# Patient Record
Sex: Female | Born: 1985 | Race: White | Hispanic: No | Marital: Married | Smoking: Never smoker
Health system: Southern US, Community
[De-identification: ages and names within clinical notes are randomized; demographics above are authoritative.]

## PROBLEM LIST (undated history)

## (undated) DIAGNOSIS — A6009 Herpesviral infection of other urogenital tract: Secondary | ICD-10-CM

## (undated) DIAGNOSIS — G43909 Migraine, unspecified, not intractable, without status migrainosus: Secondary | ICD-10-CM

## (undated) DIAGNOSIS — J45909 Unspecified asthma, uncomplicated: Secondary | ICD-10-CM

## (undated) DIAGNOSIS — F32A Depression, unspecified: Secondary | ICD-10-CM

## (undated) DIAGNOSIS — T7840XA Allergy, unspecified, initial encounter: Secondary | ICD-10-CM

## (undated) DIAGNOSIS — D696 Thrombocytopenia, unspecified: Secondary | ICD-10-CM

## (undated) DIAGNOSIS — F329 Major depressive disorder, single episode, unspecified: Secondary | ICD-10-CM

## (undated) DIAGNOSIS — F419 Anxiety disorder, unspecified: Secondary | ICD-10-CM

## (undated) HISTORY — DX: Major depressive disorder, single episode, unspecified: F32.9

## (undated) HISTORY — DX: Allergy, unspecified, initial encounter: T78.40XA

## (undated) HISTORY — DX: Herpesviral infection of other urogenital tract: A60.09

## (undated) HISTORY — DX: Thrombocytopenia, unspecified: D69.6

## (undated) HISTORY — DX: Depression, unspecified: F32.A

## (undated) HISTORY — PX: NO PAST SURGERIES: SHX2092

## (undated) HISTORY — DX: Anxiety disorder, unspecified: F41.9

## (undated) HISTORY — DX: Unspecified asthma, uncomplicated: J45.909

## (undated) HISTORY — DX: Migraine, unspecified, not intractable, without status migrainosus: G43.909

---

## 2008-09-16 DIAGNOSIS — A6009 Herpesviral infection of other urogenital tract: Secondary | ICD-10-CM

## 2008-09-16 HISTORY — DX: Herpesviral infection of other urogenital tract: A60.09

## 2011-11-12 ENCOUNTER — Encounter: Payer: Self-pay | Admitting: Physician Assistant

## 2011-11-12 ENCOUNTER — Ambulatory Visit (INDEPENDENT_AMBULATORY_CARE_PROVIDER_SITE_OTHER): Payer: PRIVATE HEALTH INSURANCE | Admitting: Physician Assistant

## 2011-11-12 VITALS — BP 116/68 | HR 72 | Temp 98.1°F | Ht 62.2 in | Wt 145.0 lb

## 2011-11-12 DIAGNOSIS — N63 Unspecified lump in unspecified breast: Secondary | ICD-10-CM

## 2011-11-12 DIAGNOSIS — Z7689 Persons encountering health services in other specified circumstances: Secondary | ICD-10-CM

## 2011-11-12 DIAGNOSIS — L723 Sebaceous cyst: Secondary | ICD-10-CM

## 2011-11-12 DIAGNOSIS — N6313 Unspecified lump in the right breast, lower outer quadrant: Secondary | ICD-10-CM

## 2011-11-12 NOTE — Progress Notes (Signed)
  Subjective:    Patient ID: Tina Wilson, female    DOB: [birth year], 26 y.o.   MRN: [id number]  HPI Patient presents to establish care today. We reviewed patients history today together. Patient has an history of breast lumps and has had an ultrasound last May 2012 which was normal. They instructed her to follow up if breast lumps change or get bigger in size. She believes that the lump has gotten bigger. She denies any pain associated with lump;however, sometimes she does have sharp pains that go through breast spontaneously.   She also has had an history of boils that have needed excision and drainage. She has had 2 other boils and they have both been painful. Today she has a knot that feels like it might be a boil on left arm and she wants to know if there is anthing she can do about it.    Review of Systems     Objective:   Physical Exam  Constitutional: She is oriented to person, place, and time. She appears well-developed and well-nourished.  HENT:  Head: Normocephalic and atraumatic.  Cardiovascular: Normal rate, regular rhythm and normal heart sounds.   Pulmonary/Chest: Effort normal and breath sounds normal. She has no wheezes.       Breast Exam: Right breast lump at 8 o'clock. Left breast no lumps felt.  Neurological: She is alert and oriented to person, place, and time.  Skin:       3mm deep Knot on the back of left arm small punctate with a small amount of honey colored drainage present. No erythema or signs of infection.  Psychiatric: She has a normal mood and affect. Her behavior is normal.          Assessment & Plan:  Breast lump- Will get a U/S scheduled since mammogram is not indicated in women under 30.   Sebaceous cyst- I suspect this is most likely a sebaceous cyst. I told her to allow to drain and keep extringent over where the drainage is coming out. Could consider warm wash rags over the knot. Try to press or mash cyst if there is any infection. Watch out for  redness, tenderness, and other signs of infection. If continues to grow may need to excise.

## 2011-11-14 NOTE — Patient Instructions (Signed)
Will get ultrasound and follow up with results. Watch for signs of infection with cysts. Consider keep site clean and dry.

## 2011-11-27 ENCOUNTER — Ambulatory Visit
Admission: RE | Admit: 2011-11-27 | Discharge: 2011-11-27 | Disposition: A | Discharge status: Home / Self Care | Payer: PRIVATE HEALTH INSURANCE | Source: Ambulatory Visit | Attending: Physician Assistant | Admitting: Physician Assistant

## 2011-11-27 DIAGNOSIS — N6313 Unspecified lump in the right breast, lower outer quadrant: Secondary | ICD-10-CM

## 2012-06-23 ENCOUNTER — Ambulatory Visit (INDEPENDENT_AMBULATORY_CARE_PROVIDER_SITE_OTHER): Payer: PRIVATE HEALTH INSURANCE | Admitting: Family Medicine

## 2012-06-23 VITALS — BP 108/78 | HR 87 | Temp 98.3°F | Resp 16 | Ht 62.75 in | Wt 146.6 lb

## 2012-06-23 DIAGNOSIS — J209 Acute bronchitis, unspecified: Secondary | ICD-10-CM

## 2012-06-23 MED ORDER — AZITHROMYCIN 250 MG PO TABS: ORAL_TABLET | ORAL | Status: DC

## 2012-06-23 MED ORDER — HYDROCOD POLST-CHLORPHEN POLST 10-8 MG/5ML PO LQCR: 5.0000 mL | Freq: Two times a day (BID) | ORAL | Status: DC | PRN

## 2012-06-23 NOTE — Progress Notes (Signed)
[address]   [place], [place]  [address]   [phone]  Subjective:    Patient ID: Tina Wilson, female    DOB: [birth year], 26 y.o.   MRN: [id number]  HPIThis 26 y.o. female presents for evaluation of sore throat, cough.  Onset two weeks ago.  No fever but +chills/sweats.  +HA with coughing.  +ST; feels like lump in throat; no major throat pain with swallowing.  Intermittent pain with swallowing.  No ear pain.  No rhinorrhea; intermittent nasal congestion.  +coughing a lot; +sputum production white.  +SOB.  Mild wheezing; no history of asthma.  No tobacco.  No v/d.  No rash.  Taking Delsym.  No sneezing; no itchy eyes or nose.  No history of seasonal allergies in  since 2005.   History of pneumonia in college; felt worse then than now.  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  Exercise sometimes.  Condoms sporadically; pull out method.     Review of Systems  Constitutional: Negative for fever, chills, diaphoresis and fatigue.  HENT: Positive for congestion, sore throat, voice change and postnasal drip. Negative for rhinorrhea, sneezing, drooling, mouth sores, trouble swallowing, dental problem and sinus pressure.   Respiratory: Positive for cough, shortness of breath and wheezing. Negative for chest tightness and stridor.   Gastrointestinal: Negative for nausea, vomiting and diarrhea.  Skin: Negative for rash.  Neurological: Positive for headaches.        Past Medical History  Diagnosis Date  . Allergy   . Migraine     No past surgical history on file.  Prior to Admission medications   Medication Sig Start Date End Date Taking? Authorizing Provider  azithromycin (ZITHROMAX Z-PAK) 250 MG tablet 2 tablets daily x 1 day then 1 tablet daily x 4 days 06/23/12   Ethelda Chick, MD    chlorpheniramine-HYDROcodone [facility] PENNKINETIC ER) 10-8 MG/5ML LQCR Take 5 mLs by mouth every 12 (twelve) hours as needed. 06/23/12   Ethelda Chick, MD    No Known Allergies  History   Social History  . Marital Status: Married    Spouse Name: N/A    Number of Children: N/A  . Years of Education: N/A   Occupational History  . Not on file.   Social History Main Topics  . Smoking status: Never Smoker   . Smokeless tobacco: Not on file  . Alcohol Use: 10.4 oz/week    4 Drinks containing 0.5 oz of alcohol, 14 Glasses of wine per week  . Drug Use: No  . Sexually Active: Yes -- Female partner(s)     married.   Other Topics Concern  . Not on file   Social History Narrative   Marital status: married   Children: none   Living with: husband   Employment: works from home   Tobacco: none    Alcohol: 2 glasses wine nightly   Drugs: none   Exercise: sporadically    Family History  Problem Relation Age of Onset  . Depression Mother   . Hypertension Father   . Alcohol abuse Paternal Aunt   . Alcohol abuse Maternal Grandmother   . Stroke Maternal Grandmother   . Heart attack Maternal Grandfather   . Breast cancer Paternal Grandmother   . Alcohol abuse Paternal Grandfather     Objective:   Physical  Exam  Nursing note and vitals reviewed. Constitutional: She is oriented to person, place, and time. She appears well-developed and well-nourished. No distress.  HENT:  Head: Normocephalic and atraumatic.  Right Ear: External ear normal.  Left Ear: External ear normal.  Nose: Nose normal.  Mouth/Throat: Oropharynx is clear and moist. No oropharyngeal exudate.  Eyes: Conjunctivae normal and EOM are normal. Pupils are equal, round, and reactive to light.  Neck: Normal range of motion. Neck supple. No thyromegaly present.  Cardiovascular: Normal rate, regular rhythm and normal heart sounds.   Pulmonary/Chest: Effort normal and breath sounds normal. No respiratory distress. She has no  wheezes. She has no rales.       FREQUENT HACKY COUGH DURING VISIT.  Lymphadenopathy:    She has no cervical adenopathy.  Neurological: She is alert and oriented to person, place, and time.  Skin: No rash noted. She is not diaphoretic.  Psychiatric: She has a normal mood and affect. Her behavior is normal.       Assessment & Plan:   1. Acute bronchitis  azithromycin (ZITHROMAX Z-PAK) 250 MG tablet, chlorpheniramine-HYDROcodone (TUSSIONEX PENNKINETIC ER) 10-8 MG/5ML LQCR     1.  Acute bronchitis: New. Rx for Zpack, Tussionex provided. To use Delsym during the day for cough or recommend trial of Mucinex DM every morning. RTC for acute worsening or no improvement. Supportive care with rest, fluids, Ibuprofen or Tylenol.

## 2012-06-23 NOTE — Patient Instructions (Addendum)
1. Acute bronchitis  azithromycin (ZITHROMAX Z-PAK) 250 MG tablet, chlorpheniramine-HYDROcodone (TUSSIONEX PENNKINETIC ER) 10-8 MG/5ML LQCR   Bronchitis Bronchitis is the body's way of reacting to injury and/or infection (inflammation) of the bronchi. Bronchi are the air tubes that extend from the windpipe into the lungs. If the inflammation becomes severe, it may cause shortness of breath. CAUSES  Inflammation may be caused by:  A virus.  Germs (bacteria).  Dust.  Allergens.  Pollutants and many other irritants. The cells lining the bronchial tree are covered with tiny hairs (cilia). These constantly beat upward, away from the lungs, toward the mouth. This keeps the lungs free of pollutants. When these cells become too irritated and are unable to do their job, mucus begins to develop. This causes the characteristic cough of bronchitis. The cough clears the lungs when the cilia are unable to do their job. Without either of these protective mechanisms, the mucus would settle in the lungs. Then you would develop pneumonia. Smoking is a common cause of bronchitis and can contribute to pneumonia. Stopping this habit is the single most important thing you can do to help yourself. TREATMENT   Your caregiver may prescribe an antibiotic if the cough is caused by bacteria. Also, medicines that open up your airways make it easier to breathe. Your caregiver may also recommend or prescribe an expectorant. It will loosen the mucus to be coughed up. Only take over-the-counter or prescription medicines for pain, discomfort, or fever as directed by your caregiver.  Removing whatever causes the problem (smoking, for example) is critical to preventing the problem from getting worse.  Cough suppressants may be prescribed for relief of cough symptoms.  Inhaled medicines may be prescribed to help with symptoms now and to help prevent problems from returning.  For those with recurrent (chronic) bronchitis,  there may be a need for steroid medicines. SEEK IMMEDIATE MEDICAL CARE IF:   During treatment, you develop more pus-like mucus (purulent sputum).  You have a fever.  Your baby is older than 3 months with a rectal temperature of 102 F (38.9 C) or higher.  Your baby is 32 months old or younger with a rectal temperature of 100.4 F (38 C) or higher.  You become progressively more ill.  You have increased difficulty breathing, wheezing, or shortness of breath. It is necessary to seek immediate medical care if you are elderly or sick from any other disease. MAKE SURE YOU:   Understand these instructions.  Will watch your condition.  Will get help right away if you are not doing well or get worse. Document Released: 09/02/2005 Document Revised: 11/25/2011 Document Reviewed: 07/12/2008 [facility] Patient Information 2013 [place], [place].

## 2012-06-24 NOTE — Progress Notes (Signed)
Reviewed and agree.

## 2012-07-17 ENCOUNTER — Telehealth: Payer: Self-pay | Admitting: Family Medicine

## 2012-07-17 ENCOUNTER — Ambulatory Visit (INDEPENDENT_AMBULATORY_CARE_PROVIDER_SITE_OTHER): Payer: PRIVATE HEALTH INSURANCE | Admitting: Emergency Medicine

## 2012-07-17 VITALS — BP 104/70 | HR 91 | Temp 98.1°F | Resp 16 | Ht 63.0 in | Wt 148.0 lb

## 2012-07-17 DIAGNOSIS — L089 Local infection of the skin and subcutaneous tissue, unspecified: Secondary | ICD-10-CM

## 2012-07-17 DIAGNOSIS — J018 Other acute sinusitis: Secondary | ICD-10-CM

## 2012-07-17 DIAGNOSIS — L723 Sebaceous cyst: Secondary | ICD-10-CM

## 2012-07-17 DIAGNOSIS — J329 Chronic sinusitis, unspecified: Secondary | ICD-10-CM

## 2012-07-17 DIAGNOSIS — J209 Acute bronchitis, unspecified: Secondary | ICD-10-CM

## 2012-07-17 MED ORDER — PSEUDOEPHEDRINE-GUAIFENESIN ER 60-600 MG PO TB12: 1.0000 | ORAL_TABLET | Freq: Two times a day (BID) | ORAL | Status: AC

## 2012-07-17 MED ORDER — HYDROCOD POLST-CHLORPHEN POLST 10-8 MG/5ML PO LQCR: 5.0000 mL | Freq: Two times a day (BID) | ORAL | Status: DC | PRN

## 2012-07-17 MED ORDER — AMOXICILLIN-POT CLAVULANATE 875-125 MG PO TABS: 1.0000 | ORAL_TABLET | Freq: Two times a day (BID) | ORAL | Status: DC

## 2012-07-17 NOTE — Telephone Encounter (Signed)
Pt left Rx here. Called in Tussionex 5mL Q12 hours prn cough. Dispense 60mL. 0 refills to [facility] [address] Eileen Stanford

## 2012-07-17 NOTE — Progress Notes (Signed)
   [address] [place] [address]   Phone [phone]  Subjective:    Patient ID: Tina Wilson, female    DOB: 03-06-1986, 26 y.o.   MRN: [id number]  HPI    Review of Systems     Objective:   Physical Exam  Procedure:  Consent obtained.  Local anesthesia with 2% lido with epi.  #11 blade used to make a 3 cm horizontal incision.  Copious purulent sebaceous material expressed.  1/4 in packing placed into the cavity.  Drsg placed. Wound care d/w pt.        Assessment & Plan:  She will change the drsg daily.  She has an option of either pulling out packing tomorrow or RTC on Monday for packing change.  She can use Motrin for pain.

## 2012-07-17 NOTE — Progress Notes (Signed)
[facility] [address] [place] [address] [phone]  Date:  07/17/2012   Name:  Tina Wilson   DOB:  Nov 29, 1985   MRN:  [id number]  PCP:  Nani Gasser, MD    Chief Complaint: Follow-up and Cyst   History of Present Illness:  Tina Wilson is a 26 y.o. very pleasant female patient who presents with the following:  Seen recently with bronchitis and treated with zpak and tussionex.  Completed medication.  Now still has cough.  No nasal drainage or sore throat.  Has a post nasal drainage.  Cough is mostly nonproductive.  Is worse at night when she lays down.  No shortness of breath or wheezing.    Tender mass on posterior right shoulder at site of prior drainage.  Enlarging and tender.  No fever or chills.  There is no problem list on file for this patient.   Past Medical History  Diagnosis Date  . Allergy   . Migraine     No past surgical history on file.  History  Substance Use Topics  . Smoking status: Never Smoker   . Smokeless tobacco: Not on file  . Alcohol Use: 10.4 oz/week    4 Drinks containing 0.5 oz of alcohol, 14 Glasses of wine per week    Family History  Problem Relation Age of Onset  . Depression Mother   . Hypertension Father   . Alcohol abuse Paternal Aunt   . Alcohol abuse Maternal Grandmother   . Stroke Maternal Grandmother   . Heart attack Maternal Grandfather   . Breast cancer Paternal Grandmother   . Alcohol abuse Paternal Grandfather     No Known Allergies  Medication list has been reviewed and updated.  Current Outpatient Prescriptions on File Prior to Visit  Medication Sig Dispense Refill  . azithromycin (ZITHROMAX Z-PAK) 250 MG tablet 2 tablets daily x 1 day then 1 tablet daily x 4 days  6 each  0  . chlorpheniramine-HYDROcodone (TUSSIONEX PENNKINETIC ER) 10-8 MG/5ML LQCR Take 5 mLs by mouth every 12 (twelve) hours as needed.  115 mL  0    Review of Systems:  As per HPI, otherwise negative.     Physical Examination: Filed Vitals:   07/17/12 1444  BP: 104/70  Pulse: 91  Temp: 98.1 F (36.7 C)  Resp: 16   Filed Vitals:   07/17/12 1444  Height: 5\' 3"  (1.6 m)  Weight: 148 lb (67.132 kg)   Body mass index is 26.22 kg/(m^2). Ideal Body Weight: Weight in (lb) to have BMI = 25: 140.8   GEN: WDWN, NAD, Non-toxic, A & O x 3  No rash or sepsis. HEENT: Atraumatic, Normocephalic. Neck supple. No masses, No LAD. Oropharynx negative Ears and Nose: No external deformity.  TM negative CV: RRR, No M/G/R. No JVD. No thrill. No extra heart sounds. PULM: CTA B, no wheezes, crackles, rhonchi. No retractions. No resp. distress. No accessory muscle use. ABD: S, NT, ND, +BS. No rebound. No HSM. EXTR: No c/c/e NEURO Normal gait.  PSYCH: Normally interactive. Conversant. Not depressed or anxious appearing.  Calm demeanor.    Assessment and Plan: Sinusitis Sebaceous cyst augmentin mucinex d  Surgical removal of cyst  Carmelina Dane, MD

## 2012-07-20 ENCOUNTER — Ambulatory Visit (INDEPENDENT_AMBULATORY_CARE_PROVIDER_SITE_OTHER): Payer: PRIVATE HEALTH INSURANCE | Admitting: Family Medicine

## 2012-07-20 VITALS — BP 118/78 | HR 83 | Temp 98.1°F | Resp 18 | Ht 62.0 in | Wt 138.4 lb

## 2012-07-20 DIAGNOSIS — J45909 Unspecified asthma, uncomplicated: Secondary | ICD-10-CM

## 2012-07-20 DIAGNOSIS — L0291 Cutaneous abscess, unspecified: Secondary | ICD-10-CM

## 2012-07-20 DIAGNOSIS — L0390 Cellulitis, unspecified: Secondary | ICD-10-CM

## 2012-07-20 LAB — WOUND CULTURE
Gram Stain: NONE SEEN
Organism ID, Bacteria: NO GROWTH

## 2012-07-20 NOTE — Progress Notes (Signed)
Is a 26 year old woman who works in [employer] for a [employer] and she's here for followup of a cyst on her right shoulder and chronic cough. She's had no further problems with the cyst and needs to have the packing removed. She still has a chronic cough hasn't changed a bit despite the Z-Pak and Augmentin. She has no sinus symptoms at this time.  Patient's had a history of asthma.  Objective: No acute distress the patient is a dry persistent cough  Chest: Clear Packing removed from cyst on back with clean base evident and no pus. There is no significant tenderness or erythema or induration around the area of I&D.  Assessment: Reactive airways disease, healing sebaceous cyst  Plan: Do r1 100 twice a day, stop the Augmentin

## 2012-10-06 ENCOUNTER — Other Ambulatory Visit: Payer: Self-pay | Admitting: Obstetrics & Gynecology

## 2012-10-06 DIAGNOSIS — N6311 Unspecified lump in the right breast, upper outer quadrant: Secondary | ICD-10-CM

## 2012-10-06 DIAGNOSIS — N6321 Unspecified lump in the left breast, upper outer quadrant: Secondary | ICD-10-CM

## 2012-10-06 DIAGNOSIS — N6325 Unspecified lump in the left breast, overlapping quadrants: Secondary | ICD-10-CM

## 2012-10-07 ENCOUNTER — Other Ambulatory Visit: Payer: PRIVATE HEALTH INSURANCE

## 2012-10-09 ENCOUNTER — Inpatient Hospital Stay: Admission: RE | Admit: 2012-10-09 | Payer: PRIVATE HEALTH INSURANCE | Source: Ambulatory Visit

## 2012-10-26 ENCOUNTER — Ambulatory Visit
Admission: RE | Admit: 2012-10-26 | Discharge: 2012-10-26 | Disposition: A | Discharge status: Home / Self Care | Payer: PRIVATE HEALTH INSURANCE | Source: Ambulatory Visit | Attending: Obstetrics & Gynecology | Admitting: Obstetrics & Gynecology

## 2012-10-26 DIAGNOSIS — N6325 Unspecified lump in the left breast, overlapping quadrants: Secondary | ICD-10-CM

## 2012-10-26 DIAGNOSIS — N6311 Unspecified lump in the right breast, upper outer quadrant: Secondary | ICD-10-CM

## 2012-10-26 DIAGNOSIS — N6321 Unspecified lump in the left breast, upper outer quadrant: Secondary | ICD-10-CM

## 2012-10-29 NOTE — Progress Notes (Signed)
Reviewed and agree.

## 2013-01-11 ENCOUNTER — Ambulatory Visit (INDEPENDENT_AMBULATORY_CARE_PROVIDER_SITE_OTHER): Payer: PRIVATE HEALTH INSURANCE | Admitting: Internal Medicine

## 2013-01-11 ENCOUNTER — Encounter: Payer: Self-pay | Admitting: Internal Medicine

## 2013-01-11 ENCOUNTER — Other Ambulatory Visit (INDEPENDENT_AMBULATORY_CARE_PROVIDER_SITE_OTHER): Payer: PRIVATE HEALTH INSURANCE

## 2013-01-11 VITALS — BP 104/68 | HR 81 | Temp 98.1°F | Resp 16 | Ht 62.0 in | Wt 153.5 lb

## 2013-01-11 DIAGNOSIS — Z0000 Encounter for general adult medical examination without abnormal findings: Secondary | ICD-10-CM

## 2013-01-11 LAB — COMPREHENSIVE METABOLIC PANEL
BUN: 11 mg/dL (ref 6–23)
CO2: 28 mEq/L (ref 19–32)
Calcium: 9 mg/dL (ref 8.4–10.5)
Chloride: 104 mEq/L (ref 96–112)
Creatinine, Ser: 0.6 mg/dL (ref 0.4–1.2)
GFR: 128.7 mL/min (ref >60.00)
Total Bilirubin: 0.7 mg/dL (ref 0.3–1.2)

## 2013-01-11 LAB — LIPID PANEL
Cholesterol: 198 mg/dL (ref 0–200)
HDL: 48.1 mg/dL (ref >39.00)
LDL Cholesterol: 129 mg/dL — ABNORMAL HIGH (ref 0–99)
Triglycerides: 103 mg/dL (ref 0.0–149.0)

## 2013-01-11 LAB — CBC WITH DIFFERENTIAL/PLATELET
Basophils Absolute: 0 10*3/uL (ref 0.0–0.1)
Basophils Relative: 0.5 % (ref 0.0–3.0)
Eosinophils Relative: 1.3 % (ref 0.0–5.0)
HCT: 35.2 % — ABNORMAL LOW (ref 36.0–46.0)
Hemoglobin: 12.5 g/dL (ref 12.0–15.0)
Lymphocytes Relative: 33.4 % (ref 12.0–46.0)
Monocytes Relative: 7.6 % (ref 3.0–12.0)
Neutro Abs: 3.7 10*3/uL (ref 1.4–7.7)
RBC: 3.91 Mil/uL (ref 3.87–5.11)
WBC: 6.5 10*3/uL (ref 4.5–10.5)

## 2013-01-11 NOTE — Patient Instructions (Signed)
Preventive Care for Adults, Female A healthy lifestyle and preventive care can promote health and wellness. Preventive health guidelines for women include the following key practices.  A routine yearly physical is a good way to check with your caregiver about your health and preventive screening. It is a chance to share any concerns and updates on your health, and to receive a thorough exam.  Visit your dentist for a routine exam and preventive care every 6 months. Brush your teeth twice a day and floss once a day. Good oral hygiene prevents tooth decay and gum disease.  The frequency of eye exams is based on your age, health, family medical history, use of contact lenses, and other factors. Follow your caregiver's recommendations for frequency of eye exams.  Eat a healthy diet. Foods like vegetables, fruits, whole grains, low-fat dairy products, and lean protein foods contain the nutrients you need without too many calories. Decrease your intake of foods high in solid fats, added sugars, and salt. Eat the right amount of calories for you.Get information about a proper diet from your caregiver, if necessary.  Regular physical exercise is one of the most important things you can do for your health. Most adults should get at least 150 minutes of moderate-intensity exercise (any activity that increases your heart rate and causes you to sweat) each week. In addition, most adults need muscle-strengthening exercises on 2 or more days a week.  Maintain a healthy weight. The body mass index (BMI) is a screening tool to identify possible weight problems. It provides an estimate of body fat based on height and weight. Your caregiver can help determine your BMI, and can help you achieve or maintain a healthy weight.For adults 20 years and older:  A BMI below 18.5 is considered underweight.  A BMI of 18.5 to 24.9 is normal.  A BMI of 25 to 29.9 is considered overweight.  A BMI of 30 and above is  considered obese.  Maintain normal blood lipids and cholesterol levels by exercising and minimizing your intake of saturated fat. Eat a balanced diet with plenty of fruit and vegetables. Blood tests for lipids and cholesterol should begin at age 20 and be repeated every 5 years. If your lipid or cholesterol levels are high, you are over 50, or you are at high risk for heart disease, you may need your cholesterol levels checked more frequently.Ongoing high lipid and cholesterol levels should be treated with medicines if diet and exercise are not effective.  If you smoke, find out from your caregiver how to quit. If you do not use tobacco, do not start.  If you are pregnant, do not drink alcohol. If you are breastfeeding, be very cautious about drinking alcohol. If you are not pregnant and choose to drink alcohol, do not exceed 1 drink per day. One drink is considered to be 12 ounces (355 mL) of beer, 5 ounces (148 mL) of wine, or 1.5 ounces (44 mL) of liquor.  Avoid use of street drugs. Do not share needles with anyone. Ask for help if you need support or instructions about stopping the use of drugs.  High blood pressure causes heart disease and increases the risk of stroke. Your blood pressure should be checked at least every 1 to 2 years. Ongoing high blood pressure should be treated with medicines if weight loss and exercise are not effective.  If you are 55 to 26 years old, ask your caregiver if you should take aspirin to prevent strokes.  Diabetes   screening involves taking a blood sample to check your fasting blood sugar level. This should be done once every 3 years, after age 45, if you are within normal weight and without risk factors for diabetes. Testing should be considered at a younger age or be carried out more frequently if you are overweight and have at least 1 risk factor for diabetes.  Breast cancer screening is essential preventive care for women. You should practice "breast  self-awareness." This means understanding the normal appearance and feel of your breasts and may include breast self-examination. Any changes detected, no matter how small, should be reported to a caregiver. Women in their 20s and 30s should have a clinical breast exam (CBE) by a caregiver as part of a regular health exam every 1 to 3 years. After age 40, women should have a CBE every year. Starting at age 40, women should consider having a mammography (breast X-ray test) every year. Women who have a family history of breast cancer should talk to their caregiver about genetic screening. Women at a high risk of breast cancer should talk to their caregivers about having magnetic resonance imaging (MRI) and a mammography every year.  The Pap test is a screening test for cervical cancer. A Pap test can show cell changes on the cervix that might become cervical cancer if left untreated. A Pap test is a procedure in which cells are obtained and examined from the lower end of the uterus (cervix).  Women should have a Pap test starting at age 21.  Between ages 21 and 29, Pap tests should be repeated every 2 years.  Beginning at age 30, you should have a Pap test every 3 years as long as the past 3 Pap tests have been normal.  Some women have medical problems that increase the chance of getting cervical cancer. Talk to your caregiver about these problems. It is especially important to talk to your caregiver if a new problem develops soon after your last Pap test. In these cases, your caregiver may recommend more frequent screening and Pap tests.  The above recommendations are the same for women who have or have not gotten the vaccine for human papillomavirus (HPV).  If you had a hysterectomy for a problem that was not cancer or a condition that could lead to cancer, then you no longer need Pap tests. Even if you no longer need a Pap test, a regular exam is a good idea to make sure no other problems are  starting.  If you are between ages 65 and 70, and you have had normal Pap tests going back 10 years, you no longer need Pap tests. Even if you no longer need a Pap test, a regular exam is a good idea to make sure no other problems are starting.  If you have had past treatment for cervical cancer or a condition that could lead to cancer, you need Pap tests and screening for cancer for at least 20 years after your treatment.  If Pap tests have been discontinued, risk factors (such as a new sexual partner) need to be reassessed to determine if screening should be resumed.  The HPV test is an additional test that may be used for cervical cancer screening. The HPV test looks for the virus that can cause the cell changes on the cervix. The cells collected during the Pap test can be tested for HPV. The HPV test could be used to screen women aged 30 years and older, and should   be used in women of any age who have unclear Pap test results. After the age of 30, women should have HPV testing at the same frequency as a Pap test.  Colorectal cancer can be detected and often prevented. Most routine colorectal cancer screening begins at the age of 50 and continues through age 75. However, your caregiver may recommend screening at an earlier age if you have risk factors for colon cancer. On a yearly basis, your caregiver may provide home test kits to check for hidden blood in the stool. Use of a small camera at the end of a tube, to directly examine the colon (sigmoidoscopy or colonoscopy), can detect the earliest forms of colorectal cancer. Talk to your caregiver about this at age 50, when routine screening begins. Direct examination of the colon should be repeated every 5 to 10 years through age 75, unless early forms of pre-cancerous polyps or small growths are found.  Hepatitis C blood testing is recommended for all people born from 1945 through 1965 and any individual with known risks for hepatitis C.  Practice  safe sex. Use condoms and avoid high-risk sexual practices to reduce the spread of sexually transmitted infections (STIs). STIs include gonorrhea, chlamydia, syphilis, trichomonas, herpes, HPV, and human immunodeficiency virus (HIV). Herpes, HIV, and HPV are viral illnesses that have no cure. They can result in disability, cancer, and death. Sexually active women aged 25 and younger should be checked for chlamydia. Older women with new or multiple partners should also be tested for chlamydia. Testing for other STIs is recommended if you are sexually active and at increased risk.  Osteoporosis is a disease in which the bones lose minerals and strength with aging. This can result in serious bone fractures. The risk of osteoporosis can be identified using a bone density scan. Women ages 65 and over and women at risk for fractures or osteoporosis should discuss screening with their caregivers. Ask your caregiver whether you should take a calcium supplement or vitamin D to reduce the rate of osteoporosis.  Menopause can be associated with physical symptoms and risks. Hormone replacement therapy is available to decrease symptoms and risks. You should talk to your caregiver about whether hormone replacement therapy is right for you.  Use sunscreen with sun protection factor (SPF) of 30 or more. Apply sunscreen liberally and repeatedly throughout the day. You should seek shade when your shadow is shorter than you. Protect yourself by wearing long sleeves, pants, a wide-brimmed hat, and sunglasses year round, whenever you are outdoors.  Once a month, do a whole body skin exam, using a mirror to look at the skin on your back. Notify your caregiver of new moles, moles that have irregular borders, moles that are larger than a pencil eraser, or moles that have changed in shape or color.  Stay current with required immunizations.  Influenza. You need a dose every fall (or winter). The composition of the flu vaccine  changes each year, so being vaccinated once is not enough.  Pneumococcal polysaccharide. You need 1 to 2 doses if you smoke cigarettes or if you have certain chronic medical conditions. You need 1 dose at age 65 (or older) if you have never been vaccinated.  Tetanus, diphtheria, pertussis (Tdap, Td). Get 1 dose of Tdap vaccine if you are younger than age 65, are over 65 and have contact with an infant, are a healthcare worker, are pregnant, or simply want to be protected from whooping cough. After that, you need a Td   booster dose every 10 years. Consult your caregiver if you have not had at least 3 tetanus and diphtheria-containing shots sometime in your life or have a deep or dirty wound.  HPV. You need this vaccine if you are a woman age 26 or younger. The vaccine is given in 3 doses over 6 months.  Measles, mumps, rubella (MMR). You need at least 1 dose of MMR if you were born in 1957 or later. You may also need a second dose.  Meningococcal. If you are age 19 to 21 and a first-year college student living in a residence hall, or have one of several medical conditions, you need to get vaccinated against meningococcal disease. You may also need additional booster doses.  Zoster (shingles). If you are age 60 or older, you should get this vaccine.  Varicella (chickenpox). If you have never had chickenpox or you were vaccinated but received only 1 dose, talk to your caregiver to find out if you need this vaccine.  Hepatitis A. You need this vaccine if you have a specific risk factor for hepatitis A virus infection or you simply wish to be protected from this disease. The vaccine is usually given as 2 doses, 6 to 18 months apart.  Hepatitis B. You need this vaccine if you have a specific risk factor for hepatitis B virus infection or you simply wish to be protected from this disease. The vaccine is given in 3 doses, usually over 6 months. Preventive Services / Frequency Ages 19 to 39  Blood  pressure check.** / Every 1 to 2 years.  Lipid and cholesterol check.** / Every 5 years beginning at age 20.  Clinical breast exam.** / Every 3 years for women in their 20s and 30s.  Pap test.** / Every 2 years from ages 21 through 29. Every 3 years starting at age 30 through age 65 or 70 with a history of 3 consecutive normal Pap tests.  HPV screening.** / Every 3 years from ages 30 through ages 65 to 70 with a history of 3 consecutive normal Pap tests.  Hepatitis C blood test.** / For any individual with known risks for hepatitis C.  Skin self-exam. / Monthly.  Influenza immunization.** / Every year.  Pneumococcal polysaccharide immunization.** / 1 to 2 doses if you smoke cigarettes or if you have certain chronic medical conditions.  Tetanus, diphtheria, pertussis (Tdap, Td) immunization. / A one-time dose of Tdap vaccine. After that, you need a Td booster dose every 10 years.  HPV immunization. / 3 doses over 6 months, if you are 26 and younger.  Measles, mumps, rubella (MMR) immunization. / You need at least 1 dose of MMR if you were born in 1957 or later. You may also need a second dose.  Meningococcal immunization. / 1 dose if you are age 19 to 21 and a first-year college student living in a residence hall, or have one of several medical conditions, you need to get vaccinated against meningococcal disease. You may also need additional booster doses.  Varicella immunization.** / Consult your caregiver.  Hepatitis A immunization.** / Consult your caregiver. 2 doses, 6 to 18 months apart.  Hepatitis B immunization.** / Consult your caregiver. 3 doses usually over 6 months. Ages 40 to 64  Blood pressure check.** / Every 1 to 2 years.  Lipid and cholesterol check.** / Every 5 years beginning at age 20.  Clinical breast exam.** / Every year after age 40.  Mammogram.** / Every year beginning at age 40   and continuing for as long as you are in good health. Consult with your  caregiver.  Pap test.** / Every 3 years starting at age 30 through age 65 or 70 with a history of 3 consecutive normal Pap tests.  HPV screening.** / Every 3 years from ages 30 through ages 65 to 70 with a history of 3 consecutive normal Pap tests.  Fecal occult blood test (FOBT) of stool. / Every year beginning at age 50 and continuing until age 75. You may not need to do this test if you get a colonoscopy every 10 years.  Flexible sigmoidoscopy or colonoscopy.** / Every 5 years for a flexible sigmoidoscopy or every 10 years for a colonoscopy beginning at age 50 and continuing until age 75.  Hepatitis C blood test.** / For all people born from 1945 through 1965 and any individual with known risks for hepatitis C.  Skin self-exam. / Monthly.  Influenza immunization.** / Every year.  Pneumococcal polysaccharide immunization.** / 1 to 2 doses if you smoke cigarettes or if you have certain chronic medical conditions.  Tetanus, diphtheria, pertussis (Tdap, Td) immunization.** / A one-time dose of Tdap vaccine. After that, you need a Td booster dose every 10 years.  Measles, mumps, rubella (MMR) immunization. / You need at least 1 dose of MMR if you were born in 1957 or later. You may also need a second dose.  Varicella immunization.** / Consult your caregiver.  Meningococcal immunization.** / Consult your caregiver.  Hepatitis A immunization.** / Consult your caregiver. 2 doses, 6 to 18 months apart.  Hepatitis B immunization.** / Consult your caregiver. 3 doses, usually over 6 months. Ages 65 and over  Blood pressure check.** / Every 1 to 2 years.  Lipid and cholesterol check.** / Every 5 years beginning at age 20.  Clinical breast exam.** / Every year after age 40.  Mammogram.** / Every year beginning at age 40 and continuing for as long as you are in good health. Consult with your caregiver.  Pap test.** / Every 3 years starting at age 30 through age 65 or 70 with a 3  consecutive normal Pap tests. Testing can be stopped between 65 and 70 with 3 consecutive normal Pap tests and no abnormal Pap or HPV tests in the past 10 years.  HPV screening.** / Every 3 years from ages 30 through ages 65 or 70 with a history of 3 consecutive normal Pap tests. Testing can be stopped between 65 and 70 with 3 consecutive normal Pap tests and no abnormal Pap or HPV tests in the past 10 years.  Fecal occult blood test (FOBT) of stool. / Every year beginning at age 50 and continuing until age 75. You may not need to do this test if you get a colonoscopy every 10 years.  Flexible sigmoidoscopy or colonoscopy.** / Every 5 years for a flexible sigmoidoscopy or every 10 years for a colonoscopy beginning at age 50 and continuing until age 75.  Hepatitis C blood test.** / For all people born from 1945 through 1965 and any individual with known risks for hepatitis C.  Osteoporosis screening.** / A one-time screening for women ages 65 and over and women at risk for fractures or osteoporosis.  Skin self-exam. / Monthly.  Influenza immunization.** / Every year.  Pneumococcal polysaccharide immunization.** / 1 dose at age 65 (or older) if you have never been vaccinated.  Tetanus, diphtheria, pertussis (Tdap, Td) immunization. / A one-time dose of Tdap vaccine if you are over   65 and have contact with an infant, are a healthcare worker, or simply want to be protected from whooping cough. After that, you need a Td booster dose every 10 years.  Varicella immunization.** / Consult your caregiver.  Meningococcal immunization.** / Consult your caregiver.  Hepatitis A immunization.** / Consult your caregiver. 2 doses, 6 to 18 months apart.  Hepatitis B immunization.** / Check with your caregiver. 3 doses, usually over 6 months. ** Family history and personal history of risk and conditions may change your caregiver's recommendations. Document Released: 10/29/2001 Document Revised: 11/25/2011  Document Reviewed: 01/28/2011 [employer] Patient Information 2013 [employer].  

## 2013-01-11 NOTE — Assessment & Plan Note (Signed)
Exam done Vaccines were reviewed Labs ordered Pt ed material was given 

## 2013-01-11 NOTE — Progress Notes (Signed)
  Subjective:    Patient ID: Tina Wilson, female    DOB: 10-19-1985, 27 y.o.   MRN: [id number]  HPI  New to me for a physical - she feels well and offers no complaints.  Review of Systems  Constitutional: Negative.   HENT: Negative.   Eyes: Negative.   Respiratory: Negative.   Cardiovascular: Negative.   Gastrointestinal: Negative.   Endocrine: Negative.   Genitourinary: Negative.   Musculoskeletal: Negative.   Skin: Negative.   Allergic/Immunologic: Negative.   Neurological: Negative.   Hematological: Negative.   Psychiatric/Behavioral: Negative.        Objective:   Physical Exam  Vitals reviewed. Constitutional: She is oriented to person, place, and time. She appears well-developed and well-nourished. No distress.  HENT:  Head: Normocephalic and atraumatic.  Mouth/Throat: Oropharynx is clear and moist. No oropharyngeal exudate.  Eyes: Conjunctivae are normal. Right eye exhibits no discharge. Left eye exhibits no discharge. No scleral icterus.  Neck: Normal range of motion. Neck supple. No JVD present. No tracheal deviation present. No thyromegaly present.  Cardiovascular: Normal rate, regular rhythm, normal heart sounds and intact distal pulses.  Exam reveals no gallop and no friction rub.   No murmur heard. Pulmonary/Chest: Effort normal and breath sounds normal. No stridor. No respiratory distress. She has no wheezes. She has no rales. She exhibits no tenderness.  Abdominal: Soft. Bowel sounds are normal. She exhibits no distension and no mass. There is no tenderness. There is no rebound and no guarding.  Musculoskeletal: Normal range of motion. She exhibits no edema and no tenderness.  Lymphadenopathy:    She has no cervical adenopathy.  Neurological: She is oriented to person, place, and time.  Skin: Skin is warm and dry. No rash noted. She is not diaphoretic. No erythema. No pallor.  Psychiatric: She has a normal mood and affect. Her behavior is normal. Judgment and  thought content normal.          Assessment & Plan:

## 2015-07-14 ENCOUNTER — Other Ambulatory Visit: Payer: Self-pay | Admitting: Obstetrics and Gynecology

## 2015-07-14 DIAGNOSIS — N63 Unspecified lump in unspecified breast: Secondary | ICD-10-CM

## 2015-07-20 ENCOUNTER — Ambulatory Visit
Admission: RE | Admit: 2015-07-20 | Discharge: 2015-07-20 | Disposition: A | Discharge status: Home / Self Care | Payer: PRIVATE HEALTH INSURANCE | Source: Ambulatory Visit | Attending: Obstetrics and Gynecology | Admitting: Obstetrics and Gynecology

## 2015-07-20 DIAGNOSIS — N63 Unspecified lump in unspecified breast: Secondary | ICD-10-CM

## 2016-03-15 LAB — OB RESULTS CONSOLE ANTIBODY SCREEN: ANTIBODY SCREEN: NEGATIVE

## 2016-03-15 LAB — OB RESULTS CONSOLE RPR: RPR: NONREACTIVE

## 2016-03-15 LAB — OB RESULTS CONSOLE RUBELLA ANTIBODY, IGM: RUBELLA: IMMUNE

## 2016-03-15 LAB — OB RESULTS CONSOLE HEPATITIS B SURFACE ANTIGEN: Hepatitis B Surface Ag: NEGATIVE

## 2016-03-15 LAB — OB RESULTS CONSOLE ABO/RH: RH Type: POSITIVE

## 2016-03-15 LAB — OB RESULTS CONSOLE HIV ANTIBODY (ROUTINE TESTING): HIV: NONREACTIVE

## 2016-03-15 LAB — OB RESULTS CONSOLE GC/CHLAMYDIA
CHLAMYDIA, DNA PROBE: NEGATIVE
GC PROBE AMP, GENITAL: NEGATIVE

## 2016-10-22 ENCOUNTER — Inpatient Hospital Stay (HOSPITAL_COMMUNITY): Admission: AD | Admit: 2016-10-22 | Payer: 59 | Source: Ambulatory Visit | Admitting: Obstetrics and Gynecology

## 2016-10-28 ENCOUNTER — Encounter (HOSPITAL_COMMUNITY): Payer: Self-pay | Admitting: *Deleted

## 2016-10-28 ENCOUNTER — Telehealth (HOSPITAL_COMMUNITY): Payer: Self-pay | Admitting: *Deleted

## 2016-10-28 LAB — OB RESULTS CONSOLE GBS: STREP GROUP B AG: NEGATIVE

## 2016-10-28 NOTE — Telephone Encounter (Signed)
Preadmission screen  

## 2016-10-29 NOTE — H&P (Signed)
Tina Wilson is a 31 y.o. female presenting for post dates IOL. U/S in office 10/28/16 showed vertex, EFW 7# 13 oz, AFI 6.5 and BPP 8/8. OB History    Gravida Para Term Preterm AB Living   1             SAB TAB Ectopic Multiple Live Births                 Past Medical History:  Diagnosis Date  . Allergy   . Anxiety   . Asthma   . Depression   . Herpes genitalis in women 2010  . Migraine    Past Surgical History:  Procedure Laterality Date  . NO PAST SURGERIES     Family History: family history includes Alcohol abuse in her maternal grandmother, paternal aunt, and paternal grandfather; Arthritis in her mother; Breast cancer in her paternal grandmother; Depression in her mother; Heart attack in her maternal grandfather; Hyperlipidemia in her father; Hypertension in her father; Stroke in her maternal grandmother. Social History:  reports that she quit smoking about 8 years ago. She has never used smokeless tobacco. She reports that she drinks about 3.0 oz of alcohol per week . She reports that she does not use drugs.     Maternal Diabetes: No Genetic Screening: Normal Maternal Ultrasounds/Referrals: Normal Fetal Ultrasounds or other Referrals:  None Maternal Substance Abuse:  No Significant Maternal Medications:  None Significant Maternal Lab Results:  None Other Comments:  None  ROS History   There were no vitals taken for this visit. Maternal Exam:  Abdomen: Fetal presentation: vertex     Physical Exam  Cardiovascular: Normal rate.   Respiratory: Effort normal.  GI: Soft.    Prenatal labs: ABO, Rh: O/Positive/-- (06/30 0000) Antibody: Negative (06/30 0000) Rubella: Immune (06/30 0000) RPR: Nonreactive (06/30 0000)  HBsAg: Negative (06/30 0000)  HIV: Non-reactive (06/30 0000)  GBS: Negative (02/12 0000)   Assessment/Plan: 31 yo G1P0 for two stage inductio of labor Induction D/W patient in office   Tina Wilson II,Tina Wilson 10/29/2016, 1:56 PM

## 2016-10-30 ENCOUNTER — Inpatient Hospital Stay (HOSPITAL_COMMUNITY): Payer: BLUE CROSS/BLUE SHIELD | Admitting: Anesthesiology

## 2016-10-30 ENCOUNTER — Encounter (HOSPITAL_COMMUNITY): Payer: Self-pay

## 2016-10-30 ENCOUNTER — Inpatient Hospital Stay (HOSPITAL_COMMUNITY)
Admission: RE | Admit: 2016-10-30 | Discharge: 2016-11-01 | DRG: 775 | Disposition: A | Discharge status: Home / Self Care | Payer: BLUE CROSS/BLUE SHIELD | Source: Ambulatory Visit | Attending: Obstetrics and Gynecology | Admitting: Obstetrics and Gynecology

## 2016-10-30 DIAGNOSIS — Z87891 Personal history of nicotine dependence: Secondary | ICD-10-CM

## 2016-10-30 DIAGNOSIS — Z823 Family history of stroke: Secondary | ICD-10-CM | POA: Diagnosis not present

## 2016-10-30 DIAGNOSIS — Z3A41 41 weeks gestation of pregnancy: Secondary | ICD-10-CM

## 2016-10-30 DIAGNOSIS — Z8249 Family history of ischemic heart disease and other diseases of the circulatory system: Secondary | ICD-10-CM | POA: Diagnosis not present

## 2016-10-30 DIAGNOSIS — O480 Post-term pregnancy: Secondary | ICD-10-CM | POA: Diagnosis present

## 2016-10-30 LAB — CBC
HCT: 35.5 % — ABNORMAL LOW (ref 36.0–46.0)
HEMOGLOBIN: 12.4 g/dL (ref 12.0–15.0)
MCH: 32 pg (ref 26.0–34.0)
MCHC: 34.9 g/dL (ref 30.0–36.0)
MCV: 91.5 fL (ref 78.0–100.0)
PLATELETS: 139 10*3/uL — AB (ref 150–400)
RBC: 3.88 MIL/uL (ref 3.87–5.11)
RDW: 14 % (ref 11.5–15.5)
WBC: 10.9 10*3/uL — ABNORMAL HIGH (ref 4.0–10.5)

## 2016-10-30 LAB — TYPE AND SCREEN
ABO/RH(D): O POS
Antibody Screen: NEGATIVE

## 2016-10-30 LAB — POCT FERN TEST: POCT FERN TEST: POSITIVE

## 2016-10-30 LAB — ABO/RH: ABO/RH(D): O POS

## 2016-10-30 LAB — RPR: RPR: NONREACTIVE

## 2016-10-30 MED ORDER — EPHEDRINE 5 MG/ML INJ
10.0000 mg | INTRAVENOUS | Status: DC | PRN
  Filled 2016-10-30: qty 4

## 2016-10-30 MED ORDER — SOD CITRATE-CITRIC ACID 500-334 MG/5ML PO SOLN: 30.0000 mL | ORAL | Status: DC | PRN

## 2016-10-30 MED ORDER — OXYTOCIN 40 UNITS IN LACTATED RINGERS INFUSION - SIMPLE MED: 2.5000 [IU]/h | INTRAVENOUS | Status: DC

## 2016-10-30 MED ORDER — LACTATED RINGERS IV SOLN: 500.0000 mL | INTRAVENOUS | Status: DC | PRN

## 2016-10-30 MED ORDER — LIDOCAINE HCL (PF) 1 % IJ SOLN
30.0000 mL | INTRAMUSCULAR | Status: DC | PRN
  Filled 2016-10-30: qty 30

## 2016-10-30 MED ORDER — TERBUTALINE SULFATE 1 MG/ML IJ SOLN
0.2500 mg | Freq: Once | INTRAMUSCULAR | Status: DC | PRN
  Filled 2016-10-30: qty 1

## 2016-10-30 MED ORDER — FLEET ENEMA 7-19 GM/118ML RE ENEM: 1.0000 | ENEMA | RECTAL | Status: DC | PRN

## 2016-10-30 MED ORDER — OXYCODONE-ACETAMINOPHEN 5-325 MG PO TABS: 1.0000 | ORAL_TABLET | ORAL | Status: DC | PRN

## 2016-10-30 MED ORDER — LACTATED RINGERS IV SOLN
500.0000 mL | Freq: Once | INTRAVENOUS | Status: AC
  Administered 2016-10-30: 500 mL via INTRAVENOUS

## 2016-10-30 MED ORDER — OXYTOCIN 40 UNITS IN LACTATED RINGERS INFUSION - SIMPLE MED
1.0000 m[IU]/min | INTRAVENOUS | Status: DC
  Administered 2016-10-30: 2 m[IU]/min via INTRAVENOUS
  Filled 2016-10-30: qty 1000

## 2016-10-30 MED ORDER — BUTORPHANOL TARTRATE 1 MG/ML IJ SOLN
2.0000 mg | Freq: Once | INTRAMUSCULAR | Status: AC
  Administered 2016-10-30: 2 mg via INTRAVENOUS
  Filled 2016-10-30: qty 2

## 2016-10-30 MED ORDER — TERBUTALINE SULFATE 1 MG/ML IJ SOLN: 0.2500 mg | Freq: Once | INTRAMUSCULAR | Status: DC | PRN

## 2016-10-30 MED ORDER — OXYCODONE-ACETAMINOPHEN 5-325 MG PO TABS: 2.0000 | ORAL_TABLET | ORAL | Status: DC | PRN

## 2016-10-30 MED ORDER — SODIUM BICARBONATE 8.4 % IV SOLN: INTRAVENOUS | Status: DC | PRN

## 2016-10-30 MED ORDER — FENTANYL 2.5 MCG/ML BUPIVACAINE 1/10 % EPIDURAL INFUSION (WH - ANES)
14.0000 mL/h | INTRAMUSCULAR | Status: DC | PRN
  Administered 2016-10-30 (×4): 14 mL/h via EPIDURAL
  Filled 2016-10-30 (×2): qty 100

## 2016-10-30 MED ORDER — OXYTOCIN BOLUS FROM INFUSION
500.0000 mL | Freq: Once | INTRAVENOUS | Status: AC
  Administered 2016-10-30: 500 mL via INTRAVENOUS

## 2016-10-30 MED ORDER — LIDOCAINE HCL (PF) 1 % IJ SOLN
INTRAMUSCULAR | Status: DC | PRN
  Administered 2016-10-30: 6 mL via EPIDURAL
  Administered 2016-10-30: 4 mL

## 2016-10-30 MED ORDER — ZOLPIDEM TARTRATE 5 MG PO TABS
5.0000 mg | ORAL_TABLET | Freq: Every evening | ORAL | Status: DC | PRN
  Administered 2016-10-30: 5 mg via ORAL
  Filled 2016-10-30: qty 1

## 2016-10-30 MED ORDER — ONDANSETRON HCL 4 MG/2ML IJ SOLN
4.0000 mg | Freq: Four times a day (QID) | INTRAMUSCULAR | Status: DC | PRN
  Administered 2016-10-30 (×2): 4 mg via INTRAVENOUS
  Filled 2016-10-30 (×2): qty 2

## 2016-10-30 MED ORDER — PHENYLEPHRINE 40 MCG/ML (10ML) SYRINGE FOR IV PUSH (FOR BLOOD PRESSURE SUPPORT)
80.0000 ug | PREFILLED_SYRINGE | INTRAVENOUS | Status: DC | PRN
  Filled 2016-10-30: qty 5

## 2016-10-30 MED ORDER — LACTATED RINGERS IV SOLN
INTRAVENOUS | Status: DC
  Administered 2016-10-30 (×2): via INTRAVENOUS

## 2016-10-30 MED ORDER — ACETAMINOPHEN 325 MG PO TABS: 650.0000 mg | ORAL_TABLET | ORAL | Status: DC | PRN

## 2016-10-30 MED ORDER — METHYLERGONOVINE MALEATE 0.2 MG/ML IJ SOLN
INTRAMUSCULAR | Status: AC
  Administered 2016-10-30: 0.2 mg
  Filled 2016-10-30: qty 1

## 2016-10-30 MED ORDER — MISOPROSTOL 25 MCG QUARTER TABLET
25.0000 ug | ORAL_TABLET | ORAL | Status: DC | PRN
  Administered 2016-10-30: 25 ug via VAGINAL
  Filled 2016-10-30: qty 0.25
  Filled 2016-10-30: qty 1

## 2016-10-30 MED ORDER — DIPHENHYDRAMINE HCL 50 MG/ML IJ SOLN: 12.5000 mg | INTRAMUSCULAR | Status: DC | PRN

## 2016-10-30 NOTE — Progress Notes (Signed)
9/C/-1 FHT cat one, good response to scalp stim

## 2016-10-30 NOTE — Progress Notes (Signed)
Operative Delivery Note At 10:17 PM a viable female was delivered via Vaginal, Vacuum Neurosurgeon).  Presentation: vertex; Position: Left,, Occiput,, Anterior; Station: +4. Pushing with good effort, beginning to crown. Fetal tachycardia 160-170s noted and patient afebrile. Patient tired. D/W VE. Verbal consent: obtained from patient.  Risks and benefits discussed in detail.  Risks include, but are not limited to the risks of anesthesia, bleeding, infection, damage to maternal tissues, fetal cephalhematoma.  There is also the risk of inability to effect vaginal delivery of the head, or shoulder dystocia that cannot be resolved by established maneuvers, leading to the need for emergency cesarean section. Mushroom VE, 1 popoff. Delivery over second degree MLE. Nuchal cord x 1. Shoulder dystocia <30 seconds, McRoberts position, suprapubic pressure, mild traction. APGAR:6 , 8; weight pending  .   Placenta status:intact , .   Cord:  3 vessels with the following complications: .  Cord pH: pending  Anesthesia:   Instruments: mushroom VE Episiotomy:  Second degree MLE repaired Lacerations:   Suture Repair: 3.0 vicryl rapide Est. Blood Loss (mL):  400 Methergine IM x 1  Mom to postpartum.  Baby to Couplet care / Skin to Skin.  Tina Wilson,Tiesha Marich E 10/30/2016, 10:36 PM

## 2016-10-30 NOTE — Progress Notes (Signed)
Cx 2/C/-1 FHT cat one UCs q2-3 min IUPC placed Epidural in

## 2016-10-30 NOTE — Progress Notes (Signed)
Cx 5/C/-1 FHT cat one

## 2016-10-30 NOTE — Anesthesia Pain Management Evaluation Note (Signed)
  CRNA Pain Management Visit Note  Patient: Tina Wilson, 31 y.o., female  "Hello I am a member of the anesthesia team at [facility]. We have an anesthesia team available at all times to provide care throughout the hospital, including epidural management and anesthesia for C-section. I don't know your plan for the delivery whether it a natural birth, water birth, IV sedation, nitrous supplementation, doula or epidural, but we want to meet your pain goals."   1.Was your pain managed to your expectations on prior hospitalizations?   No prior hospitalizations  2.What is your expectation for pain management during this hospitalization?     Epidural  3.How can we help you reach that goal?   Record the patient's initial score and the patient's pain goal.   Pain: 0  Pain Goal: 8 [facility] wants you to be able to say your pain was always managed very well.  Jabier Mutton 10/30/2016

## 2016-10-30 NOTE — Progress Notes (Signed)
FHT cat one UCs q2-3 mn Cx 2/C/-2

## 2016-10-30 NOTE — Progress Notes (Signed)
cx 6/C/-1 FHT cat one, good response to scalp stim

## 2016-10-30 NOTE — Progress Notes (Signed)
C/O N/V - patient reports long history of HA and N/V  FHT + accels, some variable decels UCs MVU 190 Cx 4/puffy/-2/some caput  D/W patient and husband above Will medicate nause Recheck about 1 hour

## 2016-10-30 NOTE — Anesthesia Procedure Notes (Signed)
Epidural Patient location during procedure: OB  Staffing Anesthesiologist: Freida Nebel  Preanesthetic Checklist Completed: patient identified, site marked, surgical consent, pre-op evaluation, timeout performed, IV checked, risks and benefits discussed and monitors and equipment checked  Epidural Patient position: sitting Prep: site prepped and draped and DuraPrep Patient monitoring: continuous pulse ox and blood pressure Approach: midline Location: L3-L4 Injection technique: LOR air  Needle:  Needle type: Tuohy  Needle gauge: 17 G Needle length: 9 cm and 9 Needle insertion depth: 5 cm cm Catheter type: closed end flexible Catheter size: 19 Gauge Catheter at skin depth: 10 cm Test dose: negative  Assessment Events: blood not aspirated, injection not painful, no injection resistance, negative IV test and no paresthesia  Additional Notes Dosing of Epidural:  1st dose, through catheter .............................................  Xylocaine 40 mg  2nd dose, through catheter, after waiting 3 minutes.........Xylocaine 60 mg    As each dose occurred, patient was free of IV sx; and patient exhibited no evidence of SA injection.  Patient is more comfortable after epidural dosed. Please see RN's note for documentation of vital signs,and FHR which are stable.  Patient reminded not to try to ambulate with numb legs, and that an RN must be present when she attempts to get up.        

## 2016-10-30 NOTE — Anesthesia Preprocedure Evaluation (Signed)

## 2016-10-31 LAB — CBC
HEMATOCRIT: 29.7 % — AB (ref 36.0–46.0)
Hemoglobin: 10.8 g/dL — ABNORMAL LOW (ref 12.0–15.0)
MCH: 33 pg (ref 26.0–34.0)
MCHC: 36.4 g/dL — ABNORMAL HIGH (ref 30.0–36.0)
MCV: 90.8 fL (ref 78.0–100.0)
PLATELETS: 125 10*3/uL — AB (ref 150–400)
RBC: 3.27 MIL/uL — ABNORMAL LOW (ref 3.87–5.11)
RDW: 14.2 % (ref 11.5–15.5)
WBC: 19.8 10*3/uL — ABNORMAL HIGH (ref 4.0–10.5)

## 2016-10-31 MED ORDER — TETANUS-DIPHTH-ACELL PERTUSSIS 5-2.5-18.5 LF-MCG/0.5 IM SUSP: 0.5000 mL | Freq: Once | INTRAMUSCULAR | Status: DC

## 2016-10-31 MED ORDER — OXYCODONE HCL 5 MG PO TABS: 10.0000 mg | ORAL_TABLET | ORAL | Status: DC | PRN

## 2016-10-31 MED ORDER — WITCH HAZEL-GLYCERIN EX PADS
1.0000 "application " | MEDICATED_PAD | CUTANEOUS | Status: DC | PRN
  Administered 2016-10-31: 1 via TOPICAL

## 2016-10-31 MED ORDER — PRENATAL MULTIVITAMIN CH
1.0000 | ORAL_TABLET | Freq: Every day | ORAL | Status: DC
  Administered 2016-10-31 – 2016-11-01 (×2): 1 via ORAL
  Filled 2016-10-31 (×2): qty 1

## 2016-10-31 MED ORDER — SENNOSIDES-DOCUSATE SODIUM 8.6-50 MG PO TABS
2.0000 | ORAL_TABLET | ORAL | Status: DC
  Administered 2016-11-01: 2 via ORAL
  Filled 2016-10-31: qty 2

## 2016-10-31 MED ORDER — ONDANSETRON HCL 4 MG/2ML IJ SOLN: 4.0000 mg | INTRAMUSCULAR | Status: DC | PRN

## 2016-10-31 MED ORDER — DIBUCAINE 1 % RE OINT
1.0000 "application " | TOPICAL_OINTMENT | RECTAL | Status: DC | PRN
  Administered 2016-10-31: 1 via RECTAL
  Filled 2016-10-31: qty 28

## 2016-10-31 MED ORDER — IBUPROFEN 600 MG PO TABS
600.0000 mg | ORAL_TABLET | Freq: Four times a day (QID) | ORAL | Status: DC
  Administered 2016-10-31 – 2016-11-01 (×6): 600 mg via ORAL
  Filled 2016-10-31 (×7): qty 1

## 2016-10-31 MED ORDER — ACETAMINOPHEN 325 MG PO TABS
650.0000 mg | ORAL_TABLET | ORAL | Status: DC | PRN
  Administered 2016-10-31: 650 mg via ORAL
  Filled 2016-10-31 (×2): qty 2

## 2016-10-31 MED ORDER — ZOLPIDEM TARTRATE 5 MG PO TABS: 5.0000 mg | ORAL_TABLET | Freq: Every evening | ORAL | Status: DC | PRN

## 2016-10-31 MED ORDER — DIPHENHYDRAMINE HCL 25 MG PO CAPS: 25.0000 mg | ORAL_CAPSULE | Freq: Four times a day (QID) | ORAL | Status: DC | PRN

## 2016-10-31 MED ORDER — OXYCODONE HCL 5 MG PO TABS: 5.0000 mg | ORAL_TABLET | ORAL | Status: DC | PRN

## 2016-10-31 MED ORDER — COCONUT OIL OIL: 1.0000 "application " | TOPICAL_OIL | Status: DC | PRN

## 2016-10-31 MED ORDER — SIMETHICONE 80 MG PO CHEW: 80.0000 mg | CHEWABLE_TABLET | ORAL | Status: DC | PRN

## 2016-10-31 MED ORDER — ONDANSETRON HCL 4 MG PO TABS: 4.0000 mg | ORAL_TABLET | ORAL | Status: DC | PRN

## 2016-10-31 MED ORDER — BENZOCAINE-MENTHOL 20-0.5 % EX AERO
1.0000 "application " | INHALATION_SPRAY | CUTANEOUS | Status: DC | PRN
  Administered 2016-10-31: 1 via TOPICAL
  Filled 2016-10-31: qty 56

## 2016-10-31 NOTE — Lactation Note (Signed)
This note was copied from a baby's chart. Lactation Consultation Note  Patient Name: Tina Wilson [id number] Date: 10/31/2016 Reason for consult: Follow-up assessment;Difficult latch   Follow up with mom at her request. Infant quietly alert STS with FOB. Parents report they attempted to feed infant and she would not latch.   Assisted mom in latching infant to right breast in the cross cradle hold. Infant latched with flanged lips, rhythmic suckles and frequent swallows. Infant fed for 5 minutes, burped and then relatched again for another 5 minutes. Infant STS with mom post BF. Mom reports no pain/pinching with feeding, nipple rounded when infant came off.   Showed parents teacup hold and enc dad to assist mom with latch. Enc parents to call out for feeding assistance as needed.    Maternal Data Formula Feeding for Exclusion: No Has patient been taught Hand Expression?: Yes Does the patient have breastfeeding experience prior to this delivery?: No  Feeding Feeding Type: Breast Fed Length of feed: 10 min  LATCH Score/Interventions Latch: Grasps breast easily, tongue down, lips flanged, rhythmical sucking. Intervention(s): Adjust position;Assist with latch;Breast massage;Breast compression  Audible Swallowing: Spontaneous and intermittent Intervention(s): Alternate breast massage;Hand expression;Skin to skin  Type of Nipple: Everted at rest and after stimulation (short shaft)  Comfort (Breast/Nipple): Filling, red/small blisters or bruises, mild/mod discomfort  Problem noted: Mild/Moderate discomfort Interventions  (Cracked/bleeding/bruising/blister): Expressed breast milk to nipple Interventions (Mild/moderate discomfort): Hand expression (deepen latch)  Hold (Positioning): Assistance needed to correctly position infant at breast and maintain latch. Intervention(s): Skin to skin;Position options;Support Pillows;Breastfeeding basics reviewed  LATCH Score:  8  Lactation Tools Discussed/Used WIC Program: No   Consult Status Consult Status: Follow-up Date: 11/01/16 Follow-up type: In-patient    Debby Freiberg Karyl Sharrar 10/31/2016, 2:11 PM

## 2016-10-31 NOTE — Anesthesia Postprocedure Evaluation (Signed)
Anesthesia Post Note  Patient: Tina Wilson  Procedure(s) Performed: * No procedures listed *  Patient location during evaluation: Mother Baby Anesthesia Type: Epidural Level of consciousness: awake and alert and oriented Pain management: satisfactory to patient Vital Signs Assessment: post-procedure vital signs reviewed and stable Respiratory status: spontaneous breathing and nonlabored ventilation Cardiovascular status: stable Postop Assessment: no headache, no backache, no signs of nausea or vomiting, adequate PO intake and patient able to bend at knees (patient up walking) Anesthetic complications: no        Last Vitals:  Vitals:   10/31/16 0218 10/31/16 0537  BP:  108/64  Pulse:  92  Resp:  18  Temp: 36.8 C 36.4 C    Last Pain:  Vitals:   10/31/16 0537  TempSrc: Oral  PainSc: 1    Pain Goal:                 Willa Rough

## 2016-10-31 NOTE — Lactation Note (Signed)
This note was copied from a baby's chart. Lactation Consultation Note  Patient Name: Tina Wilson [id number] Date: 10/31/2016 Reason for consult: Initial assessment;Breast/nipple pain   Initial consult with first time mom of 71 hour old infant. Mom reports she is having trouble getting infant to latch and that infant will only feed for short periods of time and will not relatch. Infant STS with mom and quietly alert.  Mom with large compressible breasts and semi compressible areola with short shaft everted nipples. Colostrum very easily expressible, Mom able to hand express colostrum independently. Faint positional stripes noted to both nipples. Assisted mom in latching infant to left breast in the cross cradle hold. Infant did not open mouth very wide and was difficult to latch. She did latch and was noted to be pinching mom, removed her from the breast and nipple was slightly compressed. Infant is noted to have a high palate, she extended her tongue well. She did not suckle well on a gloved finger. Infant would not relatch to breast. Hand expressed and spoon fed infant 3 cc colostrum, at the end of the feeding she was extending tongue and lapping up colostrum, She was not interested in relatching.  Enc mom to keep infant STS and to offer breast 8-12 x in 24 hours at first feeding cues. Enc mom to hand express and spoon feed infant colostrum if infant will not latch. Parents were shown how to spoon feed infant. Mom applied EBM to breast post BF. Enc parents to call out for feeding assistance as needed. Parents are maintaining feeding log. Infant has stooled and not voided since birth.  BF Resources Handout and [employer]ure given, informed of IP/OP Services, BF Support Groups and [place] phone #. Report to Lady Deutscher, RN and Inocente Salles, RN, IBCLC.     Maternal Data Formula Feeding for Exclusion: No Has patient been taught Hand Expression?: Yes Does the patient have breastfeeding  experience prior to this delivery?: No  Feeding Feeding Type: Breast Fed Length of feed: 5 min  LATCH Score/Interventions Latch: Repeated attempts needed to sustain latch, nipple held in mouth throughout feeding, stimulation needed to elicit sucking reflex. Intervention(s): Adjust position;Assist with latch;Breast massage;Breast compression  Audible Swallowing: A few with stimulation Intervention(s): Hand expression;Alternate breast massage;Skin to skin  Type of Nipple: Everted at rest and after stimulation  Comfort (Breast/Nipple): Filling, red/small blisters or bruises, mild/mod discomfort  Problem noted: Mild/Moderate discomfort;Cracked, bleeding, blisters, bruises Interventions  (Cracked/bleeding/bruising/blister): Expressed breast milk to nipple Interventions (Mild/moderate discomfort): Hand expression (deepen latch)  Hold (Positioning): Assistance needed to correctly position infant at breast and maintain latch. Intervention(s): Breastfeeding basics reviewed;Support Pillows;Position options;Skin to skin  LATCH Score: 6  Lactation Tools Discussed/Used WIC Program: No   Consult Status Consult Status: Follow-up Date: 11/01/16 Follow-up type: In-patient    Debby Freiberg Kasper Mudrick 10/31/2016, 11:09 AM

## 2016-10-31 NOTE — Progress Notes (Signed)
Post Partum Day 1 Subjective: no complaints, up ad lib, voiding and tolerating PO  Objective: Blood pressure 108/64, pulse 92, temperature 97.5 F (36.4 C), temperature source Oral, resp. rate 18, height 5\' 2"  (1.575 m), weight 175 lb (79.4 kg), last menstrual period 10/22/2016, SpO2 100 %, unknown if currently breastfeeding.  Physical Exam:  General: alert and cooperative Lochia: appropriate Uterine Fundus: firm Incision: healing well DVT Evaluation: No evidence of DVT seen on physical exam. Negative Homan's sign. No cords or calf tenderness. No significant calf/ankle edema.   Recent Labs  10/30/16 0059 10/31/16 0605  HGB 12.4 10.8*  HCT 35.5* 29.7*    Assessment/Plan: Plan for discharge tomorrow and Breastfeeding   LOS: 1 day   CURTIS,CAROL G 10/31/2016, 7:57 AM

## 2016-11-01 NOTE — Plan of Care (Signed)
Problem: Education: Goal: Knowledge of condition will improve Outcome: Completed/Met Date Met: 11/01/16 Spoke with mother about breast care, latching the infant, hand expression, and positioning the infant for feedings.

## 2016-11-01 NOTE — Lactation Note (Signed)
This note was copied from a baby's chart. Lactation Consultation Note  Patient Name: Tina Wilson [id number] Date: 11/01/2016 Reason for consult: Follow-up assessment Baby cluster feeding this am. Mom has baby latched in cradle position and baby does not have good depth. Worked with Mom with positioning to obtain more depth. Baby demonstrated good suckling bursts with swallowing motions noted. Mom reported stronger "tugging" at breast. Mom not using nipple shield and nipple is erect/compressible.  Reviewed importance of positioning and deep latch to milk transfer. Encouraged Mom to continue to post pump for 15 minutes and give baby back any amount of colostrum received. Per chart review baby has been to breast 11 times in past 24 hours 5-20 minutes, average 10-15 minutes, 2 voids/0 stools in past 24 hours, 2 voids/3stools in life. Mom has supplemented 3 ml of colostrum 1 time, 5 ml of colostrum 2 times - spoon feeding. Was on d/c list today but waiting on another stool before decision to d/c is made per parents. Basic teaching reviewed with parents. Advised baby should be at breast 8-12 times in 24 hours and with feeding ques. Reviewed expected output. Baby now 3 hours old. 3% weight loss. Encouraged Mom to call with next feeding for LC to assess latch/milk transfer at breast.   Maternal Data    Feeding Feeding Type: Breast Fed Length of feed: 8 min  LATCH Score/Interventions Latch: Grasps breast easily, tongue down, lips flanged, rhythmical sucking. Intervention(s): Adjust position;Breast massage;Assist with latch;Breast compression  Audible Swallowing: A few with stimulation Intervention(s): Skin to skin;Hand expression  Type of Nipple: Everted at rest and after stimulation  Comfort (Breast/Nipple): Soft / non-tender  Problem noted: Mild/Moderate discomfort Interventions  (Cracked/bleeding/bruising/blister): Expressed breast milk to nipple Interventions (Mild/moderate  discomfort): Hand expression  Hold (Positioning): Assistance needed to correctly position infant at breast and maintain latch. Intervention(s): Breastfeeding basics reviewed;Support Pillows;Position options;Skin to skin  LATCH Score: 8  Lactation Tools Discussed/Used Tools: Pump Breast pump type: Double-Electric Breast Pump   Consult Status Consult Status: Follow-up Date: 11/01/16 Follow-up type: In-patient    Katrine Coho 11/01/2016, 11:04 AM

## 2016-11-01 NOTE — Discharge Summary (Signed)
Obstetric Discharge Summary Reason for Admission: induction of labor Prenatal Procedures: ultrasound Intrapartum Procedures: vacuum Postpartum Procedures: none Complications-Operative and Postpartum: 2 degree perineal laceration Hemoglobin  Date Value Ref Range Status  10/31/2016 10.8 (L) 12.0 - 15.0 g/dL Final   HCT  Date Value Ref Range Status  10/31/2016 29.7 (L) 36.0 - 46.0 % Final    Physical Exam:  General: alert and cooperative Lochia: appropriate Uterine Fundus: firm Incision: healing well DVT Evaluation: No evidence of DVT seen on physical exam. Negative Homan's sign. No cords or calf tenderness. No significant calf/ankle edema.  Discharge Diagnoses: Term Pregnancy-delivered  Discharge Information: Date: 11/01/2016 Activity: pelvic rest Diet: routine Medications: PNV and Ibuprofen Condition: stable Instructions: refer to practice specific booklet Discharge to: home   Newborn Data: Live born female  Birth Weight: 7 lb 12.5 oz (3530 g) APGAR: 6, 8  Home with mother.  Horice Carrero G 11/01/2016, 8:22 AM

## 2016-11-01 NOTE — Progress Notes (Signed)
RN alerted CSW to hx of Anxiety and Depression.  CSW has not received consult during MOB's admission.  CSW reviewed chart and notes hx is in [employer].  CSW will not complete assessment unless MOB requests.

## 2016-11-01 NOTE — Lactation Note (Signed)
This note was copied from a baby's chart. Lactation Consultation Note  Patient Name: Girl Bannie Khan [id number] Date: 11/01/2016 Reason for consult: Follow-up assessment  Baby 67 hours old. Mom called for assessment with latch. Baby at left breast, latched deeply with lips flanged and suckling rhythmically--with some swallows noted. Enc mom to place baby's hands in such a way that the baby is hugging the breast so that the baby can transfer milk more easily. Discussed progression of milk coming to volume and enc mom to offer lots of STS and attempts at the breast.   Maternal Data    Feeding Feeding Type: Breast Fed Length of feed: 13 min  LATCH Score/Interventions Latch: Grasps breast easily, tongue down, lips flanged, rhythmical sucking. Intervention(s): Adjust position;Breast massage;Assist with latch;Breast compression  Audible Swallowing: A few with stimulation Intervention(s): Skin to skin;Hand expression  Type of Nipple: Everted at rest and after stimulation  Comfort (Breast/Nipple): Soft / non-tender  Problem noted: Mild/Moderate discomfort Interventions  (Cracked/bleeding/bruising/blister): Expressed breast milk to nipple Interventions (Mild/moderate discomfort): Hand expression  Hold (Positioning): Assistance needed to correctly position infant at breast and maintain latch. Intervention(s): Position options  LATCH Score: 8  Lactation Tools Discussed/Used Tools: Pump Breast pump type: Double-Electric Breast Pump   Consult Status Consult Status: Follow-up Date: 11/02/16 Follow-up type: In-patient    Andres Labrum 11/01/2016, 2:04 PM

## 2016-11-04 ENCOUNTER — Ambulatory Visit (HOSPITAL_COMMUNITY): Payer: BLUE CROSS/BLUE SHIELD

## 2016-11-04 ENCOUNTER — Ambulatory Visit (HOSPITAL_COMMUNITY)
Admission: RE | Admit: 2016-11-04 | Discharge: 2016-11-04 | Disposition: A | Discharge status: Home / Self Care | Payer: BLUE CROSS/BLUE SHIELD | Source: Ambulatory Visit | Attending: Obstetrics and Gynecology | Admitting: Obstetrics and Gynecology

## 2016-11-04 DIAGNOSIS — Z029 Encounter for administrative examinations, unspecified: Secondary | ICD-10-CM | POA: Diagnosis present

## 2016-11-04 NOTE — Lactation Note (Signed)
Lactation Consult - 89 day old  baby Tina Wilson and mom Tina Wilson and dad present for 3:30 pm appt..  Per mom milk came in yesterday without engorgement. Baby cluster fed all night and I was exhausted. Baby last fed at 2:30 pm left breast 7 mins , right 13 mins.  Sore nipples at 1st and have improved.  1st baby . Baby has been cluster feeding about every 1 hours at night and every 2-3 hours days and evenings. For 20 mins at a time. 9 wets , 6 mustard seedy stools.  Per mom has a [employer] used it occasionally until milk came for some 10 mins post pumping with #24 flange and it seemed to be a good fit.   Mother's reason for visit: per mom recently had a baby and want to make sure I'm doing this 100%  Visit Type: feeding assessment  Appointment Notes: Pt. Confirmed , discharged on Friday 2/16 - baby is wanting to latch every 45 mins to an hour. Does not seem content and has not slept much Consult:  Initial Lactation Consultant:  Tina Wilson  ________________________________________________________________________ Tina Wilson Name:  Tina Wilson Date of Birth:  10/30/2015 Pediatrician:  Dr. Elnita Wilson  Gender:  female Gestational Age: [redacted]w[redacted]d (At Birth) Birth Weight:  7 lb 12.5 oz (3530 g) Weight at Discharge:  Weight: 7 lb 8.6 oz (3420 g)               Date of Discharge:  11/01/2016     [facility] Weights   10/30/16 2217 11/01/16 0000  Weight: 7 lb 12.5 oz (3530 g) 7 lb 8.6 oz (3420 g)  Last weight taken from location outside of [facility]: 2/19 - 7-6 oz      Location:Pediatrician's office Weight today: 7-7.1 oz , 3376 g  ___________________________________________________________________  Mother's Name: Tina Wilson Type of delivery:  Vaginal  Breastfeeding Experience: 1st baby  Maternal Medical Conditions:  No risk for milk supply . Mom does have hx of asthma, anxiety / depression  Maternal Medications:  PNV ,    ________________________________________________________________________  Breastfeeding History (Post - see above note  Discharge)____________________________________________________________________ Maternal Breast Assessment  Breast:  Full Nipple:  Erect ( appear healthy ) compressible areolas bilaterally  Pain level:  0 Pain interventions:  Expressed breast milk  _______________________________________________________________________ Feeding Assessment/Evaluation  Initial feeding assessment:  Infant's oral assessment: LC assessed baby's mouth with gloved fingers , noted a high palate,  Upper labial frenulum short just above the gum line, upper lip stretches well with exam and when latched both breast. Baby able to lift tongue upward, and extend a short distance over gum line  Positioning: 1st latch left  breast /  Football / football  - Latch score 8 , latched with assistance to obtain depth and to show mom what depth at the latch looked like. Nutritive feeding pattern noted and per mom comfortable.  LC had to flip upper lip to flanged position. Baby fed 12 mins , and became non - nutritive so LC had mom release suction and nipple well rounded. Tools:  None for latch  IPre-feed weight: 3376 g , 7-7.1 oz  Post-feed weight: 3396 g , 7-7.8 oz  Amount transferred: 20 ml  Amount supplemented:  None   Additional Feeding Assessment -   Positioning:  2nd latch Cross cradle  / right  breast / - Latch score 8 , latched with assistance for depth. Nutritive  sucking pattern for only 10 mins , and became non - nutritive and LC had mom release suction. Nipple well rounded when baby was released and per mom the latch was comfortable.  Pre-feed weight: 3396 g , 7-7.8 oz  Post-feed weight:  3406 g , 7-8.2 oz  Amount transferred:  10 ml  Amount supplemented: none   Additional feeding :   Positioning: 3rd latch / left /crosscradle / Latch score 9 , latched better with depth, nutritive sucking  pattern/ and fed 10 mins / and released. Nipple well rounded and per mom comfortable latch.  Pre - feed weight: 3406 g , 7-8.2 oz  Post- feed weight: 3412 g , 7-8.4 oz  Amount transferred: 6 ml  Amount supplemented: none   Positioning:  4 th latch right breast/ football/ Latch score 10/ mom independent with latch and depth at the breast . Per mom comfortable and baby fed 12 plus minutes and became non - nutritive , mom released suction, nipple well rounded.  Pre  - feed weight: 3412g , 7-8.4 oz  Post - feed weight: 3426 g , 7-8.8 oz  Amount transferred: 14 ml   Lactation Impression:  After baby was to the breast 4 x;s was finally satisfied. Mom arrived very tired and LC suspects due to be exhausted is not letting down well. ( [place] encouraged naps / and rest , plenty fluids and calories ) see LC plan for details . LC had to review basics several times.  Infant's oral assessment: LC assessed baby's mouth with gloved fingers , noted a high palate,  Upper labial frenulum short just above the gum line, upper lip stretches well with exam and when latched both breast. Baby able to lift tongue upward, and extend a short distance over gum line  Total amount pumped post feed:  Did not post pump   Total amount transferred:  50 ml ( for 5 day old adequate amount )  Total supplement given:  None  Lactation Plan of care :  Praised mom for her efforts breast feeding and dad for being supportive  Feedings - days/ evenings 2-2 1/2 hours - offer breast STS  Nights by 3 hours and with feeding cues  Growth Spurts - 7-10 days , 3 weeks , 6 weeks , cluster feedings normal  Important if breast are full ( areola ) hand express , or pre pump hand pump of DEBP 2-3 mins . So areola is compressible for the baby's mouth.  Engorgement prevention and tx reviewed.

## 2018-06-22 NOTE — Progress Notes (Signed)
Subjective:    Patient ID: Tina Wilson, female    DOB: [birth year], 32 y.o.   MRN: [id number]  HPI:  [name] is here to establish as a new pt.  She is a pleasant 32 year old female. PMH: Sig hx of Migraine without aura since highschool. She reports extensive neurological work-up  20 years ago to include MRI imaging and "food elimination program".  She was successfully treated Fioricet throughout her teens and twenties.  She reports complete resolution of migraine's during pregnancy and has not resumed rx. She now estimates 3 "regular HA days/week" and 1 migraine every 1-2 weeks. She denies aura with migraine but will experience will photosensitivity, sound-sensitivity, and sig pain for hours. She estimates to drink >100 oz water a day and tries to follow a heart healthy diet, however her husband is a chef/baker so she will indulge in what he makes from time to time. She denies tobacco/vape use She walks daily and hopes to increase more "vigorous exercise". She has an 45 month old daughter "Tina Wilson" She reports intermittent insomnia, often r/t to nocturia due to her excellent hydration  Patient Care Team    Relationship Specialty Notifications Start End  Mina Marble D, NP PCP - General Family Medicine  06/23/18   [name], MD  Internal Medicine  01/11/13    Comment: Iona Hansen)   Everlene Farrier, MD Consulting Physician Obstetrics and Gynecology  06/23/18     Patient Active Problem List   Diagnosis Date Noted  . Migraine without aura and without status migrainosus, not intractable 06/23/2018  . Post-dates pregnancy 10/30/2016  . Healthcare maintenance 01/11/2013     Past Medical History:  Diagnosis Date  . Allergy   . Anxiety   . Asthma   . Depression   . Herpes genitalis in women 2010  . Migraine      Past Surgical History:  Procedure Laterality Date  . NO PAST SURGERIES       Family History  Problem Relation Age of Onset  . Alcohol  abuse Maternal Grandmother   . Breast cancer Paternal Grandmother   . Alcohol abuse Paternal Grandfather   . Arthritis Mother   . Depression Mother   . Hyperlipidemia Father   . Hypertension Father   . Alcohol abuse Paternal Aunt   . Heart attack Maternal Grandfather   . Stroke Maternal Grandfather   . Cancer Neg Hx   . COPD Neg Hx   . Diabetes Neg Hx   . Drug abuse Neg Hx   . Early death Neg Hx   . Heart disease Neg Hx   . Kidney disease Neg Hx      Social History   Substance and Sexual Activity  Drug Use No   Comment: valtrex     Social History   Substance and Sexual Activity  Alcohol Use Yes  . Alcohol/week: 14.0 standard drinks  . Types: 4 Standard drinks or equivalent, 10 Glasses of wine per week     Social History   Tobacco Use  Smoking Status Never Smoker  Smokeless Tobacco Never Used     Outpatient Encounter Medications as of 06/23/2018  Medication Sig  . [DISCONTINUED] Prenatal Vit-Fe Fumarate-FA (PRENATAL MULTIVITAMIN) TABS tablet Take 1 tablet by mouth daily at 12 noon.  . [DISCONTINUED] valACYclovir (VALTREX) 1000 MG tablet Take 1,000 mg by mouth 2 (two) times daily.   No facility-administered encounter medications on file as of 06/23/2018.     Allergies:  Patient has no known allergies.  Body mass index is 29.92 kg/m.  Blood pressure 114/77, pulse 74, height 5\' 2"  (1.575 m), weight 163 lb 9.6 oz (74.2 kg), last menstrual period 06/11/2018, SpO2 99 %, unknown if currently breastfeeding.     Review of Systems  Constitutional: Positive for fatigue. Negative for activity change, appetite change, chills, diaphoresis, fever and unexpected weight change.  HENT: Negative for congestion.   Eyes: Negative for visual disturbance.  Respiratory: Negative for cough, choking, chest tightness, shortness of breath, wheezing and stridor.   Cardiovascular: Negative for chest pain, palpitations and leg swelling.  Gastrointestinal: Negative for abdominal  pain, anal bleeding, blood in stool, constipation, diarrhea, nausea, rectal pain and vomiting.  Endocrine: Negative for cold intolerance, heat intolerance, polydipsia, polyphagia and polyuria.  Genitourinary: Negative for difficulty urinating and flank pain.  Musculoskeletal: Negative for arthralgias, back pain, gait problem, joint swelling, myalgias, neck pain and neck stiffness.  Skin: Negative for color change, pallor, rash and wound.  Neurological: Negative for dizziness.  Hematological: Does not bruise/bleed easily.  Psychiatric/Behavioral: Positive for sleep disturbance.       Objective:   Physical Exam  Constitutional: She is oriented to person, place, and time. She appears well-developed and well-nourished. No distress.  HENT:  Head: Normocephalic and atraumatic.  Right Ear: External ear normal.  Left Ear: External ear normal.  Nose: Nose normal.  Mouth/Throat: Oropharynx is clear and moist.  Eyes: Pupils are equal, round, and reactive to light. Conjunctivae and EOM are normal.  Cardiovascular: Normal rate, regular rhythm, normal heart sounds and intact distal pulses.  No murmur heard. Pulmonary/Chest: Effort normal and breath sounds normal. No stridor. No respiratory distress. She has no wheezes. She has no rales. She exhibits no tenderness.  Neurological: She is alert and oriented to person, place, and time.  Skin: Skin is warm and dry. Capillary refill takes less than 2 seconds. No rash noted. She is not diaphoretic. No erythema.  Psychiatric: She has a normal mood and affect. Her behavior is normal. Judgment and thought content normal.  Nursing note and vitals reviewed.     Assessment & Plan:   1. Routine general medical examination at a health care facility   2. Migraine without aura and without status migrainosus, not intractable   3. Healthcare maintenance     Healthcare maintenance  Continue your excellent water intake and follow a Mediterranean Diet. Increase  regular exercise.  Recommend at least 30 minutes daily, 5 days per week of walking, jogging, biking, swimming, YouTube/Pinterest workout videos. Referral to Neurology placed, re: Migraines. Recommend complete physical in 6 weeks, please schedule fasting lab appt the week prior.  Migraine without aura and without status migrainosus, not intractable Estimates 1 migraine once a week/every other week  FOLLOW-UP:  Return in about 6 weeks (around 08/04/2018) for CPE, Fasting Labs.

## 2018-06-23 ENCOUNTER — Encounter: Payer: Self-pay | Admitting: Adult Health

## 2018-06-23 ENCOUNTER — Ambulatory Visit: Payer: 59 | Admitting: Adult Health

## 2018-06-23 VITALS — BP 114/77 | HR 74 | Ht 62.0 in | Wt 163.6 lb

## 2018-06-23 DIAGNOSIS — Z0000 Encounter for general adult medical examination without abnormal findings: Secondary | ICD-10-CM

## 2018-06-23 DIAGNOSIS — G43009 Migraine without aura, not intractable, without status migrainosus: Secondary | ICD-10-CM

## 2018-06-23 NOTE — Patient Instructions (Signed)
Mediterranean Diet A Mediterranean diet refers to food and lifestyle choices that are based on the traditions of countries located on the [place] Sea. This way of eating has been shown to help prevent certain conditions and improve outcomes for people who have chronic diseases, like kidney disease and heart disease. What are tips for following this plan? Lifestyle  Cook and eat meals together with your family, when possible.  Drink enough fluid to keep your urine clear or pale yellow.  Be physically active every day. This includes: ? Aerobic exercise like running or swimming. ? Leisure activities like gardening, walking, or housework.  Get 7-8 hours of sleep each night.  If recommended by your health care provider, drink red wine in moderation. This means 1 glass a day for nonpregnant women and 2 glasses a day for men. A glass of wine equals 5 oz (150 mL). Reading food labels  Check the serving size of packaged foods. For foods such as rice and pasta, the serving size refers to the amount of cooked product, not dry.  Check the total fat in packaged foods. Avoid foods that have saturated fat or trans fats.  Check the ingredients list for added sugars, such as corn syrup. Shopping  At the grocery store, buy most of your food from the areas near the walls of the store. This includes: ? Fresh fruits and vegetables (produce). ? Grains, beans, nuts, and seeds. Some of these may be available in unpackaged forms or large amounts (in bulk). ? Fresh seafood. ? Poultry and eggs. ? Low-fat dairy products.  Buy whole ingredients instead of prepackaged foods.  Buy fresh fruits and vegetables in-season from local farmers markets.  Buy frozen fruits and vegetables in resealable bags.  If you do not have access to quality fresh seafood, buy precooked frozen shrimp or canned fish, such as tuna, salmon, or sardines.  Buy small amounts of raw or cooked vegetables, salads, or olives from the  deli or salad bar at your store.  Stock your pantry so you always have certain foods on hand, such as olive oil, canned tuna, canned tomatoes, rice, pasta, and beans. Cooking  Cook foods with extra-virgin olive oil instead of using butter or other vegetable oils.  Have meat as a side dish, and have vegetables or grains as your main dish. This means having meat in small portions or adding small amounts of meat to foods like pasta or stew.  Use beans or vegetables instead of meat in common dishes like chili or lasagna.  Experiment with different cooking methods. Try roasting or broiling vegetables instead of steaming or sauteing them.  Add frozen vegetables to soups, stews, pasta, or rice.  Add nuts or seeds for added healthy fat at each meal. You can add these to yogurt, salads, or vegetable dishes.  Marinate fish or vegetables using olive oil, lemon juice, garlic, and fresh herbs. Meal planning  Plan to eat 1 vegetarian meal one day each week. Try to work up to 2 vegetarian meals, if possible.  Eat seafood 2 or more times a week.  Have healthy snacks readily available, such as: ? Vegetable sticks with hummus. ? Greek yogurt. ? Fruit and nut trail mix.  Eat balanced meals throughout the week. This includes: ? Fruit: 2-3 servings a day ? Vegetables: 4-5 servings a day ? Low-fat dairy: 2 servings a day ? Fish, poultry, or lean meat: 1 serving a day ? Beans and legumes: 2 or more servings a week ? Nuts   and seeds: 1-2 servings a day ? Whole grains: 6-8 servings a day ? Extra-virgin olive oil: 3-4 servings a day  Limit red meat and sweets to only a few servings a month What are my food choices?  Mediterranean diet ? Recommended ? Grains: Whole-grain pasta. Brown rice. Bulgar wheat. Polenta. Couscous. Whole-wheat bread. [place]. ? Vegetables: Artichokes. Beets. Broccoli. Cabbage. Carrots. Eggplant. Green beans. Chard. Kale. Spinach. Onions. Leeks. Peas. Squash.  Tomatoes. Peppers. Radishes. ? Fruits: Apples. Apricots. Avocado. Berries. Bananas. Cherries. Dates. Figs. Grapes. Lemons. Melon. Oranges. Peaches. Plums. Pomegranate. ? Meats and other protein foods: Beans. Almonds. Sunflower seeds. Pine nuts. Peanuts. [place]. Salmon. Scallops. Shrimp. [place]. Tilapia. Clams. Oysters. Eggs. ? Dairy: Low-fat milk. Cheese. Greek yogurt. ? Beverages: Water. Red wine. Herbal tea. ? Fats and oils: Extra virgin olive oil. Avocado oil. Grape seed oil. ? Sweets and desserts: [place] yogurt with honey. Baked apples. Poached pears. Trail mix. ? Seasoning and other foods: Basil. Cilantro. Coriander. Cumin. Mint. Parsley. Sage. Rosemary. Tarragon. Garlic. Oregano. Thyme. Pepper. Balsalmic vinegar. Tahini. Hummus. Tomato sauce. Olives. Mushrooms. ? Limit these ? Grains: Prepackaged pasta or rice dishes. Prepackaged cereal with added sugar. ? Vegetables: Deep fried potatoes (french fries). ? Fruits: Fruit canned in syrup. ? Meats and other protein foods: Beef. Pork. Lamb. Poultry with skin. Hot dogs. [place] Salines. ? Dairy: Ice cream. Sour cream. Whole milk. ? Beverages: Juice. Sugar-sweetened soft drinks. Beer. Liquor and spirits. ? Fats and oils: Butter. Canola oil. Vegetable oil. Beef fat (tallow). Lard. ? Sweets and desserts: Cookies. Cakes. Pies. Candy. ? Seasoning and other foods: Mayonnaise. Premade sauces and marinades. ? The items listed may not be a complete list. Talk with your dietitian about what dietary choices are right for you. Summary  The Mediterranean diet includes both food and lifestyle choices.  Eat a variety of fresh fruits and vegetables, beans, nuts, seeds, and whole grains.  Limit the amount of red meat and sweets that you eat.  Talk with your health care provider about whether it is safe for you to drink red wine in moderation. This means 1 glass a day for nonpregnant women and 2 glasses a day for men. A glass of wine equals 5 oz (150 mL). This information  is not intended to replace advice given to you by your health care provider. Make sure you discuss any questions you have with your health care provider. Document Released: 04/25/2016 Document Revised: 05/28/2016 Document Reviewed: 04/25/2016 Elsevier Interactive Patient Education  2018 [place] your excellent water intake and follow a Mediterranean Diet. Increase regular exercise.  Recommend at least 30 minutes daily, 5 days per week of walking, jogging, biking, swimming, YouTube/Pinterest workout videos. Referral to Neurology placed, re: Migraines. Recommend complete physical in 6 weeks, please schedule fasting lab appt the week prior. WELCOME TO THE PRACTICE!

## 2018-06-23 NOTE — Assessment & Plan Note (Signed)
  Continue your excellent water intake and follow a Mediterranean Diet. Increase regular exercise.  Recommend at least 30 minutes daily, 5 days per week of walking, jogging, biking, swimming, YouTube/Pinterest workout videos. Referral to Neurology placed, re: Migraines. Recommend complete physical in 6 weeks, please schedule fasting lab appt the week prior.

## 2018-06-23 NOTE — Assessment & Plan Note (Signed)
Estimates 1 migraine once a week/every other week

## 2018-06-24 ENCOUNTER — Encounter: Payer: Self-pay | Admitting: Neurology

## 2018-07-16 ENCOUNTER — Other Ambulatory Visit: Payer: 59

## 2018-07-16 DIAGNOSIS — Z0000 Encounter for general adult medical examination without abnormal findings: Secondary | ICD-10-CM

## 2018-07-17 LAB — CBC WITH DIFFERENTIAL/PLATELET
BASOS ABS: 0 10*3/uL (ref 0.0–0.2)
Basos: 1 %
EOS (ABSOLUTE): 0.1 10*3/uL (ref 0.0–0.4)
Eos: 1 %
Hematocrit: 36.7 % (ref 34.0–46.6)
Hemoglobin: 12.6 g/dL (ref 11.1–15.9)
IMMATURE GRANULOCYTES: 0 %
Immature Grans (Abs): 0 10*3/uL (ref 0.0–0.1)
Lymphocytes Absolute: 1.4 10*3/uL (ref 0.7–3.1)
Lymphs: 28 %
MCH: 31 pg (ref 26.6–33.0)
MCHC: 34.3 g/dL (ref 31.5–35.7)
MCV: 90 fL (ref 79–97)
MONOS ABS: 0.4 10*3/uL (ref 0.1–0.9)
Monocytes: 7 %
NEUTROS PCT: 63 %
Neutrophils Absolute: 3.2 10*3/uL (ref 1.4–7.0)
PLATELETS: 133 10*3/uL — AB (ref 150–450)
RBC: 4.07 x10E6/uL (ref 3.77–5.28)
RDW: 11.9 % — AB (ref 12.3–15.4)
WBC: 5.1 10*3/uL (ref 3.4–10.8)

## 2018-07-17 LAB — LIPID PANEL
CHOL/HDL RATIO: 4.5 ratio — AB (ref 0.0–4.4)
Cholesterol, Total: 246 mg/dL — ABNORMAL HIGH (ref 100–199)
HDL: 55 mg/dL (ref >39)
LDL CALC: 165 mg/dL — AB (ref 0–99)
Triglycerides: 129 mg/dL (ref 0–149)
VLDL Cholesterol Cal: 26 mg/dL (ref 5–40)

## 2018-07-17 LAB — COMPREHENSIVE METABOLIC PANEL
A/G RATIO: 2 (ref 1.2–2.2)
ALK PHOS: 46 IU/L (ref 39–117)
ALT: 17 IU/L (ref 0–32)
AST: 18 IU/L (ref 0–40)
Albumin: 4.9 g/dL (ref 3.5–5.5)
BUN/Creatinine Ratio: 15 (ref 9–23)
BUN: 10 mg/dL (ref 6–20)
Bilirubin Total: 0.6 mg/dL (ref 0.0–1.2)
CHLORIDE: 101 mmol/L (ref 96–106)
CO2: 23 mmol/L (ref 20–29)
Calcium: 9.2 mg/dL (ref 8.7–10.2)
Creatinine, Ser: 0.67 mg/dL (ref 0.57–1.00)
GFR calc Af Amer: 135 mL/min/{1.73_m2} (ref >59)
GFR calc non Af Amer: 118 mL/min/{1.73_m2} (ref >59)
GLOBULIN, TOTAL: 2.4 g/dL (ref 1.5–4.5)
Glucose: 98 mg/dL (ref 65–99)
POTASSIUM: 4.2 mmol/L (ref 3.5–5.2)
SODIUM: 138 mmol/L (ref 134–144)
Total Protein: 7.3 g/dL (ref 6.0–8.5)

## 2018-07-17 LAB — TSH: TSH: 1.45 u[IU]/mL (ref 0.450–4.500)

## 2018-07-17 LAB — HEMOGLOBIN A1C
ESTIMATED AVERAGE GLUCOSE: 103 mg/dL
HEMOGLOBIN A1C: 5.2 % (ref 4.8–5.6)

## 2018-07-20 ENCOUNTER — Ambulatory Visit (INDEPENDENT_AMBULATORY_CARE_PROVIDER_SITE_OTHER): Payer: 59 | Admitting: Adult Health

## 2018-07-20 ENCOUNTER — Encounter: Payer: Self-pay | Admitting: Adult Health

## 2018-07-20 VITALS — BP 108/71 | HR 77 | Temp 98.4°F | Resp 16 | Wt 165.0 lb

## 2018-07-20 DIAGNOSIS — R05 Cough: Secondary | ICD-10-CM | POA: Diagnosis not present

## 2018-07-20 DIAGNOSIS — J029 Acute pharyngitis, unspecified: Secondary | ICD-10-CM | POA: Insufficient documentation

## 2018-07-20 DIAGNOSIS — R059 Cough, unspecified: Secondary | ICD-10-CM | POA: Insufficient documentation

## 2018-07-20 LAB — POCT RAPID STREP A (OFFICE): RAPID STREP A SCREEN: NEGATIVE

## 2018-07-20 MED ORDER — HYDROCOD POLST-CPM POLST ER 10-8 MG/5ML PO SUER: 5.0000 mL | Freq: Two times a day (BID) | ORAL | 0 refills | Status: DC | PRN

## 2018-07-20 MED ORDER — PREDNISONE 20 MG PO TABS: ORAL_TABLET | ORAL | 0 refills | Status: DC

## 2018-07-20 NOTE — Patient Instructions (Signed)

## 2018-07-20 NOTE — Assessment & Plan Note (Signed)
[place] Controlled Substance Database reviewed- no aberrancies noted Tussionex as needed

## 2018-07-20 NOTE — Progress Notes (Signed)
Subjective:    Patient ID: Tina Wilson, female    DOB: 11/14/1985, 32 y.o.   MRN: [id number]  HPI:  Tina Wilson presents with sore throat (9/10, described as constant pressure and ache), clear nasal drainage, and non-productive cough that started 3 days ago. She denies fever/night sweats/chills/N/V/D She reports her 99 month old daughter has been acutely ill with high fever the last week. Tina Wilson has ben pushing fluids and taking NyQuil, last dose was QHS, current temp 98.4 f She continues to abstain from tobacco/vape use She is not currently breastfeeding   Patient Care Team    Relationship Specialty Notifications Start End  Mina Marble D, NP PCP - General Family Medicine  06/23/18   [name], MD  Internal Medicine  01/11/13    Comment: Iona Hansen)   Everlene Farrier, MD Consulting Physician Obstetrics and Gynecology  06/23/18     Patient Active Problem List   Diagnosis Date Noted  . Sore throat 07/20/2018  . Cough 07/20/2018  . Migraine without aura and without status migrainosus, not intractable 06/23/2018  . Post-dates pregnancy 10/30/2016  . Healthcare maintenance 01/11/2013     Past Medical History:  Diagnosis Date  . Allergy   . Anxiety   . Asthma   . Depression   . Herpes genitalis in women 2010  . Migraine      Past Surgical History:  Procedure Laterality Date  . NO PAST SURGERIES       Family History  Problem Relation Age of Onset  . Alcohol abuse Maternal Grandmother   . Breast cancer Paternal Grandmother   . Alcohol abuse Paternal Grandfather   . Arthritis Mother   . Depression Mother   . Hyperlipidemia Father   . Hypertension Father   . Alcohol abuse Paternal Aunt   . Heart attack Maternal Grandfather   . Stroke Maternal Grandfather   . Cancer Neg Hx   . COPD Neg Hx   . Diabetes Neg Hx   . Drug abuse Neg Hx   . Early death Neg Hx   . Heart disease Neg Hx   . Kidney disease Neg Hx      Social History   Substance and Sexual  Activity  Drug Use No   Comment: valtrex     Social History   Substance and Sexual Activity  Alcohol Use Yes  . Alcohol/week: 14.0 standard drinks  . Types: 4 Standard drinks or equivalent, 10 Glasses of wine per week     Social History   Tobacco Use  Smoking Status Never Smoker  Smokeless Tobacco Never Used     Outpatient Encounter Medications as of 07/20/2018  Medication Sig  . chlorpheniramine-HYDROcodone (TUSSIONEX PENNKINETIC ER) 10-8 MG/5ML SUER Take 5 mLs by mouth every 12 (twelve) hours as needed.  . predniSONE (DELTASONE) 20 MG tablet 1 tab twice daily for 3 days, then daily for 3 days   No facility-administered encounter medications on file as of 07/20/2018.     Allergies: Patient has no known allergies.  Body mass index is 30.18 kg/m.  Blood pressure 108/71, pulse 77, temperature 98.4 F (36.9 C), temperature source Oral, resp. rate 16, weight 165 lb (74.8 kg), SpO2 99 %, unknown if currently breastfeeding. NOT BREASTFEEDING  Review of Systems  Constitutional: Positive for activity change and fatigue. Negative for appetite change, chills, diaphoresis, fever and unexpected weight change.  HENT: Positive for congestion, postnasal drip, sore throat, trouble swallowing and voice change. Negative for ear discharge.  Eyes: Negative for visual disturbance.  Respiratory: Positive for cough. Negative for chest tightness, shortness of breath, wheezing and stridor.   Cardiovascular: Negative for chest pain, palpitations and leg swelling.  Gastrointestinal: Negative for abdominal distention, anal bleeding, blood in stool, constipation, diarrhea, nausea, rectal pain and vomiting.  Genitourinary: Negative for difficulty urinating, dysuria and flank pain.  Neurological: Negative for dizziness and headaches.  Psychiatric/Behavioral: Positive for sleep disturbance.       Objective:   Physical Exam  Constitutional: She is oriented to person, place, and time. She appears  well-developed and well-nourished.  Non-toxic appearance. She does not appear ill. No distress.  HENT:  Head: Normocephalic and atraumatic.  Right Ear: Hearing, tympanic membrane and ear canal normal. No drainage, swelling or tenderness.  Left Ear: Hearing, tympanic membrane and ear canal normal. No drainage, swelling or tenderness.  No middle ear effusion.  Mouth/Throat: Uvula is midline and mucous membranes are normal. No oral lesions. No uvula swelling. Posterior oropharyngeal edema and posterior oropharyngeal erythema present. No oropharyngeal exudate or tonsillar abscesses. Tonsils are 2+ on the right. Tonsils are 2+ on the left. No tonsillar exudate.  Eyes: Pupils are equal, round, and reactive to light. EOM are normal.  Neck: Normal range of motion. Neck supple. No thyromegaly present.  Cardiovascular: Normal rate, regular rhythm, normal heart sounds and intact distal pulses. Exam reveals no friction rub.  No murmur heard. Pulmonary/Chest: Effort normal and breath sounds normal. No stridor. No respiratory distress. She has no wheezes. She has no rhonchi. She has no rales. She exhibits no tenderness.  Lymphadenopathy:    She has no cervical adenopathy.  Neurological: She is alert and oriented to person, place, and time.  Skin: Skin is warm and dry. Capillary refill takes less than 2 seconds. No rash noted. No erythema. No pallor.  Psychiatric: She has a normal mood and affect. Her behavior is normal.  Nursing note and vitals reviewed.     Assessment & Plan:   1. Sore throat   2. Cough     Sore throat Strep Test - Neg Please take Prednisone taper as directed and Tussionex as needed. Increase fluids/rest/vit c-2,010md/day. Alternate OTC Acetaminophen and Ibuprofen. If not feeling better in a week, call clinic.  Cough [place] Controlled Substance Database reviewed- no aberrancies noted Tussionex as needed     FOLLOW-UP:  Return if symptoms worsen or fail to  improve.

## 2018-07-20 NOTE — Assessment & Plan Note (Signed)
Strep Test - Neg Please take Prednisone taper as directed and Tussionex as needed. Increase fluids/rest/vit c-2,028md/day. Alternate OTC Acetaminophen and Ibuprofen. If not feeling better in a week, call clinic.

## 2018-08-03 NOTE — Progress Notes (Signed)
Subjective:    Patient ID: Tina Wilson, female    DOB: 1986-02-13, 32 y.o.   MRN: [id number]  HPI: 10/81/19 OV:  [name] is here to establish as a new pt.  She is a pleasant 32 year old female. PMH: Sig hx of Migraine without aura since highschool. She reports extensive neurological work-up  20 years ago to include MRI imaging and "food elimination program".  She was successfully treated Fioricet throughout her teens and twenties.  She reports complete resolution of migraine's during pregnancy and has not resumed rx. She now estimates 3 "regular HA days/week" and 1 migraine every 1-2 weeks. She denies aura with migraine but will experience will photosensitivity, sound-sensitivity, and sig pain for hours. She estimates to drink >100 oz water a day and tries to follow a heart healthy diet, however her husband is a chef/baker so she will indulge in what he makes from time to time. She denies tobacco/vape use She walks daily and hopes to increase more "vigorous exercise". She has an 61 month old daughter "Tina Wilson" She reports intermittent insomnia, often r/t to nocturia due to her excellent hydration  08/04/18 OV: [name] is here for CPE She reports drinking plenty of water but still eating a diet high in saturated fat "chicken nuggets and cheese, what my daughter loves". She has not resumed regular exercise since giving birth but plans on re-starting "TRX training" She stopped breastfeeding her daughter 6 months ago She states "we would like one more child"  Reviewed recent labs- A1c- 5.2 Tot chol- 246 HDL 55 LDL 165 Father has hx of HLD CBC- slight thrombocytopenia   Healthcare Maintenance: PAP-UTD Immunizations-UTD  Patient Care Team    Relationship Specialty Notifications Start End  Mina Marble D, NP PCP - General Family Medicine  06/23/18   [name], MD  Internal Medicine  01/11/13    Comment: Iona Hansen)   Everlene Farrier, MD  Consulting Physician Obstetrics and Gynecology  06/23/18     Patient Active Problem List   Diagnosis Date Noted  . Thrombocytopenia ([place]) 08/04/2018  . Elevated LDL cholesterol level 08/04/2018  . Sore throat 07/20/2018  . Cough 07/20/2018  . Migraine without aura and without status migrainosus, not intractable 06/23/2018  . Post-dates pregnancy 10/30/2016  . Healthcare maintenance 01/11/2013     Past Medical History:  Diagnosis Date  . Allergy   . Anxiety   . Asthma   . Depression   . Herpes genitalis in women 2010  . Migraine      Past Surgical History:  Procedure Laterality Date  . NO PAST SURGERIES       Family History  Problem Relation Age of Onset  . Alcohol abuse Maternal Grandmother   . Breast cancer Paternal Grandmother   . Alcohol abuse Paternal Grandfather   . Arthritis Mother   . Depression Mother   . Hyperlipidemia Father   . Hypertension Father   . Alcohol abuse Paternal Aunt   . Heart attack Maternal Grandfather   . Stroke Maternal Grandfather   . Cancer Neg Hx   . COPD Neg Hx   . Diabetes Neg Hx   . Drug abuse Neg Hx   . Early death Neg Hx   . Heart disease Neg Hx   . Kidney disease Neg Hx      Social History   Substance and Sexual Activity  Drug Use No   Comment: valtrex     Social History  Substance and Sexual Activity  Alcohol Use Yes  . Alcohol/week: 14.0 standard drinks  . Types: 4 Standard drinks or equivalent, 10 Glasses of wine per week     Social History   Tobacco Use  Smoking Status Never Smoker  Smokeless Tobacco Never Used     Outpatient Encounter Medications as of 08/04/2018  Medication Sig  . [DISCONTINUED] chlorpheniramine-HYDROcodone (TUSSIONEX PENNKINETIC ER) 10-8 MG/5ML SUER Take 5 mLs by mouth every 12 (twelve) hours as needed.  . [DISCONTINUED] predniSONE (DELTASONE) 20 MG tablet 1 tab twice daily for 3 days, then daily for 3 days   No facility-administered encounter medications on file as of  08/04/2018.     Allergies: Patient has no known allergies.  Body mass index is 28.46 kg/m.  Blood pressure 104/70, pulse 73, temperature 98.7 F (37.1 C), temperature source Oral, height 5' 2.75" (1.594 m), weight 159 lb 6.4 oz (72.3 kg), last menstrual period 07/09/2018, SpO2 99 %, not currently breastfeeding.   Review of Systems  Constitutional: Positive for fatigue. Negative for activity change, appetite change, chills, diaphoresis, fever and unexpected weight change.  HENT: Negative for congestion.   Eyes: Negative for visual disturbance.  Respiratory: Negative for cough, choking, chest tightness, shortness of breath, wheezing and stridor.   Cardiovascular: Negative for chest pain, palpitations and leg swelling.  Gastrointestinal: Negative for abdominal pain, anal bleeding, blood in stool, constipation, diarrhea, nausea, rectal pain and vomiting.  Endocrine: Negative for cold intolerance, heat intolerance, polydipsia, polyphagia and polyuria.  Genitourinary: Negative for difficulty urinating and flank pain.  Musculoskeletal: Negative for arthralgias, back pain, gait problem, joint swelling, myalgias, neck pain and neck stiffness.  Skin: Negative for color change, pallor, rash and wound.  Neurological: Negative for dizziness.  Hematological: Does not bruise/bleed easily.  Psychiatric/Behavioral: Positive for sleep disturbance.       Objective:   Physical Exam  Constitutional: She is oriented to person, place, and time. She appears well-developed and well-nourished. No distress.  HENT:  Head: Normocephalic and atraumatic.  Right Ear: External ear normal. Tympanic membrane is not erythematous and not bulging. No decreased hearing is noted.  Left Ear: External ear normal. Tympanic membrane is not erythematous and not bulging. No decreased hearing is noted.  Nose: Nose normal. No mucosal edema or rhinorrhea. Right sinus exhibits no maxillary sinus tenderness and no frontal sinus  tenderness. Left sinus exhibits no maxillary sinus tenderness and no frontal sinus tenderness.  Mouth/Throat: Uvula is midline, oropharynx is clear and moist and mucous membranes are normal. Tonsils are 0 on the right. Tonsils are 0 on the left. No tonsillar exudate.  Eyes: Pupils are equal, round, and reactive to light. Conjunctivae and EOM are normal.  Neck: Normal range of motion. Neck supple.  Cardiovascular: Normal rate, regular rhythm, normal heart sounds and intact distal pulses.  No murmur heard. Pulmonary/Chest: Effort normal and breath sounds normal. No stridor. No respiratory distress. She has no decreased breath sounds. She has no wheezes. She has no rhonchi. She has no rales. She exhibits no tenderness. Right breast exhibits no inverted nipple, no mass, no nipple discharge, no skin change and no tenderness. Left breast exhibits no inverted nipple, no mass, no nipple discharge, no skin change and no tenderness.  Large, fibrous breasts   Abdominal: Soft. Bowel sounds are normal. She exhibits no distension and no mass. There is no tenderness. There is no rebound and no guarding. No hernia.  Musculoskeletal: Normal range of motion. She exhibits no edema or tenderness.  Lymphadenopathy:    She has no cervical adenopathy.  Neurological: She is alert and oriented to person, place, and time. Coordination normal.  Skin: Skin is warm and dry. Capillary refill takes less than 2 seconds. No rash noted. She is not diaphoretic. No erythema.  Psychiatric: She has a normal mood and affect. Her behavior is normal. Judgment and thought content normal.  Nursing note and vitals reviewed.     Assessment & Plan:   1. Thrombocytopenia ([facility])   2. Elevated LDL cholesterol level   3. Healthcare maintenance     Healthcare maintenance Continue to drink plenty of water and follow Mediterranean diet. Increase regular exercise.  Recommend at least 30 minutes daily, 5 days per week of walking, jogging,  biking, swimming, YouTube/Pinterest workout videos. You can normalize bad cholesterol (LDL) with increased exercise and improved eating habits- YES YOU CAN! Please schedule fasting lab appt in 6 months. Please schedule complete physical with fasting labs in one year.  Thrombocytopenia ([place]) 07/16/18 Plt 133 Will recheck CBC in 6 months 10/1516 CBC Plt 125   Elevated LDL cholesterol level 07/16/18 Tot chol 246 Tgs 129 HDL 55 LDL 165 Father has HLD She does not use tobacco use A1c 5.2 Does not have HTN Encouraged TLC to normalize cholesterol, will re-check lipids in 6 months  FOLLOW-UP:  Return in about 1 year (around 08/05/2019) for CPE.

## 2018-08-04 ENCOUNTER — Encounter: Payer: Self-pay | Admitting: Adult Health

## 2018-08-04 ENCOUNTER — Ambulatory Visit (INDEPENDENT_AMBULATORY_CARE_PROVIDER_SITE_OTHER): Payer: 59 | Admitting: Adult Health

## 2018-08-04 VITALS — BP 104/70 | HR 73 | Temp 98.7°F | Ht 62.75 in | Wt 159.4 lb

## 2018-08-04 DIAGNOSIS — D696 Thrombocytopenia, unspecified: Secondary | ICD-10-CM | POA: Diagnosis not present

## 2018-08-04 DIAGNOSIS — Z0000 Encounter for general adult medical examination without abnormal findings: Secondary | ICD-10-CM | POA: Diagnosis not present

## 2018-08-04 DIAGNOSIS — E7800 Pure hypercholesterolemia, unspecified: Secondary | ICD-10-CM | POA: Insufficient documentation

## 2018-08-04 NOTE — Assessment & Plan Note (Signed)
Continue to drink plenty of water and follow Mediterranean diet. Increase regular exercise.  Recommend at least 30 minutes daily, 5 days per week of walking, jogging, biking, swimming, YouTube/Pinterest workout videos. You can normalize bad cholesterol (LDL) with increased exercise and improved eating habits- YES YOU CAN! Please schedule fasting lab appt in 6 months. Please schedule complete physical with fasting labs in one year.

## 2018-08-04 NOTE — Assessment & Plan Note (Signed)
07/16/18 Tot chol 246 Tgs 129 HDL 55 LDL 165 Father has HLD She does not use tobacco use A1c 5.2 Does not have HTN Encouraged TLC to normalize cholesterol, will re-check lipids in 6 months

## 2018-08-04 NOTE — Patient Instructions (Addendum)
Preventive Care for Adults, Female  A healthy lifestyle and preventive care can promote health and wellness. Preventive health guidelines for women include the following key practices.   A routine yearly physical is a good way to check with your health care provider about your health and preventive screening. It is a chance to share any concerns and updates on your health and to receive a thorough exam.   Visit your dentist for a routine exam and preventive care every 6 months. Brush your teeth twice a day and floss once a day. Good oral hygiene prevents tooth decay and gum disease.   The frequency of eye exams is based on your age, health, family medical history, use of contact lenses, and other factors. Follow your health care provider's recommendations for frequency of eye exams.   Eat a healthy diet. Foods like vegetables, fruits, whole grains, low-fat dairy products, and lean protein foods contain the nutrients you need without too many calories. Decrease your intake of foods high in solid fats, added sugars, and salt. Eat the right amount of calories for you.Get information about a proper diet from your health care provider, if necessary.   Regular physical exercise is one of the most important things you can do for your health. Most adults should get at least 150 minutes of moderate-intensity exercise (any activity that increases your heart rate and causes you to sweat) each week. In addition, most adults need muscle-strengthening exercises on 2 or more days a week.   Maintain a healthy weight. The body mass index (BMI) is a screening tool to identify possible weight problems. It provides an estimate of body fat based on height and weight. Your health care provider can find your BMI, and can help you achieve or maintain a healthy weight.For adults 20 years and older:   - A BMI below 18.5 is considered underweight.   - A BMI of 18.5 to 24.9 is normal.   - A BMI of 25 to 29.9 is  considered overweight.   - A BMI of 30 and above is considered obese.   Maintain normal blood lipids and cholesterol levels by exercising and minimizing your intake of trans and saturated fats.  Eat a balanced diet with plenty of fruit and vegetables. Blood tests for lipids and cholesterol should begin at age 20 and be repeated every 5 years minimum.  If your lipid or cholesterol levels are high, you are over 40, or you are at high risk for heart disease, you may need your cholesterol levels checked more frequently.Ongoing high lipid and cholesterol levels should be treated with medicines if diet and exercise are not working.   If you smoke, find out from your health care provider how to quit. If you do not use tobacco, do not start.   Lung cancer screening is recommended for adults aged 55-80 years who are at high risk for developing lung cancer because of a history of smoking. A yearly low-dose CT scan of the lungs is recommended for people who have at least a 30-pack-year history of smoking and are a current smoker or have quit within the past 15 years. A pack year of smoking is smoking an average of 1 pack of cigarettes a day for 1 year (for example: 1 pack a day for 30 years or 2 packs a day for 15 years). Yearly screening should continue until the smoker has stopped smoking for at least 15 years. Yearly screening should be stopped for people who develop a   health problem that would prevent them from having lung cancer treatment.   If you are pregnant, do not drink alcohol. If you are breastfeeding, be very cautious about drinking alcohol. If you are not pregnant and choose to drink alcohol, do not have more than 1 drink per day. One drink is considered to be 12 ounces (355 mL) of beer, 5 ounces (148 mL) of wine, or 1.5 ounces (44 mL) of liquor.   Avoid use of street drugs. Do not share needles with anyone. Ask for help if you need support or instructions about stopping the use of  drugs.   High blood pressure causes heart disease and increases the risk of stroke. Your blood pressure should be checked at least yearly.  Ongoing high blood pressure should be treated with medicines if weight loss and exercise do not work.   If you are 69-55 years old, ask your health care provider if you should take aspirin to prevent strokes.   Diabetes screening involves taking a blood sample to check your fasting blood sugar level. This should be done once every 3 years, after age 38, if you are within normal weight and without risk factors for diabetes. Testing should be considered at a younger age or be carried out more frequently if you are overweight and have at least 1 risk factor for diabetes.   Breast cancer screening is essential preventive care for women. You should practice "breast self-awareness."  This means understanding the normal appearance and feel of your breasts and may include breast self-examination.  Any changes detected, no matter how small, should be reported to a health care provider.  Women in their 80s and 30s should have a clinical breast exam (CBE) by a health care provider as part of a regular health exam every 1 to 3 years.  After age 66, women should have a CBE every year.  Starting at age 1, women should consider having a mammogram (breast X-ray test) every year.  Women who have a family history of breast cancer should talk to their health care provider about genetic screening.  Women at a high risk of breast cancer should talk to their health care providers about having an MRI and a mammogram every year.   -Breast cancer gene (BRCA)-related cancer risk assessment is recommended for women who have family members with BRCA-related cancers. BRCA-related cancers include breast, ovarian, tubal, and peritoneal cancers. Having family members with these cancers may be associated with an increased risk for harmful changes (mutations) in the breast cancer genes BRCA1 and  BRCA2. Results of the assessment will determine the need for genetic counseling and BRCA1 and BRCA2 testing.   The Pap test is a screening test for cervical cancer. A Pap test can show cell changes on the cervix that might become cervical cancer if left untreated. A Pap test is a procedure in which cells are obtained and examined from the lower end of the uterus (cervix).   - Women should have a Pap test starting at age 57.   - Between ages 90 and 70, Pap tests should be repeated every 2 years.   - Beginning at age 63, you should have a Pap test every 3 years as long as the past 3 Pap tests have been normal.   - Some women have medical problems that increase the chance of getting cervical cancer. Talk to your health care provider about these problems. It is especially important to talk to your health care provider if a  new problem develops soon after your last Pap test. In these cases, your health care provider may recommend more frequent screening and Pap tests.   - The above recommendations are the same for women who have or have not gotten the vaccine for human papillomavirus (HPV).   - If you had a hysterectomy for a problem that was not cancer or a condition that could lead to cancer, then you no longer need Pap tests. Even if you no longer need a Pap test, a regular exam is a good idea to make sure no other problems are starting.   - If you are between ages 36 and 66 years, and you have had normal Pap tests going back 10 years, you no longer need Pap tests. Even if you no longer need a Pap test, a regular exam is a good idea to make sure no other problems are starting.   - If you have had past treatment for cervical cancer or a condition that could lead to cancer, you need Pap tests and screening for cancer for at least 20 years after your treatment.   - If Pap tests have been discontinued, risk factors (such as a new sexual partner) need to be reassessed to determine if screening should  be resumed.   - The HPV test is an additional test that may be used for cervical cancer screening. The HPV test looks for the virus that can cause the cell changes on the cervix. The cells collected during the Pap test can be tested for HPV. The HPV test could be used to screen women aged 70 years and older, and should be used in women of any age who have unclear Pap test results. After the age of 67, women should have HPV testing at the same frequency as a Pap test.   Colorectal cancer can be detected and often prevented. Most routine colorectal cancer screening begins at the age of 57 years and continues through age 26 years. However, your health care provider may recommend screening at an earlier age if you have risk factors for colon cancer. On a yearly basis, your health care provider may provide home test kits to check for hidden blood in the stool.  Use of a small camera at the end of a tube, to directly examine the colon (sigmoidoscopy or colonoscopy), can detect the earliest forms of colorectal cancer. Talk to your health care provider about this at age 23, when routine screening begins. Direct exam of the colon should be repeated every 5 -10 years through age 49 years, unless early forms of pre-cancerous polyps or small growths are found.   People who are at an increased risk for hepatitis B should be screened for this virus. You are considered at high risk for hepatitis B if:  -You were born in a country where hepatitis B occurs often. Talk with your health care provider about which countries are considered high risk.  - Your parents were born in a high-risk country and you have not received a shot to protect against hepatitis B (hepatitis B vaccine).  - You have HIV or AIDS.  - You use needles to inject street drugs.  - You live with, or have sex with, someone who has Hepatitis B.  - You get hemodialysis treatment.  - You take certain medicines for conditions like cancer, organ  transplantation, and autoimmune conditions.   Hepatitis C blood testing is recommended for all people born from [birth year] through 1965 and any individual  with known risks for hepatitis C.   Practice safe sex. Use condoms and avoid high-risk sexual practices to reduce the spread of sexually transmitted infections (STIs). STIs include gonorrhea, chlamydia, syphilis, trichomonas, herpes, HPV, and human immunodeficiency virus (HIV). Herpes, HIV, and HPV are viral illnesses that have no cure. They can result in disability, cancer, and death. Sexually active women aged 25 years and younger should be checked for chlamydia. Older women with new or multiple partners should also be tested for chlamydia. Testing for other STIs is recommended if you are sexually active and at increased risk.   Osteoporosis is a disease in which the bones lose minerals and strength with aging. This can result in serious bone fractures or breaks. The risk of osteoporosis can be identified using a bone density scan. Women ages 65 years and over and women at risk for fractures or osteoporosis should discuss screening with their health care providers. Ask your health care provider whether you should take a calcium supplement or vitamin D to There are also several preventive steps women can take to avoid osteoporosis and resulting fractures or to keep osteoporosis from worsening. -->Recommendations include:  Eat a balanced diet high in fruits, vegetables, calcium, and vitamins.  Get enough calcium. The recommended total intake of is 1,200 mg daily; for best absorption, if taking supplements, divide doses into 250-500 mg doses throughout the day. Of the two types of calcium, calcium carbonate is best absorbed when taken with food but calcium citrate can be taken on an empty stomach.  Get enough vitamin D. NAMS and the National Osteoporosis Foundation recommend at least 1,000 IU per day for women age 50 and over who are at risk of vitamin D  deficiency. Vitamin D deficiency can be caused by inadequate sun exposure (for example, those who live in northern latitudes).  Avoid alcohol and smoking. Heavy alcohol intake (more than 7 drinks per week) increases the risk of falls and hip fracture and women smokers tend to lose bone more rapidly and have lower bone mass than nonsmokers. Stopping smoking is one of the most important changes women can make to improve their health and decrease risk for disease.  Be physically active every day. Weight-bearing exercise (for example, fast walking, hiking, jogging, and weight training) may strengthen bones or slow the rate of bone loss that comes with aging. Balancing and muscle-strengthening exercises can reduce the risk of falling and fracture.  Consider therapeutic medications. Currently, several types of effective drugs are available. Healthcare providers can recommend the type most appropriate for each woman.  Eliminate environmental factors that may contribute to accidents. Falls cause nearly 90% of all osteoporotic fractures, so reducing this risk is an important bone-health strategy. Measures include ample lighting, removing obstructions to walking, using nonskid rugs on floors, and placing mats and/or grab bars in showers.  Be aware of medication side effects. Some common medicines make bones weaker. These include a type of steroid drug called glucocorticoids used for arthritis and asthma, some antiseizure drugs, certain sleeping pills, treatments for endometriosis, and some cancer drugs. An overactive thyroid gland or using too much thyroid hormone for an underactive thyroid can also be a problem. If you are taking these medicines, talk to your doctor about what you can do to help protect your bones.reduce the rate of osteoporosis.    Menopause can be associated with physical symptoms and risks. Hormone replacement therapy is available to decrease symptoms and risks. You should talk to your  health care provider   about whether hormone replacement therapy is right for you.   Use sunscreen. Apply sunscreen liberally and repeatedly throughout the day. You should seek shade when your shadow is shorter than you. Protect yourself by wearing long sleeves, pants, a wide-brimmed hat, and sunglasses year round, whenever you are outdoors.   Once a month, do a whole body skin exam, using a mirror to look at the skin on your back. Tell your health care provider of new moles, moles that have irregular borders, moles that are larger than a pencil eraser, or moles that have changed in shape or color.   -Stay current with required vaccines (immunizations).   Influenza vaccine. All adults should be immunized every year.  Tetanus, diphtheria, and acellular pertussis (Td, Tdap) vaccine. Pregnant women should receive 1 dose of Tdap vaccine during each pregnancy. The dose should be obtained regardless of the length of time since the last dose. Immunization is preferred during the 27th 36th week of gestation. An adult who has not previously received Tdap or who does not know her vaccine status should receive 1 dose of Tdap. This initial dose should be followed by tetanus and diphtheria toxoids (Td) booster doses every 10 years. Adults with an unknown or incomplete history of completing a 3-dose immunization series with Td-containing vaccines should begin or complete a primary immunization series including a Tdap dose. Adults should receive a Td booster every 10 years.  Varicella vaccine. An adult without evidence of immunity to varicella should receive 2 doses or a second dose if she has previously received 1 dose. Pregnant females who do not have evidence of immunity should receive the first dose after pregnancy. This first dose should be obtained before leaving the health care facility. The second dose should be obtained 4 8 weeks after the first dose.  Human papillomavirus (HPV) vaccine. Females aged 13 26  years who have not received the vaccine previously should obtain the 3-dose series. The vaccine is not recommended for use in pregnant females. However, pregnancy testing is not needed before receiving a dose. If a female is found to be pregnant after receiving a dose, no treatment is needed. In that case, the remaining doses should be delayed until after the pregnancy. Immunization is recommended for any person with an immunocompromised condition through the age of 26 years if she did not get any or all doses earlier. During the 3-dose series, the second dose should be obtained 4 8 weeks after the first dose. The third dose should be obtained 24 weeks after the first dose and 16 weeks after the second dose.  Zoster vaccine. One dose is recommended for adults aged 60 years or older unless certain conditions are present.  Measles, mumps, and rubella (MMR) vaccine. Adults born before 1957 generally are considered immune to measles and mumps. Adults born in 1957 or later should have 1 or more doses of MMR vaccine unless there is a contraindication to the vaccine or there is laboratory evidence of immunity to each of the three diseases. A routine second dose of MMR vaccine should be obtained at least 28 days after the first dose for students attending postsecondary schools, health care workers, or international travelers. People who received inactivated measles vaccine or an unknown type of measles vaccine during 1963 1967 should receive 2 doses of MMR vaccine. People who received inactivated mumps vaccine or an unknown type of mumps vaccine before 1979 and are at high risk for mumps infection should consider immunization with 2 doses of   MMR vaccine. For females of childbearing age, rubella immunity should be determined. If there is no evidence of immunity, females who are not pregnant should be vaccinated. If there is no evidence of immunity, females who are pregnant should delay immunization until after pregnancy.  Unvaccinated health care workers born before [birth year] who lack laboratory evidence of measles, mumps, or rubella immunity or laboratory confirmation of disease should consider measles and mumps immunization with 2 doses of MMR vaccine or rubella immunization with 1 dose of MMR vaccine.  Pneumococcal 13-valent conjugate (PCV13) vaccine. When indicated, a person who is uncertain of her immunization history and has no record of immunization should receive the PCV13 vaccine. An adult aged 54 years or older who has certain medical conditions and has not been previously immunized should receive 1 dose of PCV13 vaccine. This PCV13 should be followed with a dose of pneumococcal polysaccharide (PPSV23) vaccine. The PPSV23 vaccine dose should be obtained at least 8 weeks after the dose of PCV13 vaccine. An adult aged 58 years or older who has certain medical conditions and previously received 1 or more doses of PPSV23 vaccine should receive 1 dose of PCV13. The PCV13 vaccine dose should be obtained 1 or more years after the last PPSV23 vaccine dose.  Pneumococcal polysaccharide (PPSV23) vaccine. When PCV13 is also indicated, PCV13 should be obtained first. All adults aged 58 years and older should be immunized. An adult younger than age 65 years who has certain medical conditions should be immunized. Any person who resides in a nursing home or long-term care facility should be immunized. An adult smoker should be immunized. People with an immunocompromised condition and certain other conditions should receive both PCV13 and PPSV23 vaccines. People with human immunodeficiency virus (HIV) infection should be immunized as soon as possible after diagnosis. Immunization during chemotherapy or radiation therapy should be avoided. Routine use of PPSV23 vaccine is not recommended for American Indians, [place] Natives, or people younger than 65 years unless there are medical conditions that require PPSV23 vaccine. When indicated,  people who have unknown immunization and have no record of immunization should receive PPSV23 vaccine. One-time revaccination 5 years after the first dose of PPSV23 is recommended for people aged 70 64 years who have chronic kidney failure, nephrotic syndrome, asplenia, or immunocompromised conditions. People who received 1 2 doses of PPSV23 before age 32 years should receive another dose of PPSV23 vaccine at age 96 years or later if at least 5 years have passed since the previous dose. Doses of PPSV23 are not needed for people immunized with PPSV23 at or after age 55 years.  Meningococcal vaccine. Adults with asplenia or persistent complement component deficiencies should receive 2 doses of quadrivalent meningococcal conjugate (MenACWY-D) vaccine. The doses should be obtained at least 2 months apart. Microbiologists working with certain meningococcal bacteria, [employer] recruits, people at risk during an outbreak, and people who travel to or live in countries with a high rate of meningitis should be immunized. A first-year college student up through age 58 years who is living in a residence hall should receive a dose if she did not receive a dose on or after her 16th birthday. Adults who have certain high-risk conditions should receive one or more doses of vaccine.  Hepatitis A vaccine. Adults who wish to be protected from this disease, have certain high-risk conditions, work with hepatitis A-infected animals, work in hepatitis A research labs, or travel to or work in countries with a high rate of hepatitis A should be  immunized. Adults who were previously unvaccinated and who anticipate close contact with an international adoptee during the first 60 days after arrival in the [place] from a country with a high rate of hepatitis A should be immunized.  Hepatitis B vaccine.  Adults who wish to be protected from this disease, have certain high-risk conditions, may be exposed to blood or other infectious  body fluids, are household contacts or sex partners of hepatitis B positive people, are clients or workers in certain care facilities, or travel to or work in countries with a high rate of hepatitis B should be immunized.  Haemophilus influenzae type b (Hib) vaccine. A previously unvaccinated person with asplenia or sickle cell disease or having a scheduled splenectomy should receive 1 dose of Hib vaccine. Regardless of previous immunization, a recipient of a hematopoietic stem cell transplant should receive a 3-dose series 6 12 months after her successful transplant. Hib vaccine is not recommended for adults with HIV infection.  Preventive Services / Frequency Ages 6 to 39years  Blood pressure check.** / Every 1 to 2 years.  Lipid and cholesterol check.** / Every 5 years beginning at age 39.  Clinical breast exam.** / Every 3 years for women in their 61s and 62s.  BRCA-related cancer risk assessment.** / For women who have family members with a BRCA-related cancer (breast, ovarian, tubal, or peritoneal cancers).  Pap test.** / Every 2 years from ages 47 through 85. Every 3 years starting at age 34 through age 12 or 74 with a history of 3 consecutive normal Pap tests.  HPV screening.** / Every 3 years from ages 46 through ages 43 to 54 with a history of 3 consecutive normal Pap tests.  Hepatitis C blood test.** / For any individual with known risks for hepatitis C.  Skin self-exam. / Monthly.  Influenza vaccine. / Every year.  Tetanus, diphtheria, and acellular pertussis (Tdap, Td) vaccine.** / Consult your health care provider. Pregnant women should receive 1 dose of Tdap vaccine during each pregnancy. 1 dose of Td every 10 years.  Varicella vaccine.** / Consult your health care provider. Pregnant females who do not have evidence of immunity should receive the first dose after pregnancy.  HPV vaccine. / 3 doses over 6 months, if 64 and younger. The vaccine is not recommended for use in  pregnant females. However, pregnancy testing is not needed before receiving a dose.  Measles, mumps, rubella (MMR) vaccine.** / You need at least 1 dose of MMR if you were born in 1957 or later. You may also need a 2nd dose. For females of childbearing age, rubella immunity should be determined. If there is no evidence of immunity, females who are not pregnant should be vaccinated. If there is no evidence of immunity, females who are pregnant should delay immunization until after pregnancy.  Pneumococcal 13-valent conjugate (PCV13) vaccine.** / Consult your health care provider.  Pneumococcal polysaccharide (PPSV23) vaccine.** / 1 to 2 doses if you smoke cigarettes or if you have certain conditions.  Meningococcal vaccine.** / 1 dose if you are age 71 to 37 years and a Market researcher living in a residence hall, or have one of several medical conditions, you need to get vaccinated against meningococcal disease. You may also need additional booster doses.  Hepatitis A vaccine.** / Consult your health care provider.  Hepatitis B vaccine.** / Consult your health care provider.  Haemophilus influenzae type b (Hib) vaccine.** / Consult your health care provider.  Ages 55 to 64years  Blood pressure check.** / Every 1 to 2 years.  Lipid and cholesterol check.** / Every 5 years beginning at age 20 years.  Lung cancer screening. / Every year if you are aged 55 80 years and have a 30-pack-year history of smoking and currently smoke or have quit within the past 15 years. Yearly screening is stopped once you have quit smoking for at least 15 years or develop a health problem that would prevent you from having lung cancer treatment.  Clinical breast exam.** / Every year after age 40 years.  BRCA-related cancer risk assessment.** / For women who have family members with a BRCA-related cancer (breast, ovarian, tubal, or peritoneal cancers).  Mammogram.** / Every year beginning at age 40  years and continuing for as long as you are in good health. Consult with your health care provider.  Pap test.** / Every 3 years starting at age 30 years through age 65 or 70 years with a history of 3 consecutive normal Pap tests.  HPV screening.** / Every 3 years from ages 30 years through ages 65 to 70 years with a history of 3 consecutive normal Pap tests.  Fecal occult blood test (FOBT) of stool. / Every year beginning at age 50 years and continuing until age 75 years. You may not need to do this test if you get a colonoscopy every 10 years.  Flexible sigmoidoscopy or colonoscopy.** / Every 5 years for a flexible sigmoidoscopy or every 10 years for a colonoscopy beginning at age 50 years and continuing until age 75 years.  Hepatitis C blood test.** / For all people born from 1945 through 1965 and any individual with known risks for hepatitis C.  Skin self-exam. / Monthly.  Influenza vaccine. / Every year.  Tetanus, diphtheria, and acellular pertussis (Tdap/Td) vaccine.** / Consult your health care provider. Pregnant women should receive 1 dose of Tdap vaccine during each pregnancy. 1 dose of Td every 10 years.  Varicella vaccine.** / Consult your health care provider. Pregnant females who do not have evidence of immunity should receive the first dose after pregnancy.  Zoster vaccine.** / 1 dose for adults aged 60 years or older.  Measles, mumps, rubella (MMR) vaccine.** / You need at least 1 dose of MMR if you were born in 1957 or later. You may also need a 2nd dose. For females of childbearing age, rubella immunity should be determined. If there is no evidence of immunity, females who are not pregnant should be vaccinated. If there is no evidence of immunity, females who are pregnant should delay immunization until after pregnancy.  Pneumococcal 13-valent conjugate (PCV13) vaccine.** / Consult your health care provider.  Pneumococcal polysaccharide (PPSV23) vaccine.** / 1 to 2 doses if  you smoke cigarettes or if you have certain conditions.  Meningococcal vaccine.** / Consult your health care provider.  Hepatitis A vaccine.** / Consult your health care provider.  Hepatitis B vaccine.** / Consult your health care provider.  Haemophilus influenzae type b (Hib) vaccine.** / Consult your health care provider.  Ages 65 years and over  Blood pressure check.** / Every 1 to 2 years.  Lipid and cholesterol check.** / Every 5 years beginning at age 20 years.  Lung cancer screening. / Every year if you are aged 55 80 years and have a 30-pack-year history of smoking and currently smoke or have quit within the past 15 years. Yearly screening is stopped once you have quit smoking for at least 15 years or develop a health problem that   would prevent you from having lung cancer treatment.  Clinical breast exam.** / Every year after age 103 years.  BRCA-related cancer risk assessment.** / For women who have family members with a BRCA-related cancer (breast, ovarian, tubal, or peritoneal cancers).  Mammogram.** / Every year beginning at age 36 years and continuing for as long as you are in good health. Consult with your health care provider.  Pap test.** / Every 3 years starting at age 5 years through age 85 or 10 years with 3 consecutive normal Pap tests. Testing can be stopped between 65 and 70 years with 3 consecutive normal Pap tests and no abnormal Pap or HPV tests in the past 10 years.  HPV screening.** / Every 3 years from ages 93 years through ages 70 or 45 years with a history of 3 consecutive normal Pap tests. Testing can be stopped between 65 and 70 years with 3 consecutive normal Pap tests and no abnormal Pap or HPV tests in the past 10 years.  Fecal occult blood test (FOBT) of stool. / Every year beginning at age 8 years and continuing until age 45 years. You may not need to do this test if you get a colonoscopy every 10 years.  Flexible sigmoidoscopy or colonoscopy.** /  Every 5 years for a flexible sigmoidoscopy or every 10 years for a colonoscopy beginning at age 69 years and continuing until age 68 years.  Hepatitis C blood test.** / For all people born from [birth year] through 1964 and any individual with known risks for hepatitis C.  Osteoporosis screening.** / A one-time screening for women ages 7 years and over and women at risk for fractures or osteoporosis.  Skin self-exam. / Monthly.  Influenza vaccine. / Every year.  Tetanus, diphtheria, and acellular pertussis (Tdap/Td) vaccine.** / 1 dose of Td every 10 years.  Varicella vaccine.** / Consult your health care provider.  Zoster vaccine.** / 1 dose for adults aged 5 years or older.  Pneumococcal 13-valent conjugate (PCV13) vaccine.** / Consult your health care provider.  Pneumococcal polysaccharide (PPSV23) vaccine.** / 1 dose for all adults aged 74 years and older.  Meningococcal vaccine.** / Consult your health care provider.  Hepatitis A vaccine.** / Consult your health care provider.  Hepatitis B vaccine.** / Consult your health care provider.  Haemophilus influenzae type b (Hib) vaccine.** / Consult your health care provider. ** Family history and personal history of risk and conditions may change your health care provider's recommendations. Document Released: 10/29/2001 Document Revised: 06/23/2013  [facility] Patient Information 2014 [place], [place].   EXERCISE AND DIET:  We recommended that you start or continue a regular exercise program for good health. Regular exercise means any activity that makes your heart beat faster and makes you sweat.  We recommend exercising at least 30 minutes per day at least 3 days a week, preferably 5.  We also recommend a diet low in fat and sugar / carbohydrates.  Inactivity, poor dietary choices and obesity can cause diabetes, heart attack, stroke, and kidney damage, among others.     ALCOHOL AND SMOKING:  Women should limit their alcohol intake to no  more than 7 drinks/beers/glasses of wine (combined, not each!) per week. Moderation of alcohol intake to this level decreases your risk of breast cancer and liver damage.  ( And of course, no recreational drugs are part of a healthy lifestyle.)  Also, you should not be smoking at all or even being exposed to second hand smoke. Most people know smoking can  cause cancer, and various heart and lung diseases, but did you know it also contributes to weakening of your bones?  Aging of your skin?  Yellowing of your teeth and nails?   CALCIUM AND VITAMIN D:  Adequate intake of calcium and Vitamin D are recommended.  The recommendations for exact amounts of these supplements seem to change often, but generally speaking 600 mg of calcium (either carbonate or citrate) and 800 units of Vitamin D per day seems prudent. Certain women may benefit from higher intake of Vitamin D.  If you are among these women, your doctor will have told you during your visit.     PAP SMEARS:  Pap smears, to check for cervical cancer or precancers,  have traditionally been done yearly, although recent scientific advances have shown that most women can have pap smears less often.  However, every woman still should have a physical exam from her gynecologist or primary care physician every year. It will include a breast check, inspection of the vulva and vagina to check for abnormal growths or skin changes, a visual exam of the cervix, and then an exam to evaluate the size and shape of the uterus and ovaries.  And after 31 years of age, a rectal exam is indicated to check for rectal cancers. We will also provide age appropriate advice regarding health maintenance, like when you should have certain vaccines, screening for sexually transmitted diseases, bone density testing, colonoscopy, mammograms, etc.    MAMMOGRAMS:  All women over 65 years old should have a yearly mammogram. Many facilities now offer a "3D" mammogram, which may cost  around $50 extra out of pocket. If possible,  we recommend you accept the option to have the 3D mammogram performed.  It both reduces the number of women who will be called back for extra views which then turn out to be normal, and it is better than the routine mammogram at detecting truly abnormal areas.     COLONOSCOPY:  Colonoscopy to screen for colon cancer is recommended for all women at age 26.  We know, you hate the idea of the prep.  We agree, BUT, having colon cancer and not knowing it is worse!!  Colon cancer so often starts as a polyp that can be seen and removed at colonscopy, which can quite literally save your life!  And if your first colonoscopy is normal and you have no family history of colon cancer, most women don't have to have it again for 10 years.  Once every ten years, you can do something that may end up saving your life, right?  We will be happy to help you get it scheduled when you are ready.  Be sure to check your insurance coverage so you understand how much it will cost.  It may be covered as a preventative service at no cost, but you should check your particular policy.    Mediterranean Diet A Mediterranean diet refers to food and lifestyle choices that are based on the traditions of countries located on the [employer]. This way of eating has been shown to help prevent certain conditions and improve outcomes for people who have chronic diseases, like kidney disease and heart disease. What are tips for following this plan? Lifestyle  Cook and eat meals together with your family, when possible.  Drink enough fluid to keep your urine clear or pale yellow.  Be physically active every day. This includes: ? Aerobic exercise like running or swimming. ? Leisure activities like  gardening, walking, or housework.  Get 7-8 hours of sleep each night.  If recommended by your health care provider, drink red wine in moderation. This means 1 glass a day for nonpregnant women  and 2 glasses a day for men. A glass of wine equals 5 oz (150 mL). Reading food labels  Check the serving size of packaged foods. For foods such as rice and pasta, the serving size refers to the amount of cooked product, not dry.  Check the total fat in packaged foods. Avoid foods that have saturated fat or trans fats.  Check the ingredients list for added sugars, such as corn syrup. Shopping  At the grocery store, buy most of your food from the areas near the walls of the store. This includes: ? Fresh fruits and vegetables (produce). ? Grains, beans, nuts, and seeds. Some of these may be available in unpackaged forms or large amounts (in bulk). ? Fresh seafood. ? Poultry and eggs. ? Low-fat dairy products.  Buy whole ingredients instead of prepackaged foods.  Buy fresh fruits and vegetables in-season from local farmers markets.  Buy frozen fruits and vegetables in resealable bags.  If you do not have access to quality fresh seafood, buy precooked frozen shrimp or canned fish, such as tuna, salmon, or sardines.  Buy small amounts of raw or cooked vegetables, salads, or olives from the deli or salad bar at your store.  Stock your pantry so you always have certain foods on hand, such as olive oil, canned tuna, canned tomatoes, rice, pasta, and beans. Cooking  Cook foods with extra-virgin olive oil instead of using butter or other vegetable oils.  Have meat as a side dish, and have vegetables or grains as your main dish. This means having meat in small portions or adding small amounts of meat to foods like pasta or stew.  Use beans or vegetables instead of meat in common dishes like chili or lasagna.  Experiment with different cooking methods. Try roasting or broiling vegetables instead of steaming or sauteing them.  Add frozen vegetables to soups, stews, pasta, or rice.  Add nuts or seeds for added healthy fat at each meal. You can add these to yogurt, salads, or vegetable  dishes.  Marinate fish or vegetables using olive oil, lemon juice, garlic, and fresh herbs. Meal planning  Plan to eat 1 vegetarian meal one day each week. Try to work up to 2 vegetarian meals, if possible.  Eat seafood 2 or more times a week.  Have healthy snacks readily available, such as: ? Vegetable sticks with hummus. ? [place] yogurt. ? Fruit and nut trail mix.  Eat balanced meals throughout the week. This includes: ? Fruit: 2-3 servings a day ? Vegetables: 4-5 servings a day ? Low-fat dairy: 2 servings a day ? Fish, poultry, or lean meat: 1 serving a day ? Beans and legumes: 2 or more servings a week ? Nuts and seeds: 1-2 servings a day ? Whole grains: 6-8 servings a day ? Extra-virgin olive oil: 3-4 servings a day  Limit red meat and sweets to only a few servings a month What are my food choices?  Mediterranean diet ? Recommended ? Grains: Whole-grain pasta. Brown rice. Bulgar wheat. Polenta. Couscous. Whole-wheat bread. [place]. ? Vegetables: Artichokes. Beets. Broccoli. Cabbage. Carrots. Eggplant. Green beans. Chard. Kale. Spinach. Onions. Leeks. Peas. Squash. Tomatoes. Peppers. Radishes. ? Fruits: Apples. Apricots. Avocado. Berries. Bananas. Cherries. Dates. Figs. Grapes. Lemons. Melon. Oranges. Peaches. Plums. Pomegranate. ? Meats and other protein  foods: Beans. Almonds. Sunflower seeds. Pine nuts. Peanuts. [place]. Salmon. Scallops. Shrimp. [place]. Tilapia. Clams. Oysters. Eggs. ? Dairy: Low-fat milk. Cheese. Greek yogurt. ? Beverages: Water. Red wine. Herbal tea. ? Fats and oils: Extra virgin olive oil. Avocado oil. Grape seed oil. ? Sweets and desserts: [place] yogurt with honey. Baked apples. Poached pears. Trail mix. ? Seasoning and other foods: Basil. Cilantro. Coriander. Cumin. Mint. Parsley. Sage. Rosemary. Tarragon. Garlic. Oregano. Thyme. Pepper. Balsalmic vinegar. Tahini. Hummus. Tomato sauce. Olives. Mushrooms. ? Limit these ? Grains: Prepackaged pasta or  rice dishes. Prepackaged cereal with added sugar. ? Vegetables: Deep fried potatoes (french fries). ? Fruits: Fruit canned in syrup. ? Meats and other protein foods: Beef. Pork. Lamb. Poultry with skin. Hot dogs. [place] Salines. ? Dairy: Ice cream. Sour cream. Whole milk. ? Beverages: Juice. Sugar-sweetened soft drinks. Beer. Liquor and spirits. ? Fats and oils: Butter. Canola oil. Vegetable oil. Beef fat (tallow). Lard. ? Sweets and desserts: Cookies. Cakes. Pies. Candy. ? Seasoning and other foods: Mayonnaise. Premade sauces and marinades. ? The items listed may not be a complete list. Talk with your dietitian about what dietary choices are right for you. Summary  The Mediterranean diet includes both food and lifestyle choices.  Eat a variety of fresh fruits and vegetables, beans, nuts, seeds, and whole grains.  Limit the amount of red meat and sweets that you eat.  Talk with your health care provider about whether it is safe for you to drink red wine in moderation. This means 1 glass a day for nonpregnant women and 2 glasses a day for men. A glass of wine equals 5 oz (150 mL). This information is not intended to replace advice given to you by your health care provider. Make sure you discuss any questions you have with your health care provider. Document Released: 04/25/2016 Document Revised: 05/28/2016 Document Reviewed: 04/25/2016 Elsevier Interactive Patient Education  2018 [place] to drink plenty of water and follow Mediterranean diet. Increase regular exercise.  Recommend at least 30 minutes daily, 5 days per week of walking, jogging, biking, swimming, YouTube/Pinterest workout videos. You can normalize bad cholesterol (LDL) with increased exercise and improved eating habits- YES YOU CAN! Please schedule fasting lab appt in 6 months. Please schedule complete physical with fasting labs in one year. NICE TO SEE YOU!

## 2018-08-04 NOTE — Assessment & Plan Note (Signed)
07/16/18 Plt 133 Will recheck CBC in 6 months 10/1516 CBC Plt 125

## 2018-08-18 NOTE — Progress Notes (Signed)
NEUROLOGY CONSULTATION NOTE  Tina Wilson MRN: [id number] DOB: 18-Jul-1986  Referring provider: Mina Marble, NP Primary care provider: Mina Marble, NP  Reason for consult:  migraines  HISTORY OF PRESENT ILLNESS: Tina Wilson is a 32 year old right-handed female who presents for migraines.  History supplemented by referring provider's note.  Onset:  Since teenager. Location:  1) above the left eye, 2) occipital Quality:  1) stabbing/pounding, 2) pressure Intensity:  1) 8/10, 2) 6-7/10.  She denies new headache, thunderclap headache or severe headache that wakes her from sleep. Aura:  no Prodrome:  no Postdrome:  no Associated symptoms:  If severe:  Nausea, vomiting, photophobia, phonophobia, osmophobia.  She denies associated visual disturbance or unilateral numbness or weakness. Duration:  1) 3 hours to all day, 2) a week Frequency:  1) once a month, 2) Once a week Frequency of abortive medication: Takes an OTC medication daily Triggers:  Perfume/cologne Relieving factors:  During pregnancy and breast feeding Activity:  Aggravates severe ones In adult life, been to ED for migraines about 5 or 6 times She does have a daily mild headache as well  Current NSAIDS:  ibuprofen Current analgesics:  Extra-strength Tylenol, Excedrin, BC powder (if severe) Current triptans: None Current ergotamine: None Current anti-emetic: None Current muscle relaxants: None Current anti-anxiolytic: None Current sleep aide: None Current Antihypertensive medications: None Current Antidepressant medications: None Current Anticonvulsant medications: None Current anti-CGRP: None Current Vitamins/Herbal/Supplements: None Current Antihistamines/Decongestants: None Other therapy: None Current birth control:  None   Has had prior MRI years ago.  Past NSAIDS:  None Past analgesics:  Fioriciet (nausea), Tylenol Past abortive triptans:  None Past abortive ergotamine:  None Past muscle relaxants: None Past anti-emetic: None Past antihypertensive medications: None Past antidepressant medications: None Past anticonvulsant medications: None Past anti-CGRP: None Past vitamins/Herbal/Supplements: None Past antihistamines/decongestants: None Other past therapies: None  Caffeine:  2 to 4 cups of coffee daily, afternoon cola Diet:  Drinks over 100 oz water daily, tries to eat healthy.  She does eat cheese. Exercise:  Walks daily Depression:  no; Anxiety:  yes Other pain:  Neck and shoulder pain Sleep hygiene:  Varies.  Sometimes difficulty falling asleep due to anxiety, nocturia (drinks a lot of water) Family history of headache:  mom  07/16/18 LABS:  CBC with WBC 5.1, HGB 12.6, HCT 36.7, PLT 133; CMP with Na 138, K 4.2, Cl 101, CO2 23, glucose 98, BUN 10, Cr 0.67, t bili 0.6, ALP 46, AST 18, ALT 17; TSH 1.450  PAST MEDICAL HISTORY: Past Medical History:  Diagnosis Date  . Allergy   . Anxiety   . Asthma   . Depression   . Herpes genitalis in women 2010  . Migraine     PAST SURGICAL HISTORY: Past Surgical History:  Procedure Laterality Date  . NO PAST SURGERIES      MEDICATIONS: No current outpatient medications on file prior to visit.   No current facility-administered medications on file prior to visit.     ALLERGIES: No Known Allergies  FAMILY HISTORY: Family History  Problem Relation Age of Onset  . Alcohol abuse Maternal Grandmother   . Breast cancer Paternal Grandmother   . Alcohol abuse Paternal Grandfather   . Arthritis Mother   . Depression Mother   . Hyperlipidemia Father   . Hypertension Father   . Alcohol abuse Paternal Aunt   . Heart attack Maternal Grandfather   . Stroke Maternal Grandfather   . Cancer Neg Hx   .  COPD Neg Hx   . Diabetes Neg Hx   . Drug abuse Neg Hx   . Early death Neg Hx   . Heart disease Neg Hx   . Kidney disease Neg Hx    SOCIAL HISTORY: Social History   Socioeconomic History  .  Marital status: Married    Spouse name: Not on file  . Number of children: Not on file  . Years of education: Not on file  . Highest education level: Not on file  Occupational History  . Not on file  Social Needs  . Financial resource strain: Not on file  . Food insecurity:    Worry: Not on file    Inability: Not on file  . Transportation needs:    Medical: Not on file    Non-medical: Not on file  Tobacco Use  . Smoking status: Never Smoker  . Smokeless tobacco: Never Used  Substance and Sexual Activity  . Alcohol use: Yes    Alcohol/week: 14.0 standard drinks    Types: 4 Standard drinks or equivalent, 10 Glasses of wine per week  . Drug use: No    Comment: valtrex  . Sexual activity: Yes    Partners: Male    Birth control/protection: None    Comment: married.  Lifestyle  . Physical activity:    Days per week: Not on file    Minutes per session: Not on file  . Stress: Not on file  Relationships  . Social connections:    Talks on phone: Not on file    Gets together: Not on file    Attends religious service: Not on file    Active member of club or organization: Not on file    Attends meetings of clubs or organizations: Not on file    Relationship status: Not on file  . Intimate partner violence:    Fear of current or ex partner: Not on file    Emotionally abused: Not on file    Physically abused: Not on file    Forced sexual activity: Not on file  Other Topics Concern  . Not on file  Social History Narrative   ** Merged History Encounter **       Marital status: married      Children: none      Living with: husband      Employment: works from [employer].      Tobacco: none       Alcohol: 2 glasses wine nightly      Drugs: none      Exercise: sporadically    REVIEW OF SYSTEMS: Constitutional: No fevers, chills, or sweats, no generalized fatigue, change in appetite Eyes: No visual changes, double vision, eye pain Ear, nose and throat: No  hearing loss, ear pain, nasal congestion, sore throat Cardiovascular: No chest pain, palpitations Respiratory:  No shortness of breath at rest or with exertion, wheezes GastrointestinaI: No nausea, vomiting, diarrhea, abdominal pain, fecal incontinence Genitourinary:  No dysuria, urinary retention or frequency Musculoskeletal:  No neck pain, back pain Integumentary: No rash, pruritus, skin lesions Neurological: as above Psychiatric: No depression, insomnia, anxiety Endocrine: No palpitations, fatigue, diaphoresis, mood swings, change in appetite, change in weight, increased thirst Hematologic/Lymphatic:  No purpura, petechiae. Allergic/Immunologic: no itchy/runny eyes, nasal congestion, recent allergic reactions, rashes  PHYSICAL EXAM: Blood pressure 110/72, pulse 81, height 5\' 2"  (1.575 m), weight 161 lb (73 kg), SpO2 98 %, not currently breastfeeding. General: No acute distress.  Patient appears well-groomed.  Head:  Normocephalic/atraumatic Eyes:  fundi examined but not visualized Neck: supple, no paraspinal tenderness, full range of motion Back: No paraspinal tenderness Heart: regular rate and rhythm Lungs: Clear to auscultation bilaterally. Vascular: No carotid bruits. Neurological Exam: Mental status: alert and oriented to person, place, and time, recent and remote memory intact, fund of knowledge intact, attention and concentration intact, speech fluent and not dysarthric, language intact. Cranial nerves: CN I: not tested CN II: pupils equal, round and reactive to light, visual fields intact CN III, IV, VI:  full range of motion, no nystagmus, no ptosis CN V: facial sensation intact CN VII: upper and lower face symmetric CN VIII: hearing intact CN IX, X: gag intact, uvula midline CN XI: sternocleidomastoid and trapezius muscles intact CN XII: tongue midline Bulk & Tone: normal, no fasciculations. Motor:  5/5 throughout  Sensation: temperature and vibration sensation  intact. Deep Tendon Reflexes:  2+ throughout, toes downgoing.  Finger to nose testing:  Without dysmetria.  Heel to shin:  Without dysmetria.  Gait:  Normal station and stride.  Able to turn and tandem walk. Romberg negative.  IMPRESSION: Chronic migraine without aura, without status migrainosus, not intractable.  PLAN: 1.  For preventative management, we will start nortriptyline 25 mg at bedtime.  We can increase the dose to 50 mg at bedtime in 4 weeks if needed.  At this time we would avoid using topiramate as she plans to start having another child in about a year. 2.  For abortive therapy, she will try sumatriptan 100 mg at earliest onset of migraine.  She may repeat dose after 2 hours if needed. 3. She will stop all over-the-counter pain relievers.  Limit use of pain relievers to no more than 2 days out of week to prevent risk of rebound or medication-overuse headache. 4.  Keep headache diary 5. Discussed exercise, hydration, caffeine cessation, sleep hygiene, monitor for and avoid triggers 6.  Consider:  magnesium citrate 400mg  daily, riboflavin 400mg  daily, and coenzyme Q10 100mg  three times daily 7.  Follow up in 3 to 4 months.   Thank you for allowing me to take part in the care of this patient.  Metta Clines, DO  CC: Mina Marble, NP

## 2018-08-20 ENCOUNTER — Encounter: Payer: Self-pay | Admitting: Neurology

## 2018-08-20 ENCOUNTER — Ambulatory Visit: Payer: 59 | Admitting: Neurology

## 2018-08-20 VITALS — BP 110/72 | HR 81 | Ht 62.0 in | Wt 161.0 lb

## 2018-08-20 DIAGNOSIS — G43709 Chronic migraine without aura, not intractable, without status migrainosus: Secondary | ICD-10-CM

## 2018-08-20 MED ORDER — SUMATRIPTAN SUCCINATE 100 MG PO TABS: ORAL_TABLET | ORAL | 3 refills | Status: DC

## 2018-08-20 MED ORDER — NORTRIPTYLINE HCL 25 MG PO CAPS: 25.0000 mg | ORAL_CAPSULE | Freq: Every day | ORAL | 3 refills | Status: DC

## 2018-08-20 NOTE — Patient Instructions (Signed)
Migraine Recommendations: 1.  Start nortriptyline 25mg  at bedtime.  Contact me in 4 weeks with update and we can adjust dose if needed. 2.  Take sumatriptan 100mg   at earliest onset of headache.  May repeat dose once in 2 hours if needed.  Do not exceed two tablets in 24 hours. 3.  STOP ALL OVER THE COUNTER PAIN RELIEVERS.  Limit use of pain relievers to no more than 2 days out of the week.  These medications include acetaminophen, ibuprofen, triptans and narcotics.  This will help reduce risk of rebound headaches. 4.  Be aware of common food triggers such as processed sweets, processed foods with nitrites (such as deli meat, hot dogs, sausages), foods with MSG, alcohol (such as wine), chocolate, certain cheeses, certain fruits (dried fruits, bananas, pineapple), vinegar, diet soda. 4.  Avoid caffeine 5.  Routine exercise 6.  Proper sleep hygiene 7.  Stay adequately hydrated with water 8.  Keep a headache diary. 9.  Maintain proper stress management. 10.  Do not skip meals. 11.  Consider supplements:  Magnesium citrate 400mg  to 600mg  daily, riboflavin 400mg , Coenzyme Q 10 100mg  three times daily   Migraine Headache A migraine headache is a very strong throbbing pain on one side or both sides of your head. Migraines can also cause other symptoms. Talk with your doctor about what things may bring on (trigger) your migraine headaches. Follow these instructions at home: Medicines  Take over-the-counter and prescription medicines only as told by your doctor.  Do not drive or use heavy machinery while taking prescription pain medicine.  To prevent or treat constipation while you are taking prescription pain medicine, your doctor may recommend that you: ? Drink enough fluid to keep your pee (urine) clear or pale yellow. ? Take over-the-counter or prescription medicines. ? Eat foods that are high in fiber. These include fresh fruits and vegetables, whole grains, and beans. ? Limit foods that are  high in fat and processed sugars. These include fried and sweet foods. Lifestyle  Avoid alcohol.  Do not use any products that contain nicotine or tobacco, such as cigarettes and e-cigarettes. If you need help quitting, ask your doctor.  Get at least 8 hours of sleep every night.  Limit your stress. General instructions   Keep a journal to find out what may bring on your migraines. For example, write down: ? What you eat and drink. ? How much sleep you get. ? Any change in what you eat or drink. ? Any change in your medicines.  If you have a migraine: ? Avoid things that make your symptoms worse, such as bright lights. ? It may help to lie down in a dark, quiet room. ? Do not drive or use heavy machinery. ? Ask your doctor what activities are safe for you.  Keep all follow-up visits as told by your doctor. This is important. Contact a doctor if:  You get a migraine that is different or worse than your usual migraines. Get help right away if:  Your migraine gets very bad.  You have a fever.  You have a stiff neck.  You have trouble seeing.  Your muscles feel weak or like you cannot control them.  You start to lose your balance a lot.  You start to have trouble walking.  You pass out (faint). This information is not intended to replace advice given to you by your health care provider. Make sure you discuss any questions you have with your health care provider.  Document Released: 06/11/2008 Document Revised: 03/22/2016 Document Reviewed: 02/19/2016 Elsevier Interactive Patient Education  2018 [employer].

## 2018-09-21 NOTE — Progress Notes (Deleted)
   Subjective:    Patient ID: Tina Wilson, female    DOB: [birth year], 33 y.o.   MRN: [id number]  HPI:  Ms. Cornisha Zetino presents with   Patient Care Team    Relationship Specialty Notifications Start End  Esaw Grandchild, NP PCP - General Family Medicine  06/23/18   [name], MD  Internal Medicine  01/11/13    Comment: Iona Hansen)   Everlene Farrier, MD Consulting Physician Obstetrics and Gynecology  06/23/18     Patient Active Problem List   Diagnosis Date Noted  . Thrombocytopenia ([place]) 08/04/2018  . Elevated LDL cholesterol level 08/04/2018  . Sore throat 07/20/2018  . Cough 07/20/2018  . Migraine without aura and without status migrainosus, not intractable 06/23/2018  . Post-dates pregnancy 10/30/2016  . Healthcare maintenance 01/11/2013     Past Medical History:  Diagnosis Date  . Allergy   . Anxiety   . Asthma   . Depression   . Herpes genitalis in women 2010  . Migraine      Past Surgical History:  Procedure Laterality Date  . NO PAST SURGERIES       Family History  Problem Relation Age of Onset  . Alcohol abuse Maternal Grandmother   . Breast cancer Paternal Grandmother   . Alcohol abuse Paternal Grandfather   . Arthritis Mother   . Depression Mother   . Hyperlipidemia Father   . Hypertension Father   . Alcohol abuse Paternal Aunt   . Heart attack Maternal Grandfather   . Stroke Maternal Grandfather   . Cancer Neg Hx   . COPD Neg Hx   . Diabetes Neg Hx   . Drug abuse Neg Hx   . Early death Neg Hx   . Heart disease Neg Hx   . Kidney disease Neg Hx      Social History   Substance and Sexual Activity  Drug Use No   Comment: valtrex     Social History   Substance and Sexual Activity  Alcohol Use Yes  . Alcohol/week: 14.0 standard drinks  . Types: 4 Standard drinks or equivalent, 10 Glasses of wine per week     Social History   Tobacco Use  Smoking Status Never Smoker  Smokeless Tobacco Never Used     Outpatient  Encounter Medications as of 09/22/2018  Medication Sig  . nortriptyline (PAMELOR) 25 MG capsule Take 1 capsule (25 mg total) by mouth at bedtime.  . SUMAtriptan (IMITREX) 100 MG tablet Take 1 tablet earliest onset of migraine.  May repeat once in 2 hours if headache persists or recurs.  Do not exceed 2 tablets in 24h   No facility-administered encounter medications on file as of 09/22/2018.     Allergies: Patient has no known allergies.  There is no height or weight on file to calculate BMI.  not currently breastfeeding.     Review of Systems     Objective:   Physical Exam        Assessment & Plan:  No diagnosis found.  No problem-specific Assessment & Plan notes found for this encounter.    FOLLOW-UP:  No follow-ups on file.

## 2018-09-22 ENCOUNTER — Ambulatory Visit: Payer: 59 | Admitting: Adult Health

## 2018-11-17 NOTE — Progress Notes (Deleted)
   Subjective:    Patient ID: Tina Wilson, female    DOB: 23-Oct-1985, 33 y.o.   MRN: [id number]  HPI: [name] presents with  Patient Care Team    Relationship Specialty Notifications Start End  Esaw Grandchild, NP PCP - General Family Medicine  06/23/18   [name], MD  Internal Medicine  01/11/13    Comment: Iona Hansen)   Everlene Farrier, MD Consulting Physician Obstetrics and Gynecology  06/23/18     Patient Active Problem List   Diagnosis Date Noted  . Thrombocytopenia ([place]) 08/04/2018  . Elevated LDL cholesterol level 08/04/2018  . Sore throat 07/20/2018  . Cough 07/20/2018  . Migraine without aura and without status migrainosus, not intractable 06/23/2018  . Post-dates pregnancy 10/30/2016  . Healthcare maintenance 01/11/2013     Past Medical History:  Diagnosis Date  . Allergy   . Anxiety   . Asthma   . Depression   . Herpes genitalis in women 2010  . Migraine      Past Surgical History:  Procedure Laterality Date  . NO PAST SURGERIES       Family History  Problem Relation Age of Onset  . Alcohol abuse Maternal Grandmother   . Breast cancer Paternal Grandmother   . Alcohol abuse Paternal Grandfather   . Arthritis Mother   . Depression Mother   . Hyperlipidemia Father   . Hypertension Father   . Alcohol abuse Paternal Aunt   . Heart attack Maternal Grandfather   . Stroke Maternal Grandfather   . Cancer Neg Hx   . COPD Neg Hx   . Diabetes Neg Hx   . Drug abuse Neg Hx   . Early death Neg Hx   . Heart disease Neg Hx   . Kidney disease Neg Hx      Social History   Substance and Sexual Activity  Drug Use No   Comment: valtrex     Social History   Substance and Sexual Activity  Alcohol Use Yes  . Alcohol/week: 14.0 standard drinks  . Types: 4 Standard drinks or equivalent, 10 Glasses of wine per week     Social History   Tobacco Use  Smoking Status Never Smoker  Smokeless Tobacco Never Used     Outpatient Encounter  Medications as of 11/18/2018  Medication Sig  . nortriptyline (PAMELOR) 25 MG capsule Take 1 capsule (25 mg total) by mouth at bedtime.  . SUMAtriptan (IMITREX) 100 MG tablet Take 1 tablet earliest onset of migraine.  May repeat once in 2 hours if headache persists or recurs.  Do not exceed 2 tablets in 24h   No facility-administered encounter medications on file as of 11/18/2018.     Allergies: Patient has no known allergies.  There is no height or weight on file to calculate BMI.  There were no vitals taken for this visit.     Review of Systems     Objective:   Physical Exam        Assessment & Plan:  No diagnosis found.  No problem-specific Assessment & Plan notes found for this encounter.    FOLLOW-UP:  No follow-ups on file.

## 2018-11-18 ENCOUNTER — Ambulatory Visit: Payer: 59 | Admitting: Adult Health

## 2018-12-22 ENCOUNTER — Ambulatory Visit: Payer: 59 | Admitting: Neurology

## 2019-02-02 ENCOUNTER — Other Ambulatory Visit: Payer: 59

## 2019-03-09 ENCOUNTER — Other Ambulatory Visit: Payer: Self-pay | Admitting: Otolaryngology

## 2019-03-09 DIAGNOSIS — R1312 Dysphagia, oropharyngeal phase: Secondary | ICD-10-CM

## 2019-03-09 DIAGNOSIS — R0989 Other specified symptoms and signs involving the circulatory and respiratory systems: Secondary | ICD-10-CM

## 2019-03-11 ENCOUNTER — Other Ambulatory Visit: Payer: 59

## 2019-03-12 ENCOUNTER — Ambulatory Visit
Admission: RE | Admit: 2019-03-12 | Discharge: 2019-03-12 | Disposition: A | Discharge status: Home / Self Care | Payer: 59 | Source: Ambulatory Visit | Attending: Otolaryngology | Admitting: Otolaryngology

## 2019-03-12 DIAGNOSIS — R0989 Other specified symptoms and signs involving the circulatory and respiratory systems: Secondary | ICD-10-CM

## 2019-03-12 DIAGNOSIS — R1312 Dysphagia, oropharyngeal phase: Secondary | ICD-10-CM

## 2019-04-27 LAB — OB RESULTS CONSOLE GC/CHLAMYDIA
Chlamydia: NEGATIVE
Gonorrhea: NEGATIVE

## 2019-04-27 LAB — OB RESULTS CONSOLE HEPATITIS B SURFACE ANTIGEN: Hepatitis B Surface Ag: NEGATIVE

## 2019-04-27 LAB — OB RESULTS CONSOLE ABO/RH: RH Type: POSITIVE

## 2019-04-27 LAB — OB RESULTS CONSOLE ANTIBODY SCREEN: Antibody Screen: NEGATIVE

## 2019-04-27 LAB — OB RESULTS CONSOLE RUBELLA ANTIBODY, IGM: Rubella: IMMUNE

## 2019-04-27 LAB — OB RESULTS CONSOLE HIV ANTIBODY (ROUTINE TESTING): HIV: NONREACTIVE

## 2019-04-27 LAB — OB RESULTS CONSOLE RPR: RPR: NONREACTIVE

## 2019-05-11 ENCOUNTER — Other Ambulatory Visit: Payer: Self-pay

## 2019-05-11 DIAGNOSIS — Z20822 Contact with and (suspected) exposure to covid-19: Secondary | ICD-10-CM

## 2019-05-13 LAB — NOVEL CORONAVIRUS, NAA: SARS-CoV-2, NAA: NOT DETECTED

## 2019-05-14 ENCOUNTER — Ambulatory Visit
Admission: EM | Admit: 2019-05-14 | Discharge: 2019-05-14 | Disposition: A | Discharge status: Home / Self Care | Payer: 59

## 2019-05-14 DIAGNOSIS — R05 Cough: Secondary | ICD-10-CM

## 2019-05-14 DIAGNOSIS — R059 Cough, unspecified: Secondary | ICD-10-CM

## 2019-05-14 NOTE — Discharge Instructions (Signed)
Return for worsening cough, chest pain, fever, difficulty breathing.

## 2019-05-14 NOTE — ED Provider Notes (Signed)
[name]    CSN: [id number] Arrival date & time: 05/14/19  1001      History   Chief Complaint Chief Complaint  Patient presents with  . Cough    HPI Tina Wilson is a 33 y.o. female [redacted] weeks gestation presenting for dry, nonproductive, not hemoptic cough since Sunday evening.  Patient underwent COVID testing on Tuesday, resulted negative.  Patient has had some interruption in her sleep second to cough.  Patient denies fever, malaise, decreased appetite, nausea vomiting, chest pain, shortness of breath.  She has not tried anything yet for this she was unsure what she could take.  Patient does report remote history of atypical pneumonia, last one in when she was in undergraduate.   Past Medical History:  Diagnosis Date  . Allergy   . Anxiety   . Asthma   . Depression   . Herpes genitalis in women 2010  . Migraine     Patient Active Problem List   Diagnosis Date Noted  . Thrombocytopenia ([place]) 08/04/2018  . Elevated LDL cholesterol level 08/04/2018  . Sore throat 07/20/2018  . Cough 07/20/2018  . Migraine without aura and without status migrainosus, not intractable 06/23/2018  . Post-dates pregnancy 10/30/2016  . Healthcare maintenance 01/11/2013    Past Surgical History:  Procedure Laterality Date  . NO PAST SURGERIES      OB History    Gravida  2   Para  1   Term  1   Preterm      AB      Living  1     SAB      TAB      Ectopic      Multiple  0   Live Births  1            Home Medications    Prior to Admission medications   Medication Sig Start Date End Date Taking? Authorizing Provider  nortriptyline (PAMELOR) 25 MG capsule Take 1 capsule (25 mg total) by mouth at bedtime. 08/20/18   Pieter Partridge, DO  SUMAtriptan (IMITREX) 100 MG tablet Take 1 tablet earliest onset of migraine.  May repeat once in 2 hours if headache persists or recurs.  Do not exceed 2 tablets in 24h 08/20/18   Pieter Partridge, DO    Family  History Family History  Problem Relation Age of Onset  . Alcohol abuse Maternal Grandmother   . Breast cancer Paternal Grandmother   . Alcohol abuse Paternal Grandfather   . Arthritis Mother   . Depression Mother   . Hyperlipidemia Father   . Hypertension Father   . Alcohol abuse Paternal Aunt   . Heart attack Maternal Grandfather   . Stroke Maternal Grandfather   . Cancer Neg Hx   . COPD Neg Hx   . Diabetes Neg Hx   . Drug abuse Neg Hx   . Early death Neg Hx   . Heart disease Neg Hx   . Kidney disease Neg Hx     Social History Social History   Tobacco Use  . Smoking status: Never Smoker  . Smokeless tobacco: Never Used  Substance Use Topics  . Alcohol use: Yes    Alcohol/week: 14.0 standard drinks    Types: 4 Standard drinks or equivalent, 10 Glasses of wine per week  . Drug use: No    Comment: valtrex     Allergies   Patient has no known allergies.   Review of Systems  Review of Systems  Constitutional: Negative for activity change, appetite change, fatigue and fever.  HENT: Positive for postnasal drip. Negative for congestion, dental problem, ear pain, facial swelling, hearing loss, sinus pain, sore throat, trouble swallowing and voice change.   Eyes: Negative for photophobia, pain and visual disturbance.  Respiratory: Positive for cough. Negative for chest tightness, shortness of breath, wheezing and stridor.   Cardiovascular: Negative for chest pain and palpitations.  Gastrointestinal: Negative for diarrhea and vomiting.  Musculoskeletal: Negative for arthralgias and myalgias.  Neurological: Negative for dizziness and headaches.     Physical Exam Triage Vital Signs ED Triage Vitals [05/14/19 1018]  Enc Vitals Group     BP 115/79     Pulse Rate 93     Resp 18     Temp 98.8 F (37.1 C)     Temp Source Oral     SpO2 98 %     Weight      Height      Head Circumference      Peak Flow      Pain Score 0     Pain Loc      Pain Edu?      Excl. in [place]?     No data found.  Updated Vital Signs BP 115/79 (BP Location: Left Arm)   Pulse 93   Temp 98.8 F (37.1 C) (Oral)   Resp 18   LMP 03/01/2019   SpO2 98%    Physical Exam Constitutional:      General: She is not in acute distress.    Appearance: She is not ill-appearing.  HENT:     Head: Normocephalic and atraumatic.     Jaw: There is normal jaw occlusion. No tenderness or pain on movement.     Right Ear: Hearing, tympanic membrane, ear canal and external ear normal. No tenderness. No mastoid tenderness.     Left Ear: Hearing, tympanic membrane, ear canal and external ear normal. No tenderness. No mastoid tenderness.     Nose: Nose normal. No nasal deformity, septal deviation or nasal tenderness.     Right Turbinates: Not swollen or pale.     Left Turbinates: Not swollen or pale.     Right Sinus: No maxillary sinus tenderness or frontal sinus tenderness.     Left Sinus: No maxillary sinus tenderness or frontal sinus tenderness.     Mouth/Throat:     Lips: Pink. No lesions.     Mouth: Mucous membranes are moist. No injury.     Pharynx: Oropharynx is clear. Uvula midline. No posterior oropharyngeal erythema or uvula swelling.     Comments: Cobblestoning present, no tonsillar exudate or hypertrophy Eyes:     General: No scleral icterus.       Right eye: No discharge.        Left eye: No discharge.     Conjunctiva/sclera: Conjunctivae normal.     Pupils: Pupils are equal, round, and reactive to light.  Neck:     Musculoskeletal: Normal range of motion and neck supple. No muscular tenderness.     Comments: Negative JVD Cardiovascular:     Rate and Rhythm: Normal rate and regular rhythm.     Heart sounds: No murmur. No gallop.   Pulmonary:     Effort: Pulmonary effort is normal. No respiratory distress.     Breath sounds: No stridor. No wheezing, rhonchi or rales.     Comments: Good air movement bilaterally without prolonged expiratory phase Lymphadenopathy:  Cervical:  No cervical adenopathy.  Neurological:     Mental Status: She is alert and oriented to person, place, and time.      UC Treatments / Results  Labs (all labs ordered are listed, but only abnormal results are displayed) Labs Reviewed - No data to display  EKG   Radiology No results found.  Procedures Procedures (including critical care time)  Medications Ordered in UC Medications - No data to display  Initial Impression / Assessment and Plan / UC Course  I have reviewed the triage vital signs and the nursing notes.  Pertinent labs & imaging results that were available during my care of the patient were reviewed by me and considered in my medical decision making (see chart for details).     1.  Cough Patient appears well, afebrile, < 1 wk h/o of nonproductive cough.  Will treat supportively at this time.  Provided sheet of OTC medications safe to use in pregnancy.  We will also use her humidified air.  Patient to follow-up with PCP Monday.  Return precautions discussed, patient verbalized understanding and is agreeable to plan. Final Clinical Impressions(s) / UC Diagnoses   Final diagnoses:  Cough     Discharge Instructions     Return for worsening cough, chest pain, fever, difficulty breathing.    ED Prescriptions    None     Controlled Substance Prescriptions [place] Controlled Substance Registry consulted? Not Applicable   Quincy Sheehan, [place] 05/14/19 1052

## 2019-05-14 NOTE — ED Triage Notes (Signed)
Pt c/o dry cough since Sunday. Had a neg COVID test on Tuesday. States she is 10wks preg. Unable to sleep d/t cough. Hx of PNA.

## 2019-06-01 ENCOUNTER — Encounter: Payer: Self-pay | Admitting: Adult Health

## 2019-06-01 ENCOUNTER — Ambulatory Visit (INDEPENDENT_AMBULATORY_CARE_PROVIDER_SITE_OTHER): Payer: 59 | Admitting: Adult Health

## 2019-06-01 ENCOUNTER — Other Ambulatory Visit: Payer: Self-pay

## 2019-06-01 VITALS — Temp 98.0°F | Ht 62.0 in | Wt 159.0 lb

## 2019-06-01 DIAGNOSIS — J329 Chronic sinusitis, unspecified: Secondary | ICD-10-CM

## 2019-06-01 DIAGNOSIS — J3489 Other specified disorders of nose and nasal sinuses: Secondary | ICD-10-CM

## 2019-06-01 DIAGNOSIS — R05 Cough: Secondary | ICD-10-CM | POA: Diagnosis not present

## 2019-06-01 DIAGNOSIS — R059 Cough, unspecified: Secondary | ICD-10-CM

## 2019-06-01 DIAGNOSIS — J310 Chronic rhinitis: Secondary | ICD-10-CM | POA: Insufficient documentation

## 2019-06-01 NOTE — Progress Notes (Signed)
Virtual Visit via Telephone Note  I connected with Tina Wilson on 06/01/19 at  3:00 PM EDT by telephone and verified that I am speaking with the correct person using two identifiers.  Location: Patient: Home Provider: In Clinic   I discussed the limitations, risks, security and privacy concerns of performing an evaluation and management service by telephone and the availability of in person appointments. I also discussed with the patient that there may be a patient responsible charge related to this service. The patient expressed understanding and agreed to proceed.   History of Present Illness: [name] calls in with copious yellow nasal drainage and productive cough (thick/yellow mucus) She is [redacted] weeks pregnant She denies sore throat/fever/dyspnea She denies N/V/D Reduced sense of taste when her nose is "really clogged up", but then taste will return after he nasal passages clear. She works from home and has only been to grocery store a few times in the last month- always wears a mask outside of home. Her daughter is in daycare. She denies any known exposure to SARS-CoV-2 She has not been using any OTC remedies She denies tobacco/vape/ETOH use  05/13/2019 SARS-CoV-2 Negative  05/14/2019 UC Notes- 1.  Cough Patient appears well, afebrile, < 1 wk h/o of nonproductive cough.  Will treat supportively at this time.  Provided sheet of OTC medications safe to use in pregnancy.  We will also use her humidified air.  Patient to follow-up with PCP Monday.  Return precautions discussed, patient verbalized understanding and is agreeable to plan. Patient Care Team    Relationship Specialty Notifications Start End  Mina Marble D, NP PCP - General Family Medicine  06/23/18   [name], MD  Internal Medicine  01/11/13    Comment: Iona Hansen)   Everlene Farrier, MD Consulting Physician Obstetrics and Gynecology  06/23/18     Patient Active Problem List   Diagnosis Date Noted   . Thrombocytopenia ([place]) 08/04/2018  . Elevated LDL cholesterol level 08/04/2018  . Sore throat 07/20/2018  . Cough 07/20/2018  . Migraine without aura and without status migrainosus, not intractable 06/23/2018  . Post-dates pregnancy 10/30/2016  . Healthcare maintenance 01/11/2013     Past Medical History:  Diagnosis Date  . Allergy   . Anxiety   . Asthma   . Depression   . Herpes genitalis in women 2010  . Migraine      Past Surgical History:  Procedure Laterality Date  . NO PAST SURGERIES       Family History  Problem Relation Age of Onset  . Alcohol abuse Maternal Grandmother   . Breast cancer Paternal Grandmother   . Alcohol abuse Paternal Grandfather   . Arthritis Mother   . Depression Mother   . Hyperlipidemia Father   . Hypertension Father   . Alcohol abuse Paternal Aunt   . Heart attack Maternal Grandfather   . Stroke Maternal Grandfather   . Cancer Neg Hx   . COPD Neg Hx   . Diabetes Neg Hx   . Drug abuse Neg Hx   . Early death Neg Hx   . Heart disease Neg Hx   . Kidney disease Neg Hx      Social History   Substance and Sexual Activity  Drug Use No   Comment: valtrex     Social History   Substance and Sexual Activity  Alcohol Use Yes  . Alcohol/week: 14.0 standard drinks  . Types: 4 Standard drinks or equivalent, 10 Glasses of  wine per week     Social History   Tobacco Use  Smoking Status Never Smoker  Smokeless Tobacco Never Used     Outpatient Encounter Medications as of 06/01/2019  Medication Sig  . Prenatal MV-Min-FA-Omega-3 (PRENATAL GUMMIES/DHA & FA PO) Take 2 each by mouth daily.  . [DISCONTINUED] nortriptyline (PAMELOR) 25 MG capsule Take 1 capsule (25 mg total) by mouth at bedtime.  . [DISCONTINUED] SUMAtriptan (IMITREX) 100 MG tablet Take 1 tablet earliest onset of migraine.  May repeat once in 2 hours if headache persists or recurs.  Do not exceed 2 tablets in 24h   No facility-administered encounter medications on  file as of 06/01/2019.     Allergies: Patient has no known allergies.  Body mass index is 29.08 kg/m.  Temperature 98 F (36.7 C), temperature source Oral, height 5\' 2"  (1.575 m), weight 159 lb (72.1 kg), last menstrual period 03/01/2019. Review of Systems: General:   Denies fever, chills, unexplained weight loss.  Optho/Auditory:   Denies visual changes, blurred vision/LOV ENT: Nasal Drainage + Loss of sense of taste + Respiratory:   Denies SOB, DOE more than baseline levels. Productive Cough + Cardiovascular:   Denies chest pain, palpitations, new onset peripheral edema  Gastrointestinal:   Denies nausea, vomiting, diarrhea.  Genitourinary: Denies dysuria, freq/ urgency, flank pain or discharge from genitals.  Endocrine:     Denies hot or cold intolerance, polyuria, polydipsia. Musculoskeletal:   Denies unexplained myalgias, joint swelling, unexplained arthralgias, gait problems.  Skin:  Denies rash, suspicious lesions Neurological: Denies dizziness, unexplained weakness, numbness  Psychiatric/Behavioral:   Denies mood changes, suicidal or homicidal ideations, hallucinations    Observations/Objective: Pt sounds quite congested   Assessment and Plan: Proceed to [place] for SARS-CoV-2 testing ASAP- follow quarantine procedures Increase fluids, rest, Neti Pot, use humidifier, apply thin layer of Vicks Vapor to chest, and underneath nose Will call with SARS-CoV-2 testing results- will refrain from ABX treatment for now  Follow Up Instructions: PRN   I discussed the assessment and treatment plan with the patient. The patient was provided an opportunity to ask questions and all were answered. The patient agreed with the plan and demonstrated an understanding of the instructions.   The patient was advised to call back or seek an in-person evaluation if the symptoms worsen or if the condition fails to improve as anticipated.  I provided 12 minutes of non-face-to-face time during this  encounter.   Esaw Grandchild, NP

## 2019-06-01 NOTE — Assessment & Plan Note (Signed)
Assessment and Plan: [redacted] Weeks Pregnant  Proceed to [place] for SARS-CoV-2 testing ASAP- follow quarantine procedures Increase fluids, rest, Neti Pot, use humidifier, apply thin layer of Vicks Vapor to chest, and underneath nose Will call with SARS-CoV-2 testing results- will refrain from ABX treatment for now  Follow Up Instructions: PRN   I discussed the assessment and treatment plan with the patient. The patient was provided an opportunity to ask questions and all were answered. The patient agreed with the plan and demonstrated an understanding of the instructions.   The patient was advised to call back or seek an in-person evaluation if the symptoms worsen or if the condition fails to improve as anticipated.

## 2019-06-01 NOTE — Assessment & Plan Note (Signed)
Assessment and Plan: [redacted] Weeks pregnant  Proceed to [place] for SARS-CoV-2 testing ASAP- follow quarantine procedures Increase fluids, rest, Neti Pot, use humidifier, apply thin layer of Vicks Vapor to chest, and underneath nose Will call with SARS-CoV-2 testing results- will refrain from ABX treatment for now  Follow Up Instructions: PRN   I discussed the assessment and treatment plan with the patient. The patient was provided an opportunity to ask questions and all were answered. The patient agreed with the plan and demonstrated an understanding of the instructions.   The patient was advised to call back or seek an in-person evaluation if the symptoms worsen or if the condition fails to improve as anticipated.

## 2019-06-02 ENCOUNTER — Other Ambulatory Visit: Payer: Self-pay

## 2019-06-02 DIAGNOSIS — Z20822 Contact with and (suspected) exposure to covid-19: Secondary | ICD-10-CM

## 2019-06-03 LAB — NOVEL CORONAVIRUS, NAA: SARS-CoV-2, NAA: NOT DETECTED

## 2019-11-01 LAB — OB RESULTS CONSOLE GBS: GBS: NEGATIVE

## 2019-11-11 ENCOUNTER — Telehealth: Payer: Self-pay | Admitting: Hematology

## 2019-11-11 NOTE — Telephone Encounter (Signed)
Received an urgent hem referral from Dr. Gaetano Net for thrombocytopenia in pregnancy. Mrs. Amorosa has been scheduled to see Dr. Irene Limbo on 3/3 at 11am. I was unable to reach the pt but lft the appt date and time on her vm. I also lft Johny Shears, referral coordinator from [facility], a voicemail to call and confirm the appt date and time with the pt. Pt has an active mychart acct. Will send her a msg regarding the appt as well.

## 2019-11-11 NOTE — Telephone Encounter (Signed)
Pt returned my call and has confirmed appt w/Dr. Irene Limbo on 3/3 at 11am. She's been made aware to arrive 15 minutes early.

## 2019-11-17 ENCOUNTER — Other Ambulatory Visit: Payer: Self-pay

## 2019-11-17 ENCOUNTER — Inpatient Hospital Stay: Payer: 59

## 2019-11-17 ENCOUNTER — Inpatient Hospital Stay: Payer: 59 | Attending: Hematology | Admitting: Hematology

## 2019-11-17 VITALS — BP 114/80 | HR 93 | Temp 97.1°F | Resp 18 | Wt 177.1 lb

## 2019-11-17 DIAGNOSIS — D696 Thrombocytopenia, unspecified: Secondary | ICD-10-CM

## 2019-11-17 DIAGNOSIS — Z3A37 37 weeks gestation of pregnancy: Secondary | ICD-10-CM | POA: Insufficient documentation

## 2019-11-17 DIAGNOSIS — O99113 Other diseases of the blood and blood-forming organs and certain disorders involving the immune mechanism complicating pregnancy, third trimester: Secondary | ICD-10-CM | POA: Insufficient documentation

## 2019-11-17 LAB — CMP (CANCER CENTER ONLY)
ALT: 11 U/L (ref 0–44)
AST: 15 U/L (ref 15–41)
Albumin: 3.3 g/dL — ABNORMAL LOW (ref 3.5–5.0)
Alkaline Phosphatase: 166 U/L — ABNORMAL HIGH (ref 38–126)
Anion gap: 8 (ref 5–15)
BUN: 5 mg/dL — ABNORMAL LOW (ref 6–20)
CO2: 22 mmol/L (ref 22–32)
Calcium: 9.5 mg/dL (ref 8.9–10.3)
Chloride: 106 mmol/L (ref 98–111)
Creatinine: 0.6 mg/dL (ref 0.44–1.00)
GFR, Est AFR Am: >60 mL/min (ref >60)
GFR, Estimated: >60 mL/min (ref >60)
Glucose, Bld: 80 mg/dL (ref 70–99)
Potassium: 4.6 mmol/L (ref 3.5–5.1)
Sodium: 136 mmol/L (ref 135–145)
Total Bilirubin: 0.3 mg/dL (ref 0.3–1.2)
Total Protein: 7.2 g/dL (ref 6.5–8.1)

## 2019-11-17 LAB — CBC WITH DIFFERENTIAL/PLATELET
Abs Immature Granulocytes: 0.09 10*3/uL — ABNORMAL HIGH (ref 0.00–0.07)
Basophils Absolute: 0 10*3/uL (ref 0.0–0.1)
Basophils Relative: 0 %
Eosinophils Absolute: 0.1 10*3/uL (ref 0.0–0.5)
Eosinophils Relative: 1 %
HCT: 38.2 % (ref 36.0–46.0)
Hemoglobin: 12.6 g/dL (ref 12.0–15.0)
Immature Granulocytes: 1 %
Lymphocytes Relative: 21 %
Lymphs Abs: 2.3 10*3/uL (ref 0.7–4.0)
MCH: 30.5 pg (ref 26.0–34.0)
MCHC: 33 g/dL (ref 30.0–36.0)
MCV: 92.5 fL (ref 80.0–100.0)
Monocytes Absolute: 0.8 10*3/uL (ref 0.1–1.0)
Monocytes Relative: 7 %
Neutro Abs: 7.5 10*3/uL (ref 1.7–7.7)
Neutrophils Relative %: 70 %
Platelets: 113 10*3/uL — ABNORMAL LOW (ref 150–400)
RBC: 4.13 MIL/uL (ref 3.87–5.11)
RDW: 13 % (ref 11.5–15.5)
WBC: 10.7 10*3/uL — ABNORMAL HIGH (ref 4.0–10.5)
nRBC: 0 % (ref 0.0–0.2)

## 2019-11-17 LAB — IMMATURE PLATELET FRACTION: Immature Platelet Fraction: 23.9 % — ABNORMAL HIGH (ref 1.2–8.6)

## 2019-11-17 LAB — VITAMIN B12: Vitamin B-12: 255 pg/mL (ref 180–914)

## 2019-11-17 LAB — PLATELET BY CITRATE

## 2019-11-17 NOTE — Patient Instructions (Signed)
Thank you for choosing [facility] to provide your oncology and hematology care.   Should you have questions after your visit to the [facility] ([facility]), please contact this office at [phone] between 8:30 AM and 4:30 PM.  Voice mails left after 4:00 PM may not be returned until the following business day.  Calls received after 4:30 PM will be answered by an off-site Nurse Triage Line.    Prescription Refills:  Please have your pharmacy contact us directly for most prescription requests.  Contact the office directly for refills of narcotics (pain medications). Allow 48-72 hours for refills.  Appointments: Please contact the [facility] scheduling department [phone] for questions regarding [facility] appointment scheduling.  Contact the schedulers with any scheduling changes so that your appointment can be rescheduled in a timely manner.   Central Scheduling for [place] [phone] - Call to schedule procedures such as PET scans, CT scans, MRI, Ultrasound, etc.  To afford each patient quality time with our providers, please arrive 30 minutes before your scheduled appointment time.  If you arrive late for your appointment, you may be asked to reschedule.  We strive to give you quality time with our providers, and arriving late affects you and other patients whose appointments are after yours. If you are a no show for multiple scheduled visits, you may be dismissed from the clinic at the providers discretion.     Resources: [facility] Social Workers [phone] for additional information on assistance programs or assistance connecting with community support programs   [place] DSS  336-641-3447: Information regarding food stamps, Medicaid, and utility assistance [place] 336-333-6589   [place] [employer]'s shared-ride transportation service for eligible riders who have a disability that prevents them from riding the fixed route bus.   Medicare Rights Center  800-333-4114 Helps people with Medicare understand their rights and benefits, navigate the Medicare system, and secure the quality healthcare they deserve [employer] 800-227-2345 Assists patients locate various types of support and financial assistance Cancer Care: 1-800-813-HOPE (4673) Provides financial assistance, online support groups, medication/co-pay assistance.   Transportation Assistance for appointments at [facility]: Transportation Coordinator [phone]  Again, thank you for choosing [facility] for your care.       

## 2019-11-17 NOTE — Progress Notes (Signed)
HEMATOLOGY/ONCOLOGY CONSULTATION NOTE  Date of Service: 11/17/2019  Patient Care Team: Patient, No Pcp Per as PCP - General (General Practice) Tina Lima, MD (Internal Medicine) Tina Farrier, MD as Consulting Physician (Obstetrics and Gynecology)  CHIEF COMPLAINTS/PURPOSE OF CONSULTATION:  Thrombocytopenia in pregnancy  HISTORY OF PRESENTING ILLNESS:   Tina Wilson is a wonderful 34 y.o. female who has been referred to [place] by Dr. Gaetano Net for evaluation and management of thrombocytopenia in pregnancy. The pt reports that she is doing well overall.   The pt reports that she is currently [redacted] weeks pregnant. She has a history of migraines and has been seen by a Neurologist who told her to drink less coffee. Pt has not had a lot of migraines during her pregnancy but has has some headaches. She has only had to take OTC pain medication three times during pregnancy. She denies any infections during pregnancy, including UTIs. She has been taking prenatal gummies as recommended and was started on Valtrex 500 mg about a week ago. Pt has been pregnant three times and this will be her second child. She had a miscarriage with her second pregnancy and was never given a definitive reason as to why. She denies any issues with thrombocytopenia in between pregnancies or prior to pregnancy. She has not had no previous issues with vitamin deficiencies.   Her mother has von Willebrand disease and bleeds easily when cut but has never had to take medication to treat. Pt has never been tested for it and denies similar bleeding concerns. She had an episiotomy with her first delivery and has had wisdom tooth extractions and did not bleed abnormally with either procedure.    Most recent lab results (11/08/2019) of CBC is as follows: all values are WNL except for WBC at 12.0K, PLT at 91K.  On review of systems, pt reports low back pain, headaches and denies bleeding concerns, bruising, leg swelling and  any other symptoms.   On PMHx the pt reports Migraines, Asthma, Genital Herpes, Episiotomy, Wisdom Tooth Extraction. On Family Hx the pt reports that her mother has von Willebrand  disease.  MEDICAL HISTORY:  Past Medical History:  Diagnosis Date  . Allergy   . Anxiety   . Asthma   . Depression   . Herpes genitalis in women 2010  . Migraine     SURGICAL HISTORY: Past Surgical History:  Procedure Laterality Date  . NO PAST SURGERIES      SOCIAL HISTORY: Social History   Socioeconomic History  . Marital status: Married    Spouse name: Christia Reading  . Number of children: 1  . Years of education: Not on file  . Highest education level: Bachelor's degree (e.g., BA, AB, BS)  Occupational History  . Occupation: Pharmacologist  Tobacco Use  . Smoking status: Never Smoker  . Smokeless tobacco: Never Used  Substance and Sexual Activity  . Alcohol use: Yes    Alcohol/week: 14.0 standard drinks    Types: 4 Standard drinks or equivalent, 10 Glasses of wine per week  . Drug use: No    Comment: valtrex  . Sexual activity: Yes    Partners: Male    Birth control/protection: None    Comment: married.  Other Topics Concern  . Not on file  Social History Narrative   ** Merged History Encounter **       Marital status: married      Children: none      Living with: husband  Employment: works from [employer].      Tobacco: none       Alcohol: 2 glasses wine nightly      Drugs: none      Exercise: sporadically      Patient is right-handed. She lives with her husband and daughter in a 2 story home. She drinks 1-2 cups of coffee a day and rare tea or soda. She does not exercise.   Social Determinants of Health   Financial Resource Strain:   . Difficulty of Paying Living Expenses: Not on file  Food Insecurity:   . Worried About Charity fundraiser in the Last Year: Not on file  . Ran Out of Food in the Last Year: Not on file  Transportation Needs:   . Lack of  Transportation (Medical): Not on file  . Lack of Transportation (Non-Medical): Not on file  Physical Activity:   . Days of Exercise per Week: Not on file  . Minutes of Exercise per Session: Not on file  Stress:   . Feeling of Stress : Not on file  Social Connections:   . Frequency of Communication with Friends and Family: Not on file  . Frequency of Social Gatherings with Friends and Family: Not on file  . Attends Religious Services: Not on file  . Active Member of Clubs or Organizations: Not on file  . Attends Archivist Meetings: Not on file  . Marital Status: Not on file  Intimate Partner Violence:   . Fear of Current or Ex-Partner: Not on file  . Emotionally Abused: Not on file  . Physically Abused: Not on file  . Sexually Abused: Not on file    FAMILY HISTORY: Family History  Problem Relation Age of Onset  . Alcohol abuse Maternal Grandmother   . Breast cancer Paternal Grandmother   . Alcohol abuse Paternal Grandfather   . Arthritis Mother   . Depression Mother   . Hyperlipidemia Father   . Hypertension Father   . Alcohol abuse Paternal Aunt   . Heart attack Maternal Grandfather   . Stroke Maternal Grandfather   . Cancer Neg Hx   . COPD Neg Hx   . Diabetes Neg Hx   . Drug abuse Neg Hx   . Early death Neg Hx   . Heart disease Neg Hx   . Kidney disease Neg Hx     ALLERGIES:  has No Known Allergies.  MEDICATIONS:  Current Outpatient Medications  Medication Sig Dispense Refill  . Prenatal MV-Min-FA-Omega-3 (PRENATAL GUMMIES/DHA & FA PO) Take 2 each by mouth daily.    . valACYclovir (VALTREX) 500 MG tablet Take 500 mg by mouth daily.     No current facility-administered medications for this visit.    REVIEW OF SYSTEMS:    10 Point review of Systems was done is negative except as noted above.  PHYSICAL EXAMINATION: ECOG PERFORMANCE STATUS: 1 - Symptomatic but completely ambulatory  . Vitals:   11/17/19 1132  BP: 114/80  Pulse: 93  Resp: 18   Temp: (!) 97.1 F (36.2 C)  SpO2: 99%   Filed Weights   11/17/19 1132  Weight: 177 lb 1.6 oz (80.3 kg)   .Body mass index is 32.39 kg/m.  Exam was given in a chair   GENERAL:alert, in no acute distress and comfortable SKIN: no acute rashes, no significant lesions EYES: conjunctiva are pink and non-injected, sclera anicteric OROPHARYNX: MMM, no exudates, no oropharyngeal erythema or ulceration NECK: supple, no JVD LYMPH:  no palpable lymphadenopathy in the cervical, axillary or inguinal regions LUNGS: clear to auscultation b/l with normal respiratory effort HEART: regular rate & rhythm ABDOMEN:  normoactive bowel sounds , non tender, not distended. Extremity: no pedal edema PSYCH: alert & oriented x 3 with fluent speech NEURO: no focal motor/sensory deficits  LABORATORY DATA:  I have reviewed the data as listed  . CBC Latest Ref Rng & Units 11/17/2019 07/16/2018 10/31/2016  WBC 4.0 - 10.5 K/uL 10.7(H) 5.1 19.8(H)  Hemoglobin 12.0 - 15.0 g/dL 12.6 12.6 10.8(L)  Hematocrit 36.0 - 46.0 % 38.2 36.7 29.7(L)  Platelets 150 - 400 K/uL 113(L) 133(L) 125(L)    . CMP Latest Ref Rng & Units 11/17/2019 07/16/2018 01/11/2013  Glucose 70 - 99 mg/dL 80 98 86  BUN 6 - 20 mg/dL 5(L) 10 11  Creatinine 0.44 - 1.00 mg/dL 0.60 0.67 0.6  Sodium 135 - 145 mmol/L 136 138 137  Potassium 3.5 - 5.1 mmol/L 4.6 4.2 4.2  Chloride 98 - 111 mmol/L 106 101 104  CO2 22 - 32 mmol/L 22 23 28   Calcium 8.9 - 10.3 mg/dL 9.5 9.2 9.0  Total Protein 6.5 - 8.1 g/dL 7.2 7.3 7.4  Total Bilirubin 0.3 - 1.2 mg/dL 0.3 0.6 0.7  Alkaline Phos 38 - 126 U/L 166(H) 46 42  AST 15 - 41 U/L 15 18 23   ALT 0 - 44 U/L 11 17 26    Component     Latest Ref Rng & Units 11/17/2019  Vitamin B12     180 - 914 pg/mL 255  Immature Platelet Fraction     1.2 - 8.6 % 23.9 (H)    RADIOGRAPHIC STUDIES: I have personally reviewed the radiological images as listed and agreed with the findings in the report. No results  found.  ASSESSMENT & PLAN:   34 yo female who is [redacted] weeks pregnant with   1) Thrombocytopenia in 3rd trimester of pregnancy - likely gestation thrombocytopenia. Patient did not have thrombocytopenia in 1st trimester making ITP less likely. PLAN: -Discussed patient's most recent labs from 11/08/2019, all values are WNL except for WBC at 12.0K, PLT at 91K. -Advised pt that her PLT were normal in her first trimester of pregnancy (169K) and the recent change is most likely from gestational thrombocytopenia -Advised pt that there was some PLT aggregation on her last labs - could be some element of pseudothrombocytopenia -Advised pt that Valtrex can cause some thrombocytopenia, though not likely at her 500 mg daily dose -Advised pt that if PLT <70K there would be another explanation of thrombocytopenia -Advised pt that we would like PLT >75K-80K for spinal or epidurals -Advised pt that PLT <80K that increases the risks of traumatic bleeding -Advised pt that they could give PLT if <70K for epidural or spinal injection  -Decided not complete von Willebrand's testing today as pt is not at her baseline and she is uncertain of this fhx and has never had bleeding issues including with her previous pregnancy and episiotomy. -Recommend pt avoid OTC pain medications -Will get labs today - collected in a citrate tube  -Will see back as needed based on labs    FOLLOW UP: Labs today RTC with Dr Irene Limbo based on labs  All of the patients questions were answered with apparent satisfaction. The patient knows to call the clinic with any problems, questions or concerns.  I spent 35 mins counseling the patient face to face. The total time spent in the appointment was 45 mintues  and  more than 50% was on counseling and direct patient cares.    Sullivan Lone MD [place] AAHIVMS [facility] Hematology/Oncology Physician [facility]  (Office):       [phone] (Work cell):  [phone] (Fax):            [phone]  11/17/2019 5:00 PM  I, Yevette Edwards, am acting as a scribe for Dr. Sullivan Lone.   .I have reviewed the above documentation for accuracy and completeness, and I agree with the above. Brunetta Genera MD   ADDENDUM  . CBC Latest Ref Rng & Units 11/17/2019 07/16/2018 10/31/2016  WBC 4.0 - 10.5 K/uL 10.7(H) 5.1 19.8(H)  Hemoglobin 12.0 - 15.0 g/dL 12.6 12.6 10.8(L)  Hematocrit 36.0 - 46.0 % 38.2 36.7 29.7(L)  Platelets 150 - 400 K/uL 113(L) 133(L) 125(L)    B12 -- 255  PLAN -mild thrombocytopenia PLT 113k consistent with gestation thrombocytopenia. There was an element of pseudothrombocytopenia with platelet clumping on previous labs showing platelet count of 91k. -no intervention recommended with regards to platelets -recommend B12 500 mcg po daily for B12 deficiency  .Brunetta Genera MD

## 2019-11-24 ENCOUNTER — Telehealth: Payer: Self-pay

## 2019-11-24 NOTE — Telephone Encounter (Signed)
TCT patient regarding her labs unable to reach. LVM with Dr. Grier Mitts recommendations below.

## 2019-11-24 NOTE — Telephone Encounter (Signed)
-----   Message from Rolland Bimler, RN sent at 11/24/2019  9:13 AM EST ----- Regarding: RE: result note  ----- Message ----- From: Brunetta Genera, MD Sent: 11/23/2019   9:38 PM EST To: Rolland Bimler, RN  -mild thrombocytopenia PLT 113k consistent with gestation thrombocytopenia. There was an element of pseudothrombocytopenia with platelet clumping on previous labs showing platelet count of 91k. -no intervention recommended with regards to platelets -recommend B12 500 mcg po daily for B12 deficiency

## 2019-11-25 ENCOUNTER — Other Ambulatory Visit: Payer: Self-pay | Admitting: *Deleted

## 2019-11-25 ENCOUNTER — Encounter: Payer: Self-pay | Admitting: Hematology

## 2019-11-25 DIAGNOSIS — D696 Thrombocytopenia, unspecified: Secondary | ICD-10-CM

## 2019-11-26 ENCOUNTER — Inpatient Hospital Stay: Payer: 59

## 2019-11-26 ENCOUNTER — Telehealth: Payer: Self-pay | Admitting: *Deleted

## 2019-11-26 ENCOUNTER — Other Ambulatory Visit: Payer: Self-pay

## 2019-11-26 DIAGNOSIS — O99113 Other diseases of the blood and blood-forming organs and certain disorders involving the immune mechanism complicating pregnancy, third trimester: Secondary | ICD-10-CM | POA: Diagnosis not present

## 2019-11-26 DIAGNOSIS — D696 Thrombocytopenia, unspecified: Secondary | ICD-10-CM

## 2019-11-26 LAB — CBC WITH DIFFERENTIAL (CANCER CENTER ONLY)
Abs Immature Granulocytes: 0.07 10*3/uL (ref 0.00–0.07)
Basophils Absolute: 0 10*3/uL (ref 0.0–0.1)
Basophils Relative: 0 %
Eosinophils Absolute: 0.1 10*3/uL (ref 0.0–0.5)
Eosinophils Relative: 1 %
HCT: 35.5 % — ABNORMAL LOW (ref 36.0–46.0)
Hemoglobin: 11.6 g/dL — ABNORMAL LOW (ref 12.0–15.0)
Immature Granulocytes: 1 %
Lymphocytes Relative: 20 %
Lymphs Abs: 2.1 10*3/uL (ref 0.7–4.0)
MCH: 30.1 pg (ref 26.0–34.0)
MCHC: 32.7 g/dL (ref 30.0–36.0)
MCV: 92.2 fL (ref 80.0–100.0)
Monocytes Absolute: 0.8 10*3/uL (ref 0.1–1.0)
Monocytes Relative: 7 %
Neutro Abs: 7.4 10*3/uL (ref 1.7–7.7)
Neutrophils Relative %: 71 %
Platelet Count: 86 10*3/uL — ABNORMAL LOW (ref 150–400)
RBC: 3.85 MIL/uL — ABNORMAL LOW (ref 3.87–5.11)
RDW: 13.2 % (ref 11.5–15.5)
WBC Count: 10.4 10*3/uL (ref 4.0–10.5)
nRBC: 0 % (ref 0.0–0.2)

## 2019-11-26 LAB — PLATELET BY CITRATE

## 2019-11-26 NOTE — Telephone Encounter (Signed)
Results of CBC/diff & Plts by Citrate faxed to Dr. Sherlynn Stalls office attn Dorian Pod @ [phone] as requested. Fax confirmation received.

## 2019-11-29 ENCOUNTER — Encounter (HOSPITAL_COMMUNITY): Payer: Self-pay | Admitting: *Deleted

## 2019-11-29 ENCOUNTER — Telehealth (HOSPITAL_COMMUNITY): Payer: Self-pay | Admitting: *Deleted

## 2019-11-29 NOTE — Telephone Encounter (Signed)
Preadmission screen  

## 2019-11-30 ENCOUNTER — Inpatient Hospital Stay (HOSPITAL_COMMUNITY): Payer: 59

## 2019-11-30 ENCOUNTER — Other Ambulatory Visit (HOSPITAL_COMMUNITY)
Admission: RE | Admit: 2019-11-30 | Discharge: 2019-11-30 | Disposition: A | Discharge status: Home / Self Care | Payer: 59 | Source: Ambulatory Visit | Attending: Obstetrics and Gynecology | Admitting: Obstetrics and Gynecology

## 2019-11-30 LAB — SARS CORONAVIRUS 2 (TAT 6-24 HRS): SARS Coronavirus 2: NEGATIVE

## 2019-11-30 NOTE — H&P (Signed)
Tina Wilson is a 34 y.o. female presenting for IOL. Pregnancy complicated by thrombocytopenia. On 04/27/19 platelets=169. On 09/01/19=103,  On 09/20/19=119, 11/08/19=91, 11/23/19=73, 11/26/19=86 (citrate). Platelets were normal with pregnancy in '17. Heme consult 11/17/19 felt it is gestational thrombocytopenia with a component of clumping (pseudothrombocytopenia). She denies HA or vision changes and BP in office is 124/80. Also Hx of genital HSV with no outbreak in years and no prodromal sxs, on Valtrex suppression. History of migraine HA. OB History    Gravida  2   Para  1   Term  1   Preterm      AB      Living  1     SAB      TAB      Ectopic      Multiple  0   Live Births  1          Past Medical History:  Diagnosis Date  . Allergy   . Anxiety   . Asthma   . Depression   . Herpes genitalis in women 2010  . Migraine   . Thrombocytopenic disorder [facility])    Past Surgical History:  Procedure Laterality Date  . NO PAST SURGERIES     Family History: family history includes Alcohol abuse in her maternal grandmother, paternal aunt, and paternal grandfather; Arthritis in her mother; Breast cancer in her paternal grandmother; Depression in her mother; Heart attack in her maternal grandfather; Hyperlipidemia in her father; Hypertension in her father; Stroke in her maternal grandfather. Social History:  reports that she has never smoked. She has never used smokeless tobacco. She reports current alcohol use of about 14.0 standard drinks of alcohol per week. She reports that she does not use drugs.     Maternal Diabetes: No Genetic Screening: Declined Maternal Ultrasounds/Referrals: Normal Fetal Ultrasounds or other Referrals:  Other: Heme Maternal Substance Abuse:  No Significant Maternal Medications:  Meds include: Other: Valtrex Significant Maternal Lab Results:  Group B Strep negative Other Comments:  None  Review of Systems  Eyes: Negative for visual  disturbance.  Gastrointestinal: Negative for abdominal pain.  Neurological: Negative for headaches.   Maternal Medical History:  Fetal activity: Perceived fetal activity is normal.        Last menstrual period 03/01/2019. Maternal Exam:  Abdomen: Fetal presentation: vertex     Physical Exam  Cardiovascular: Normal rate.  Respiratory: Effort normal.  GI: Soft.    Cx 0/20%/-3 in office 11/30/19  Prenatal labs: ABO, Rh: O/Positive/-- (08/11 0000) Antibody: Negative (08/11 0000) Rubella: Immune (08/11 0000) RPR: Nonreactive (08/11 0000)  HBsAg: Negative (08/11 0000)  HIV: Non-reactive (08/11 0000)  GBS: Negative/-- (02/15 0000)   Assessment/Plan: 34 yo G3P1 for two stage IOL Gestational Thrombocytopenia-check platelets by citrate, CMET ordered Anesthesia consult re: regional anesthesia    [name] 11/30/2019, 5:59 PM

## 2019-12-01 ENCOUNTER — Inpatient Hospital Stay (HOSPITAL_COMMUNITY)
Admission: RE | Admit: 2019-12-01 | Discharge: 2019-12-03 | DRG: 787 | Disposition: A | Discharge status: Home / Self Care | Payer: 59 | Attending: Obstetrics and Gynecology | Admitting: Obstetrics and Gynecology

## 2019-12-01 ENCOUNTER — Other Ambulatory Visit: Payer: Self-pay

## 2019-12-01 ENCOUNTER — Inpatient Hospital Stay (HOSPITAL_COMMUNITY): Payer: 59 | Admitting: Anesthesiology

## 2019-12-01 ENCOUNTER — Inpatient Hospital Stay (HOSPITAL_COMMUNITY): Payer: 59

## 2019-12-01 ENCOUNTER — Encounter (HOSPITAL_COMMUNITY)
Admission: RE | Disposition: A | Discharge status: Home / Self Care | Payer: Self-pay | Source: Home / Self Care | Attending: Obstetrics and Gynecology

## 2019-12-01 ENCOUNTER — Encounter (HOSPITAL_COMMUNITY): Payer: Self-pay | Admitting: Obstetrics and Gynecology

## 2019-12-01 DIAGNOSIS — A6000 Herpesviral infection of urogenital system, unspecified: Secondary | ICD-10-CM | POA: Diagnosis present

## 2019-12-01 DIAGNOSIS — O9912 Other diseases of the blood and blood-forming organs and certain disorders involving the immune mechanism complicating childbirth: Principal | ICD-10-CM | POA: Diagnosis present

## 2019-12-01 DIAGNOSIS — D6959 Other secondary thrombocytopenia: Secondary | ICD-10-CM | POA: Diagnosis present

## 2019-12-01 DIAGNOSIS — D696 Thrombocytopenia, unspecified: Secondary | ICD-10-CM

## 2019-12-01 DIAGNOSIS — Z20822 Contact with and (suspected) exposure to covid-19: Secondary | ICD-10-CM | POA: Diagnosis present

## 2019-12-01 DIAGNOSIS — Z3A39 39 weeks gestation of pregnancy: Secondary | ICD-10-CM | POA: Diagnosis not present

## 2019-12-01 DIAGNOSIS — Z3490 Encounter for supervision of normal pregnancy, unspecified, unspecified trimester: Secondary | ICD-10-CM

## 2019-12-01 DIAGNOSIS — Z98891 History of uterine scar from previous surgery: Secondary | ICD-10-CM

## 2019-12-01 DIAGNOSIS — O9832 Other infections with a predominantly sexual mode of transmission complicating childbirth: Secondary | ICD-10-CM | POA: Diagnosis present

## 2019-12-01 LAB — CBC
HCT: 35.8 % — ABNORMAL LOW (ref 36.0–46.0)
HCT: 34.4 % — ABNORMAL LOW (ref 36.0–46.0)
HCT: 31.2 % — ABNORMAL LOW (ref 36.0–46.0)
Hemoglobin: 11.7 g/dL — ABNORMAL LOW (ref 12.0–15.0)
Hemoglobin: 11.2 g/dL — ABNORMAL LOW (ref 12.0–15.0)
Hemoglobin: 10.1 g/dL — ABNORMAL LOW (ref 12.0–15.0)
MCH: 31 pg (ref 26.0–34.0)
MCH: 30.4 pg (ref 26.0–34.0)
MCH: 30.5 pg (ref 26.0–34.0)
MCHC: 32.7 g/dL (ref 30.0–36.0)
MCHC: 32.6 g/dL (ref 30.0–36.0)
MCHC: 32.4 g/dL (ref 30.0–36.0)
MCV: 95 fL (ref 80.0–100.0)
MCV: 93.2 fL (ref 80.0–100.0)
MCV: 94.3 fL (ref 80.0–100.0)
Platelets: 120 10*3/uL — ABNORMAL LOW (ref 150–400)
Platelets: 114 10*3/uL — ABNORMAL LOW (ref 150–400)
Platelets: 109 10*3/uL — ABNORMAL LOW (ref 150–400)
RBC: 3.77 MIL/uL — ABNORMAL LOW (ref 3.87–5.11)
RBC: 3.69 MIL/uL — ABNORMAL LOW (ref 3.87–5.11)
RBC: 3.31 MIL/uL — ABNORMAL LOW (ref 3.87–5.11)
RDW: 13.2 % (ref 11.5–15.5)
RDW: 13 % (ref 11.5–15.5)
RDW: 13 % (ref 11.5–15.5)
WBC: 10.1 10*3/uL (ref 4.0–10.5)
WBC: 9.8 10*3/uL (ref 4.0–10.5)
WBC: 27.7 10*3/uL — ABNORMAL HIGH (ref 4.0–10.5)
nRBC: 0 % (ref 0.0–0.2)
nRBC: 0 % (ref 0.0–0.2)
nRBC: 0 % (ref 0.0–0.2)

## 2019-12-01 LAB — COMPREHENSIVE METABOLIC PANEL
ALT: 18 U/L (ref 0–44)
AST: 19 U/L (ref 15–41)
Albumin: 2.9 g/dL — ABNORMAL LOW (ref 3.5–5.0)
Alkaline Phosphatase: 159 U/L — ABNORMAL HIGH (ref 38–126)
Anion gap: 11 (ref 5–15)
BUN: 8 mg/dL (ref 6–20)
CO2: 22 mmol/L (ref 22–32)
Calcium: 9.5 mg/dL (ref 8.9–10.3)
Chloride: 105 mmol/L (ref 98–111)
Creatinine, Ser: 0.61 mg/dL (ref 0.44–1.00)
GFR calc Af Amer: >60 mL/min (ref >60)
GFR calc non Af Amer: >60 mL/min (ref >60)
Glucose, Bld: 102 mg/dL — ABNORMAL HIGH (ref 70–99)
Potassium: 4.7 mmol/L (ref 3.5–5.1)
Sodium: 138 mmol/L (ref 135–145)
Total Bilirubin: 0.4 mg/dL (ref 0.3–1.2)
Total Protein: 6.4 g/dL — ABNORMAL LOW (ref 6.5–8.1)

## 2019-12-01 LAB — PLATELET BY CITRATE
Platelet CT in Citrate: 113
Platelet CT in Citrate: 73

## 2019-12-01 LAB — RPR: RPR Ser Ql: NONREACTIVE

## 2019-12-01 LAB — ABO/RH: ABO/RH(D): O POS

## 2019-12-01 LAB — PREPARE RBC (CROSSMATCH)

## 2019-12-01 SURGERY — Surgical Case
Anesthesia: Regional | Wound class: Clean Contaminated

## 2019-12-01 MED ORDER — DIPHENHYDRAMINE HCL 50 MG/ML IJ SOLN: 12.5000 mg | INTRAMUSCULAR | Status: DC | PRN

## 2019-12-01 MED ORDER — ACETAMINOPHEN 500 MG PO TABS: 1000.0000 mg | ORAL_TABLET | Freq: Once | ORAL | Status: DC

## 2019-12-01 MED ORDER — SODIUM BICARBONATE 8.4 % IV SOLN
INTRAVENOUS | Status: DC | PRN
  Administered 2019-12-01 (×2): 10 mL via EPIDURAL

## 2019-12-01 MED ORDER — ACETAMINOPHEN 325 MG PO TABS: 650.0000 mg | ORAL_TABLET | ORAL | Status: DC | PRN

## 2019-12-01 MED ORDER — CEFAZOLIN SODIUM-DEXTROSE 2-3 GM-%(50ML) IV SOLR
INTRAVENOUS | Status: DC | PRN
  Administered 2019-12-01: 2 g via INTRAVENOUS

## 2019-12-01 MED ORDER — ACETAMINOPHEN 160 MG/5ML PO SOLN: 1000.0000 mg | Freq: Once | ORAL | Status: DC

## 2019-12-01 MED ORDER — SODIUM CHLORIDE 0.9 % IV SOLN: 10.0000 mL/h | Freq: Once | INTRAVENOUS | Status: DC

## 2019-12-01 MED ORDER — CHLOROPROCAINE HCL (PF) 3 % IJ SOLN
INTRAMUSCULAR | Status: AC
  Filled 2019-12-01: qty 20

## 2019-12-01 MED ORDER — ONDANSETRON HCL 4 MG/2ML IJ SOLN
INTRAMUSCULAR | Status: DC | PRN
  Administered 2019-12-01: 4 mg via INTRAVENOUS

## 2019-12-01 MED ORDER — PHENYLEPHRINE 40 MCG/ML (10ML) SYRINGE FOR IV PUSH (FOR BLOOD PRESSURE SUPPORT)
PREFILLED_SYRINGE | INTRAVENOUS | Status: AC
  Filled 2019-12-01: qty 10

## 2019-12-01 MED ORDER — FENTANYL CITRATE (PF) 100 MCG/2ML IJ SOLN: 25.0000 ug | INTRAMUSCULAR | Status: DC | PRN

## 2019-12-01 MED ORDER — SIMETHICONE 80 MG PO CHEW
80.0000 mg | CHEWABLE_TABLET | Freq: Three times a day (TID) | ORAL | Status: DC
  Administered 2019-12-02 – 2019-12-03 (×4): 80 mg via ORAL
  Filled 2019-12-01 (×4): qty 1

## 2019-12-01 MED ORDER — TERBUTALINE SULFATE 1 MG/ML IJ SOLN
0.2500 mg | Freq: Once | INTRAMUSCULAR | Status: DC | PRN
  Filled 2019-12-01: qty 1

## 2019-12-01 MED ORDER — ALBUMIN HUMAN 5 % IV SOLN: INTRAVENOUS | Status: DC | PRN

## 2019-12-01 MED ORDER — ACETAMINOPHEN 325 MG PO TABS
650.0000 mg | ORAL_TABLET | ORAL | Status: DC | PRN
  Administered 2019-12-01 – 2019-12-03 (×3): 650 mg via ORAL
  Filled 2019-12-01 (×3): qty 2

## 2019-12-01 MED ORDER — DIPHENHYDRAMINE HCL 25 MG PO CAPS: 25.0000 mg | ORAL_CAPSULE | Freq: Four times a day (QID) | ORAL | Status: DC | PRN

## 2019-12-01 MED ORDER — FENTANYL CITRATE (PF) 100 MCG/2ML IJ SOLN
INTRAMUSCULAR | Status: DC | PRN
  Administered 2019-12-01: 15 ug via INTRATHECAL

## 2019-12-01 MED ORDER — MENTHOL 3 MG MT LOZG: 1.0000 | LOZENGE | OROMUCOSAL | Status: DC | PRN

## 2019-12-01 MED ORDER — FENTANYL-BUPIVACAINE-NACL 0.5-0.125-0.9 MG/250ML-% EP SOLN
12.0000 mL/h | EPIDURAL | Status: DC | PRN
  Filled 2019-12-01: qty 250

## 2019-12-01 MED ORDER — LACTATED RINGERS IV SOLN: INTRAVENOUS | Status: DC | PRN

## 2019-12-01 MED ORDER — LACTATED RINGERS IV SOLN: INTRAVENOUS | Status: DC

## 2019-12-01 MED ORDER — ALBUMIN HUMAN 5 % IV SOLN
INTRAVENOUS | Status: AC
  Filled 2019-12-01: qty 250

## 2019-12-01 MED ORDER — PHENYLEPHRINE HCL (PRESSORS) 10 MG/ML IV SOLN
INTRAVENOUS | Status: DC | PRN
  Administered 2019-12-01: 160 ug via INTRAVENOUS
  Administered 2019-12-01 (×3): 80 ug via INTRAVENOUS
  Administered 2019-12-01 (×2): 120 ug via INTRAVENOUS
  Administered 2019-12-01: 160 ug via INTRAVENOUS

## 2019-12-01 MED ORDER — SIMETHICONE 80 MG PO CHEW: 80.0000 mg | CHEWABLE_TABLET | ORAL | Status: DC | PRN

## 2019-12-01 MED ORDER — PHENYLEPHRINE 40 MCG/ML (10ML) SYRINGE FOR IV PUSH (FOR BLOOD PRESSURE SUPPORT)
80.0000 ug | PREFILLED_SYRINGE | INTRAVENOUS | Status: DC | PRN
  Administered 2019-12-01: 19:00:00 80 ug via INTRAVENOUS

## 2019-12-01 MED ORDER — OXYCODONE HCL 5 MG PO TABS
5.0000 mg | ORAL_TABLET | ORAL | Status: DC | PRN
  Administered 2019-12-02 – 2019-12-03 (×2): 5 mg via ORAL
  Filled 2019-12-01 (×2): qty 1

## 2019-12-01 MED ORDER — SIMETHICONE 80 MG PO CHEW
80.0000 mg | CHEWABLE_TABLET | ORAL | Status: DC
  Administered 2019-12-01 – 2019-12-02 (×2): 80 mg via ORAL
  Filled 2019-12-01 (×2): qty 1

## 2019-12-01 MED ORDER — KETOROLAC TROMETHAMINE 30 MG/ML IJ SOLN
30.0000 mg | Freq: Once | INTRAMUSCULAR | Status: AC
  Administered 2019-12-01: 30 mg via INTRAVENOUS

## 2019-12-01 MED ORDER — OXYTOCIN BOLUS FROM INFUSION: 500.0000 mL | Freq: Once | INTRAVENOUS | Status: DC

## 2019-12-01 MED ORDER — SENNOSIDES-DOCUSATE SODIUM 8.6-50 MG PO TABS
2.0000 | ORAL_TABLET | ORAL | Status: DC
  Administered 2019-12-01 – 2019-12-02 (×2): 2 via ORAL
  Filled 2019-12-01 (×2): qty 2

## 2019-12-01 MED ORDER — METHYLPREDNISOLONE SODIUM SUCC 125 MG IJ SOLR: 125.0000 mg | Freq: Once | INTRAMUSCULAR | Status: DC

## 2019-12-01 MED ORDER — DEXAMETHASONE SODIUM PHOSPHATE 4 MG/ML IJ SOLN
INTRAMUSCULAR | Status: AC
  Filled 2019-12-01: qty 1

## 2019-12-01 MED ORDER — SODIUM CHLORIDE 0.9 % IV SOLN: INTRAVENOUS | Status: DC | PRN

## 2019-12-01 MED ORDER — OXYTOCIN 40 UNITS IN NORMAL SALINE INFUSION - SIMPLE MED
INTRAVENOUS | Status: AC
  Filled 2019-12-01: qty 1000

## 2019-12-01 MED ORDER — PROMETHAZINE HCL 25 MG/ML IJ SOLN: 6.2500 mg | INTRAMUSCULAR | Status: DC | PRN

## 2019-12-01 MED ORDER — ONDANSETRON HCL 4 MG/2ML IJ SOLN: 4.0000 mg | Freq: Four times a day (QID) | INTRAMUSCULAR | Status: DC | PRN

## 2019-12-01 MED ORDER — OXYTOCIN 40 UNITS IN NORMAL SALINE INFUSION - SIMPLE MED
INTRAVENOUS | Status: DC | PRN
  Administered 2019-12-01: 40 [IU] via INTRAVENOUS

## 2019-12-01 MED ORDER — FENTANYL CITRATE (PF) 100 MCG/2ML IJ SOLN
INTRAMUSCULAR | Status: AC
  Filled 2019-12-01: qty 2

## 2019-12-01 MED ORDER — SOD CITRATE-CITRIC ACID 500-334 MG/5ML PO SOLN
30.0000 mL | ORAL | Status: DC | PRN
  Filled 2019-12-01: qty 30

## 2019-12-01 MED ORDER — EPHEDRINE 5 MG/ML INJ: 10.0000 mg | INTRAVENOUS | Status: DC | PRN

## 2019-12-01 MED ORDER — FENTANYL CITRATE (PF) 100 MCG/2ML IJ SOLN
INTRAMUSCULAR | Status: DC | PRN
  Administered 2019-12-01: 100 ug via EPIDURAL

## 2019-12-01 MED ORDER — ONDANSETRON HCL 4 MG/2ML IJ SOLN
INTRAMUSCULAR | Status: AC
  Filled 2019-12-01: qty 2

## 2019-12-01 MED ORDER — SODIUM CHLORIDE (PF) 0.9 % IJ SOLN
INTRAMUSCULAR | Status: DC | PRN
  Administered 2019-12-01: 10 mL/h via EPIDURAL

## 2019-12-01 MED ORDER — MISOPROSTOL 25 MCG QUARTER TABLET
25.0000 ug | ORAL_TABLET | ORAL | Status: DC | PRN
  Administered 2019-12-01 (×3): 25 ug via VAGINAL
  Filled 2019-12-01 (×3): qty 1

## 2019-12-01 MED ORDER — TETANUS-DIPHTH-ACELL PERTUSSIS 5-2.5-18.5 LF-MCG/0.5 IM SUSP: 0.5000 mL | Freq: Once | INTRAMUSCULAR | Status: DC

## 2019-12-01 MED ORDER — KETOROLAC TROMETHAMINE 30 MG/ML IJ SOLN
INTRAMUSCULAR | Status: AC
  Filled 2019-12-01: qty 1

## 2019-12-01 MED ORDER — LIDOCAINE-EPINEPHRINE (PF) 2 %-1:200000 IJ SOLN
INTRAMUSCULAR | Status: AC
  Filled 2019-12-01: qty 10

## 2019-12-01 MED ORDER — COCONUT OIL OIL
1.0000 "application " | TOPICAL_OIL | Status: DC | PRN
  Administered 2019-12-03: 1 via TOPICAL

## 2019-12-01 MED ORDER — LIDOCAINE HCL (PF) 1 % IJ SOLN: 30.0000 mL | INTRAMUSCULAR | Status: DC | PRN

## 2019-12-01 MED ORDER — OXYCODONE-ACETAMINOPHEN 5-325 MG PO TABS: 2.0000 | ORAL_TABLET | ORAL | Status: DC | PRN

## 2019-12-01 MED ORDER — TERBUTALINE SULFATE 1 MG/ML IJ SOLN
0.2500 mg | Freq: Once | INTRAMUSCULAR | Status: AC | PRN
  Administered 2019-12-01: 0.25 mg via SUBCUTANEOUS

## 2019-12-01 MED ORDER — TERBUTALINE SULFATE 1 MG/ML IJ SOLN: 0.2500 mg | Freq: Once | INTRAMUSCULAR | Status: DC | PRN

## 2019-12-01 MED ORDER — DIBUCAINE (PERIANAL) 1 % EX OINT: 1.0000 "application " | TOPICAL_OINTMENT | CUTANEOUS | Status: DC | PRN

## 2019-12-01 MED ORDER — FLEET ENEMA 7-19 GM/118ML RE ENEM: 1.0000 | ENEMA | RECTAL | Status: DC | PRN

## 2019-12-01 MED ORDER — WITCH HAZEL-GLYCERIN EX PADS: 1.0000 "application " | MEDICATED_PAD | CUTANEOUS | Status: DC | PRN

## 2019-12-01 MED ORDER — PRENATAL MULTIVITAMIN CH
1.0000 | ORAL_TABLET | Freq: Every day | ORAL | Status: DC
  Administered 2019-12-02 – 2019-12-03 (×2): 1 via ORAL
  Filled 2019-12-01 (×2): qty 1

## 2019-12-01 MED ORDER — ZOLPIDEM TARTRATE 5 MG PO TABS: 5.0000 mg | ORAL_TABLET | Freq: Every evening | ORAL | Status: DC | PRN

## 2019-12-01 MED ORDER — PHENYLEPHRINE 40 MCG/ML (10ML) SYRINGE FOR IV PUSH (FOR BLOOD PRESSURE SUPPORT)
80.0000 ug | PREFILLED_SYRINGE | INTRAVENOUS | Status: DC | PRN
  Filled 2019-12-01: qty 10

## 2019-12-01 MED ORDER — FENTANYL CITRATE (PF) 100 MCG/2ML IJ SOLN
50.0000 ug | INTRAMUSCULAR | Status: DC | PRN
  Filled 2019-12-01: qty 2

## 2019-12-01 MED ORDER — CEFAZOLIN SODIUM-DEXTROSE 2-4 GM/100ML-% IV SOLN
INTRAVENOUS | Status: AC
  Filled 2019-12-01: qty 100

## 2019-12-01 MED ORDER — OXYCODONE-ACETAMINOPHEN 5-325 MG PO TABS: 1.0000 | ORAL_TABLET | ORAL | Status: DC | PRN

## 2019-12-01 MED ORDER — MORPHINE SULFATE (PF) 0.5 MG/ML IJ SOLN
INTRAMUSCULAR | Status: DC | PRN
  Administered 2019-12-01: 3 mg via EPIDURAL

## 2019-12-01 MED ORDER — DEXAMETHASONE SODIUM PHOSPHATE 4 MG/ML IJ SOLN
INTRAMUSCULAR | Status: DC | PRN
  Administered 2019-12-01: 4 mg via INTRAVENOUS

## 2019-12-01 MED ORDER — LACTATED RINGERS IV SOLN: 500.0000 mL | INTRAVENOUS | Status: DC | PRN

## 2019-12-01 MED ORDER — HYDROMORPHONE HCL 1 MG/ML IJ SOLN: 0.2000 mg | INTRAMUSCULAR | Status: DC | PRN

## 2019-12-01 MED ORDER — OXYTOCIN 40 UNITS IN NORMAL SALINE INFUSION - SIMPLE MED: 2.5000 [IU]/h | INTRAVENOUS | Status: AC

## 2019-12-01 MED ORDER — BUPIVACAINE HCL (PF) 0.25 % IJ SOLN
INTRAMUSCULAR | Status: DC | PRN
  Administered 2019-12-01: 1 mL via INTRATHECAL

## 2019-12-01 MED ORDER — OXYTOCIN 40 UNITS IN NORMAL SALINE INFUSION - SIMPLE MED
1.0000 m[IU]/min | INTRAVENOUS | Status: DC
  Administered 2019-12-01: 14:00:00 2 m[IU]/min via INTRAVENOUS

## 2019-12-01 MED ORDER — MORPHINE SULFATE (PF) 0.5 MG/ML IJ SOLN
INTRAMUSCULAR | Status: AC
  Filled 2019-12-01: qty 10

## 2019-12-01 MED ORDER — OXYTOCIN 40 UNITS IN NORMAL SALINE INFUSION - SIMPLE MED
2.5000 [IU]/h | INTRAVENOUS | Status: DC
  Filled 2019-12-01: qty 1000

## 2019-12-01 MED ORDER — LACTATED RINGERS IV SOLN: 500.0000 mL | Freq: Once | INTRAVENOUS | Status: DC

## 2019-12-01 SURGICAL SUPPLY — 36 items
BENZOIN TINCTURE PRP APPL 2/3 (GAUZE/BANDAGES/DRESSINGS) ×1 IMPLANT
CHLORAPREP W/TINT 26ML (MISCELLANEOUS) ×2 IMPLANT
CLAMP CORD UMBIL (MISCELLANEOUS) IMPLANT
CLOTH BEACON ORANGE TIMEOUT ST (SAFETY) ×2 IMPLANT
DERMABOND ADVANCED (GAUZE/BANDAGES/DRESSINGS)
DERMABOND ADVANCED .7 DNX12 (GAUZE/BANDAGES/DRESSINGS) IMPLANT
DRSG OPSITE POSTOP 4X10 (GAUZE/BANDAGES/DRESSINGS) ×2 IMPLANT
ELECT REM PT RETURN 9FT ADLT (ELECTROSURGICAL) ×2
ELECTRODE REM PT RTRN 9FT ADLT (ELECTROSURGICAL) ×1 IMPLANT
EXTRACTOR VACUUM M CUP 4 TUBE (SUCTIONS) IMPLANT
GLOVE BIO SURGEON STRL SZ7.5 (GLOVE) ×2 IMPLANT
GLOVE BIOGEL PI IND STRL 7.0 (GLOVE) ×1 IMPLANT
GLOVE BIOGEL PI INDICATOR 7.0 (GLOVE) ×1
GOWN STRL REUS W/TWL LRG LVL3 (GOWN DISPOSABLE) ×4 IMPLANT
KIT ABG SYR 3ML LUER SLIP (SYRINGE) ×2 IMPLANT
NDL HYPO 25X5/8 SAFETYGLIDE (NEEDLE) ×1 IMPLANT
NEEDLE HYPO 25X5/8 SAFETYGLIDE (NEEDLE) ×2 IMPLANT
NS IRRIG 1000ML POUR BTL (IV SOLUTION) ×2 IMPLANT
PACK C SECTION WH (CUSTOM PROCEDURE TRAY) ×2 IMPLANT
PAD ABD 7.5X8 STRL (GAUZE/BANDAGES/DRESSINGS) ×1 IMPLANT
PAD OB MATERNITY 4.3X12.25 (PERSONAL CARE ITEMS) ×2 IMPLANT
PENCIL SMOKE EVAC W/HOLSTER (ELECTROSURGICAL) ×2 IMPLANT
SPONGE GAUZE 4X4 12PLY STER LF (GAUZE/BANDAGES/DRESSINGS) ×1 IMPLANT
STRIP CLOSURE SKIN 1/2X4 (GAUZE/BANDAGES/DRESSINGS) ×1 IMPLANT
SUT CHROMIC 2 0 SH (SUTURE) ×1 IMPLANT
SUT MNCRL 0 VIOLET CTX 36 (SUTURE) ×4 IMPLANT
SUT MONOCRYL 0 CTX 36 (SUTURE) ×4
SUT PDS AB 0 CTX 60 (SUTURE) ×3 IMPLANT
SUT PLAIN 0 NONE (SUTURE) IMPLANT
SUT PLAIN 2 0 (SUTURE)
SUT PLAIN 2 0 XLH (SUTURE) IMPLANT
SUT PLAIN ABS 2-0 CT1 27XMFL (SUTURE) IMPLANT
SUT VIC AB 4-0 KS 27 (SUTURE) ×2 IMPLANT
TOWEL OR 17X24 6PK STRL BLUE (TOWEL DISPOSABLE) ×2 IMPLANT
TRAY FOLEY W/BAG SLVR 14FR LF (SET/KITS/TRAYS/PACK) ×2 IMPLANT
WATER STERILE IRR 1000ML POUR (IV SOLUTION) ×3 IMPLANT

## 2019-12-01 NOTE — Progress Notes (Signed)
SROM clear Epidural placed>FHT 60-70s>IV fluids, phenylephrine, knee/chest>FHT 120s FSE placed Cx 4/C/0

## 2019-12-01 NOTE — Progress Notes (Signed)
FHT cat one UCs irregular Cx 1/50/-3/vtx/posterior/soft Cytotec #3 placed Pitocin in 4 hours as needed D/W patient   Results for orders placed or performed during the hospital encounter of 12/01/19 (from the past 24 hour(s))  CBC     Status: Abnormal   Collection Time: 12/01/19 12:56 AM  Result Value Ref Range   WBC 10.1 4.0 - 10.5 K/uL   RBC 3.77 (L) 3.87 - 5.11 MIL/uL   Hemoglobin 11.7 (L) 12.0 - 15.0 g/dL   HCT 35.8 (L) 36.0 - 46.0 %   MCV 95.0 80.0 - 100.0 fL   MCH 31.0 26.0 - 34.0 pg   MCHC 32.7 30.0 - 36.0 g/dL   RDW 13.2 11.5 - 15.5 %   Platelets 120 (L) 150 - 400 K/uL   nRBC 0.0 0.0 - 0.2 %  Platelet by Citrate     Status: None   Collection Time: 12/01/19 12:56 AM  Result Value Ref Range   Platelet CT in Citrate 113   Comprehensive metabolic panel     Status: Abnormal   Collection Time: 12/01/19 12:56 AM  Result Value Ref Range   Sodium 138 135 - 145 mmol/L   Potassium 4.7 3.5 - 5.1 mmol/L   Chloride 105 98 - 111 mmol/L   CO2 22 22 - 32 mmol/L   Glucose, Bld 102 (H) 70 - 99 mg/dL   BUN 8 6 - 20 mg/dL   Creatinine, Ser 0.61 0.44 - 1.00 mg/dL   Calcium 9.5 8.9 - 10.3 mg/dL   Total Protein 6.4 (L) 6.5 - 8.1 g/dL   Albumin 2.9 (L) 3.5 - 5.0 g/dL   AST 19 15 - 41 U/L   ALT 18 0 - 44 U/L   Alkaline Phosphatase 159 (H) 38 - 126 U/L   Total Bilirubin 0.4 0.3 - 1.2 mg/dL   GFR calc non Af Amer >60 >60 mL/min   GFR calc Af Amer >60 >60 mL/min   Anion gap 11 5 - 15  Type and screen     Status: None   Collection Time: 12/01/19  1:17 AM  Result Value Ref Range   ABO/RH(D) O POS    Antibody Screen NEG    Sample Expiration      12/04/2019,2359 Performed at [facility], [address], [place], [place] [address]   ABO/Rh     Status: None   Collection Time: 12/01/19  1:17 AM  Result Value Ref Range   ABO/RH(D)      O POS Performed at [facility] [address], [place], [place] [address]

## 2019-12-01 NOTE — Transfer of Care (Signed)
Immediate Anesthesia Transfer of Care Note  Patient: Tina Wilson  Procedure(s) Performed: CESAREAN SECTION (N/A )  Patient Location: PACU  Anesthesia Type:Epidural  Level of Consciousness: awake, alert  and patient cooperative  Airway & Oxygen Therapy: Patient Spontanous Breathing  Post-op Assessment: Report given to RN and Post -op Vital signs reviewed and stable  Post vital signs: Reviewed and stable  Last Vitals:  Vitals Value Taken Time  BP 100/48 12/01/19 1945  Temp    Pulse 119 12/01/19 1949  Resp 17 12/01/19 1949  SpO2 96 % 12/01/19 1949  Vitals shown include unvalidated device data.  Last Pain:  Vitals:   12/01/19 1750  TempSrc:   PainSc: 10-Worst pain ever         Complications: No apparent anesthesia complications

## 2019-12-01 NOTE — Anesthesia Procedure Notes (Signed)
Epidural Patient location during procedure: OB Start time: 12/01/2019 6:08 PM End time: 12/01/2019 6:11 PM  Staffing Anesthesiologist: Brennan Bailey, MD Performed: anesthesiologist   Preanesthetic Checklist Completed: patient identified, IV checked, risks and benefits discussed, monitors and equipment checked, pre-op evaluation and timeout performed  Epidural Patient position: sitting Prep: DuraPrep and site prepped and draped Patient monitoring: heart rate, continuous pulse ox and blood pressure Approach: midline Location: L3-L4 Injection technique: LOR air  Needle:  Needle type: Tuohy  Needle gauge: 17 G Needle length: 9 cm Needle insertion depth: 5 cm Catheter type: closed end flexible Catheter size: 19 Gauge Catheter at skin depth: 10 cm  Assessment Events: blood not aspirated, injection not painful, no injection resistance, no paresthesia and negative IV test  Additional Notes Patient identified. Risks, benefits, and alternatives discussed with patient including but not limited to bleeding, infection, nerve damage, paralysis, failed block, incomplete pain control, headache, blood pressure changes, nausea, vomiting, reactions to medication, itching, and postpartum back pain. Confirmed with bedside nurse the patient's most recent platelet count. Confirmed with patient that they are not currently taking any anticoagulation, have any bleeding history, or any family history of bleeding disorders. Patient expressed understanding and wished to proceed. All questions were answered. Sterile technique was used throughout the entire procedure. Please see nursing notes for vital signs.   Crisp LOR with Tuohy needle on first pass. [place] 25g 11mm spinal needle introduced through Tuohy with clear CSF return prior to injection of intrathecal medication. Spinal needle withdrawn and epidural catheter threaded easily. Negative aspiration of catheter for heme or CSF prior to starting  epidural infusion. Warning signs of high block given to the patient including shortness of breath, tingling/numbness in hands, complete motor block, or any concerning symptoms with instructions to call for help. Patient was given instructions on fall risk and not to get out of bed. All questions and concerns addressed with instructions to call with any issues or inadequate analgesia.  Patient comfortable with contractions prior to my leaving room. Reason for block:procedure for pain

## 2019-12-01 NOTE — Progress Notes (Signed)
FHT cat one UCs q2-4 min Cx 1/70/-2/very soft/not as posterior Begin pitocin-D/W patient

## 2019-12-01 NOTE — Anesthesia Preprocedure Evaluation (Addendum)
Anesthesia Evaluation  Patient identified by MRN, date of birth, ID band Patient awake    Reviewed: Allergy & Precautions, NPO status , Patient's Chart, lab work & pertinent test results  History of Anesthesia Complications Negative for: history of anesthetic complications  Airway Mallampati: II  TM Distance: >3 FB Neck ROM: Full    Dental no notable dental hx.    Pulmonary asthma ,    Pulmonary exam normal        Cardiovascular negative cardio ROS Normal cardiovascular exam     Neuro/Psych  Headaches, Anxiety Depression    GI/Hepatic negative GI ROS, Neg liver ROS,   Endo/Other  negative endocrine ROS  Renal/GU negative Renal ROS  negative genitourinary   Musculoskeletal negative musculoskeletal ROS (+)   Abdominal   Peds  Hematology negative hematology ROS (+)   Anesthesia Other Findings Day of surgery medications reviewed with patient.  Reproductive/Obstetrics (+) Pregnancy Gestational thrombocytopenia, plt 114k                             Anesthesia Physical Anesthesia Plan  ASA: II and emergent  Anesthesia Plan: Combined Spinal and Epidural   Post-op Pain Management:    Induction:   PONV Risk Score and Plan: 4 or greater and Treatment may vary due to age or medical condition, Ondansetron and Dexamethasone  Airway Management Planned: Natural Airway  Additional Equipment:   Intra-op Plan:   Post-operative Plan:   Informed Consent: I have reviewed the patients History and Physical, chart, labs and discussed the procedure including the risks, benefits and alternatives for the proposed anesthesia with the patient or authorized representative who has indicated his/her understanding and acceptance.       Plan Discussed with:   Anesthesia Plan Comments: (Epidural used for stat C/S (NRFHT). Daiva Huge, MD)        Anesthesia Quick Evaluation

## 2019-12-01 NOTE — Brief Op Note (Signed)
12/01/2019  7:29 PM  PATIENT:  Tina Wilson  34 y.o. female  PRE-OPERATIVE DIAGNOSIS:  Emergency unscheduled primary cesarean section; fetal intolerance to labor  POST-OPERATIVE DIAGNOSIS:  Emergency unscheduled primary cesarean section; fetal intolerance to labor  PROCEDURE:  Procedure(s): CESAREAN SECTION (N/A)  SURGEON:  Surgeon(s) and Role:    * Everlene Farrier, MD - Primary  PHYSICIAN ASSISTANT:   ASSISTANTS: Dr Dione Plover   ANESTHESIA:   epidural  EBL:  Per anesthesiology record  BLOOD ADMINISTERED:none  DRAINS: Urinary Catheter (Foley)   LOCAL MEDICATIONS USED:  NONE  SPECIMEN:  Source of Specimen:  placenta  DISPOSITION OF SPECIMEN:  PATHOLOGY  COUNTS:  YES  TOURNIQUET:  * No tourniquets in log *  DICTATION: .Other Dictation: Dictation Number [id number]  PLAN OF CARE: Admit to inpatient   PATIENT DISPOSITION:  PACU - hemodynamically stable.   Delay start of Pharmacological VTE agent (>24hrs) due to surgical blood loss or risk of bleeding: not applicable

## 2019-12-01 NOTE — Anesthesia Postprocedure Evaluation (Signed)
Anesthesia Post Note  Patient: Tina Wilson  Procedure(s) Performed: CESAREAN SECTION (N/A )     Patient location during evaluation: PACU Anesthesia Type: Epidural Level of consciousness: awake and alert and oriented Pain management: pain level controlled Vital Signs Assessment: post-procedure vital signs reviewed and stable Respiratory status: spontaneous breathing, nonlabored ventilation and respiratory function stable Cardiovascular status: blood pressure returned to baseline Postop Assessment: epidural receding, no apparent nausea or vomiting, no headache and no backache Anesthetic complications: no    Last Vitals:  Vitals:   12/01/19 2030 12/01/19 2045  BP: 119/73 124/77  Pulse: (!) 106   Resp: 20 17  Temp:    SpO2: 99%     Last Pain:  Vitals:   12/01/19 2045  TempSrc:   PainSc: 3    Pain Goal:    LLE Motor Response: Purposeful movement (12/01/19 2045)   RLE Motor Response: Purposeful movement (12/01/19 2045)       Epidural/Spinal Function Cutaneous sensation: Tingles (12/01/19 2045), Patient able to flex knees: Yes (12/01/19 2045), Patient able to lift hips off bed: Yes (12/01/19 2045), Back pain beyond tenderness at insertion site: No (12/01/19 2045), Progressively worsening motor and/or sensory loss: No (12/01/19 2045), Bowel and/or bladder incontinence post epidural: No (12/01/19 2045)  Brennan Bailey

## 2019-12-02 ENCOUNTER — Encounter (HOSPITAL_COMMUNITY): Payer: Self-pay | Admitting: Obstetrics and Gynecology

## 2019-12-02 LAB — CBC
HCT: 29.1 % — ABNORMAL LOW (ref 36.0–46.0)
Hemoglobin: 9.5 g/dL — ABNORMAL LOW (ref 12.0–15.0)
MCH: 30.8 pg (ref 26.0–34.0)
MCHC: 32.6 g/dL (ref 30.0–36.0)
MCV: 94.5 fL (ref 80.0–100.0)
Platelets: 125 10*3/uL — ABNORMAL LOW (ref 150–400)
RBC: 3.08 MIL/uL — ABNORMAL LOW (ref 3.87–5.11)
RDW: 13.1 % (ref 11.5–15.5)
WBC: 19 10*3/uL — ABNORMAL HIGH (ref 4.0–10.5)
nRBC: 0 % (ref 0.0–0.2)

## 2019-12-02 MED ORDER — DIPHENHYDRAMINE HCL 25 MG PO CAPS: 25.0000 mg | ORAL_CAPSULE | ORAL | Status: DC | PRN

## 2019-12-02 MED ORDER — NALBUPHINE HCL 10 MG/ML IJ SOLN: 5.0000 mg | Freq: Once | INTRAMUSCULAR | Status: DC | PRN

## 2019-12-02 MED ORDER — SODIUM CHLORIDE 0.9% FLUSH: 3.0000 mL | INTRAVENOUS | Status: DC | PRN

## 2019-12-02 MED ORDER — NALOXONE HCL 4 MG/10ML IJ SOLN
1.0000 ug/kg/h | INTRAVENOUS | Status: DC | PRN
  Filled 2019-12-02: qty 5

## 2019-12-02 MED ORDER — NALBUPHINE HCL 10 MG/ML IJ SOLN: 5.0000 mg | INTRAMUSCULAR | Status: DC | PRN

## 2019-12-02 MED ORDER — KETOROLAC TROMETHAMINE 30 MG/ML IJ SOLN: 30.0000 mg | Freq: Four times a day (QID) | INTRAMUSCULAR | Status: AC | PRN

## 2019-12-02 MED ORDER — IBUPROFEN 800 MG PO TABS
800.0000 mg | ORAL_TABLET | Freq: Three times a day (TID) | ORAL | Status: DC
  Administered 2019-12-02 – 2019-12-03 (×4): 800 mg via ORAL
  Filled 2019-12-02 (×4): qty 1

## 2019-12-02 MED ORDER — DIPHENHYDRAMINE HCL 50 MG/ML IJ SOLN: 12.5000 mg | INTRAMUSCULAR | Status: DC | PRN

## 2019-12-02 MED ORDER — ACETAMINOPHEN 500 MG PO TABS
1000.0000 mg | ORAL_TABLET | Freq: Four times a day (QID) | ORAL | Status: AC
  Administered 2019-12-02 (×3): 1000 mg via ORAL
  Filled 2019-12-02 (×3): qty 2

## 2019-12-02 MED ORDER — NALOXONE HCL 0.4 MG/ML IJ SOLN: 0.4000 mg | INTRAMUSCULAR | Status: DC | PRN

## 2019-12-02 MED ORDER — ONDANSETRON HCL 4 MG/2ML IJ SOLN
4.0000 mg | Freq: Three times a day (TID) | INTRAMUSCULAR | Status: DC | PRN
  Administered 2019-12-02: 4 mg via INTRAVENOUS
  Filled 2019-12-02: qty 2

## 2019-12-02 NOTE — Progress Notes (Signed)
Attempted to get pt up for orthostatic VS/ambulation. Pt reports not feeling well, and intermittent nausea/ weakness. LR is infusing. Pt VS WDL, but on the lower end. Will continue to monitor.

## 2019-12-02 NOTE — Lactation Note (Signed)
This note was copied from a baby's chart. Lactation Consultation Note  Patient Name: Tina Wilson [id number] Date: 12/02/2019 Reason for consult: Initial assessment;Term P2, 8 hour term female infant. Per mom, she has medela DEBP at home. Mom is experienced with breastfeeding, she breastfed her daughter for 64 months, she saw LC in outpatient clinic due 1st child having latch issues that was eventually resolved. Mom feels infant is latching well.  LC change one void and one stool while in room. Per mom, she has medela DEBP at home. This is mom's fourth time latching infant at breast. Mom latched infant on left breast using the football hold, infant latch wide mouth, nose and chin touching breast, swallows observed, infant breastfed for 15 minutes. Mom taught back hand expression and infant was given 3 mls of colostrum by spoon. Mom knows to breastfeed according hunger cues, 8 to 12 times within 24 hours, on demand. Parents will continue to do STS, as much as possible, dad was doing STS with infant when [facility] left the room. LC discussed community resources for breastfeeding once discharge from hospital: [place] hot line, [place] out patient clinic and online [place] support group.   Maternal Data Formula Feeding for Exclusion: No Has patient been taught Hand Expression?: Yes Does the patient have breastfeeding experience prior to this delivery?: Yes  Feeding Feeding Type: Breast Fed  LATCH Score Latch: Grasps breast easily, tongue down, lips flanged, rhythmical sucking.  Audible Swallowing: Spontaneous and intermittent  Type of Nipple: Everted at rest and after stimulation  Comfort (Breast/Nipple): Soft / non-tender  Hold (Positioning): Assistance needed to correctly position infant at breast and maintain latch.  LATCH Score: 9  Interventions Interventions: Breast feeding basics reviewed;Breast compression;Assisted with latch;Adjust position;Skin to skin;Support pillows;Breast  massage;Position options;Hand express;Expressed milk  Lactation Tools Discussed/Used WIC Program: No   Consult Status Consult Status: Follow-up Date: 12/02/19 Follow-up type: In-patient    Vicente Serene 12/02/2019, 3:22 AM

## 2019-12-02 NOTE — Progress Notes (Signed)
Subjective: Postpartum Day 1: Cesarean Delivery Patient reports tolerating PO.  Patient desires circ but no peds check yet.  Objective: Vital signs in last 24 hours: Temp:  [97.5 F (36.4 C)-98.6 F (37 C)] 98.4 F (36.9 C) (03/18 0749) Pulse Rate:  [60-120] 77 (03/18 0749) Resp:  [15-24] 17 (03/18 0749) BP: (94-133)/(47-86) 99/68 (03/18 0749) SpO2:  [97 %-100 %] 98 % (03/18 0749)  Physical Exam:  General: alert, cooperative and appears stated age Lochia: appropriate Uterine Fundus: firm Incision: healing well, no significant drainage, no dehiscence.  Pressure dressing removed; scant blood stain on honeycomb DVT Evaluation: No evidence of DVT seen on physical exam. Negative Homan's sign. No cords or calf tenderness.  Recent Labs    12/01/19 2018 12/02/19 0615  HGB 10.1* 9.5*  HCT 31.2* 29.1*  PLT 109->125k  Assessment/Plan: Status post Cesarean section. Doing well postoperatively.  Continue current care. Plan inpatient circ prior to d/c.  Linda Hedges 12/02/2019, 8:49 AM

## 2019-12-02 NOTE — Op Note (Signed)
NAME: Tina, Wilson MEDICAL RECORD [id number] ACCOUNT [id number] DATE OF BIRTH:Jul 19, 1986 FACILITY: [facility] LOCATION: [place], [place]  OPERATIVE REPORT  DATE OF PROCEDURE:  12/01/2019  PREOPERATIVE DIAGNOSIS:  Fetal intolerance of labor.  POSTOPERATIVE DIAGNOSIS:  Fetal intolerance of labor.  PROCEDURE:  Emergent low transverse cesarean section.  SURGEON:  Everlene Farrier, MD   ASSISTANT:  Criss Rosales, MD   FINDINGS:  Viable female infant.  Apgars, arterial cord pH, birth weight pending.  SPECIMENS:  Placenta to pathology.  INDICATIONS AND CONSENT:  This patient is a 34 year old married white female G2 P1 at 39-2/7 weeks who was admitted for 2-stage induction of labor.  Pregnancy has been complicated by gestational thrombocytopenia.  She had spontaneous rupture of membranes  for clear fluid.  Epidural was placed and shortly thereafter baby had a bradycardic episode to the 60s to the 70s for about 6-7 minutes.  This was treated with IV fluids, phenylephrine, and terbutaline, and position change to knee-chest.  Fetal heart  rate recovered to the 120s for perhaps 4-5 minutes and then again went to the 60s-70s.  Two cervical exam noted the cervix to be 4 cm, complete effacement 0 station, but not progressing.  She was given a 2nd dose of phenylephrine as well as additional IV  fluids and again knee-chest position, which fails to resuscitate fetal heartbeat.  Therefore, the recommendation for emergent cesarean section was made.  DESCRIPTION OF PROCEDURE:  The patient was taken emergently to the operating room where her epidural anesthetic is being augmented to a surgical level.  Auscultation reveals a heart rate in the 120s.  Foley catheter was placed and she was prepped  abdominally with Betadine.  Timeout was undertaken.  She was draped in a sterile fashion and after establishing adequate epidural anesthesia, skin was entered through a Pfannenstiel  incision and dissection was carried out in layers to the peritoneum  which was entered bluntly and taken down bluntly.  Vesicouterine peritoneum was taken down cephalolaterally.  Bladder flap developed and the bladder blade was placed.  Uterus was incised in a low transverse manner and the uterine cavity was entered  bluntly with a hemostat.  Uterine incision was extended bilaterally with fingers.  Vertex was delivered and the baby was delivered without difficulty.  Good cry and tone is noted.  After 1 minute, cord was clamped and cut and the baby was handed to  waiting pediatrics team.  Placenta is manually delivered.  The uterus was exteriorized and careful inspection revealed the cavity to be clean.  Uterus was closed in 2 running locking imbricating layers of 0 Monocryl suture.  Additional 2-0 chromic was  used to obtain complete hemostasis.  Tubes and ovaries are normal.  Uterus was returned to the abdomen and lavage was carried out.  Careful inspection reveals excellent hemostasis, all around.  Anterior peritoneum was closed in a running fashion with a 0  Monocryl suture, which was also used to reapproximate the pyramidalis muscle in the midline.  Lavage was again carried out and hemostasis was obtained with a 2-0 chromic and brief cautery.  Excellent hemostasis was again noted.  The anterior rectus  fascia was closed in a running fashion with a 0 looped PDS.  Subcutaneous layer was closed with interrupted plain and the skin was closed in a subcuticular fashion with 4-0 Vicryl on a Keith needle.  Benzoin, Steri-Strips, honeycomb dressing and pressure  dressing are applied.  All counts were correct and the patient was  taken to recovery room in stable condition.  CN/NUANCE  D:12/01/2019 T:12/02/2019 JOB:[id number]

## 2019-12-02 NOTE — Lactation Note (Signed)
This note was copied from a baby's chart. Lactation Consultation Note  Patient Name: Tina Wilson [id number] Date: 12/02/2019 Reason for consult: Follow-up assessment   LC Follow Up Visit:  Attempted to visit with mother, however, she is asleep.  Spoke with RN who mentioned that baby latched and fed well at the last feeding; will attempt to return later today.    Consult Status Consult Status: Follow-up Date: 12/02/19 Follow-up type: In-patient    Amisadai Woodford R Jayra Choyce 12/02/2019, 2:23 PM

## 2019-12-03 MED ORDER — OXYCODONE HCL 5 MG PO TABS: 5.0000 mg | ORAL_TABLET | ORAL | 0 refills | Status: DC | PRN

## 2019-12-03 MED ORDER — IBUPROFEN 600 MG PO TABS: 600.0000 mg | ORAL_TABLET | Freq: Four times a day (QID) | ORAL | 0 refills | Status: DC | PRN

## 2019-12-03 MED ORDER — DOCUSATE SODIUM 100 MG PO CAPS: 100.0000 mg | ORAL_CAPSULE | Freq: Two times a day (BID) | ORAL | 2 refills | Status: DC

## 2019-12-03 NOTE — Progress Notes (Signed)
MOB was referred for history of depression/anxiety.  * Referral screened out by Clinical Social Worker because none of the following criteria appear to apply:  ~ History of anxiety/depression during this pregnancy, or of post-partum depression following prior delivery. ~ Diagnosis of anxiety and/or depression within last 3 years OR * MOB's symptoms currently being treated with medication and/or therapy.  Please contact the Clinical Social Worker if needs arise or by MOB request.  Elijio Miles, LCSW [facility] [place] [phone]

## 2019-12-03 NOTE — Discharge Summary (Signed)
Obstetric Discharge Summary Reason for Admission: PIH w/out severe features Prenatal Procedures: Preeclampsia Intrapartum Procedures: cesarean: low cervical, transverse - nrFHT (prior SVD) Postpartum Procedures: none Complications-Operative and Postpartum: none Hemoglobin  Date Value Ref Range Status  12/02/2019 9.5 (L) 12.0 - 15.0 g/dL Final  11/26/2019 11.6 (L) 12.0 - 15.0 g/dL Final  07/16/2018 12.6 11.1 - 15.9 g/dL Final   HCT  Date Value Ref Range Status  12/02/2019 29.1 (L) 36.0 - 46.0 % Final   Hematocrit  Date Value Ref Range Status  07/16/2018 36.7 34.0 - 46.6 % Final    Physical Exam:  General: alert Lochia: appropriate Uterine Fundus: firm Incision: healing well DVT Evaluation: No evidence of DVT seen on physical exam.  Discharge Diagnoses: Term Pregnancy-delivered and Preelampsia  Discharge Information: Date: 12/03/2019 Activity: pelvic rest Diet: routine Medications: PNV, Ibuprofen, Colace and oxycodone Condition: stable Instructions: refer to practice specific booklet Discharge to: home   Newborn Data: Live born female  Birth Weight: 6 lb 9.5 oz (2990 g) APGAR: 61, 10  Newborn Delivery   Birth date/time: 12/01/2019 18:52:00 Delivery type: C-Section, Low Transverse Trial of labor: Yes C-section categorization: Primary     D/W patient female infant circumcision, risks/benefits reviewed. All questions answered.  Home with mother.  Tyson Dense 12/03/2019, 9:57 AM

## 2019-12-03 NOTE — Lactation Note (Signed)
This note was copied from a baby's chart. Lactation Consultation Note  Patient Name: Tina Wilson [id number] Date: 12/03/2019 Reason for consult: Follow-up assessment;Term;Infant weight loss P2, 30 hour term female infant, -4% weight loss. Infant had 5 stools and 2 voids today. Per mom, infant is latching well and she has no breastfeeding concerns today. Mom was breastfeeding infant on right breast using the football hold position, swallows observed and infant was still breastfeeding after 20 minutes when LC left the room. Mom knows to call RN or [employer] if she has any questions, concerns or need assistance with latching infant at breast. Mom will continue to breastfeed infant on demand, according hunger cues and not exceed 3 hours without breastfeeding infant.   Maternal Data    Feeding Feeding Type: Breast Fed  LATCH Score Latch: Grasps breast easily, tongue down, lips flanged, rhythmical sucking.  Audible Swallowing: Spontaneous and intermittent  Type of Nipple: Everted at rest and after stimulation  Comfort (Breast/Nipple): Soft / non-tender  Hold (Positioning): No assistance needed to correctly position infant at breast.  LATCH Score: 10  Interventions    Lactation Tools Discussed/Used     Consult Status Consult Status: Follow-up Date: 12/03/19 Follow-up type: In-patient    Vicente Serene 12/03/2019, 1:02 AM

## 2019-12-03 NOTE — Lactation Note (Addendum)
This note was copied from a baby's chart. Lactation Consultation Note:  Mother reports that her nipples are slightly tender. She denies any redness or tissue break down. Mother was given comfort gels and a harmony hand pump. Mother advised to continue to hand express .  Discussed treatment and prevention of engorgement.   Plan of Care : Breastfeed infant with feeding cue Mother to continue to cue base feed infant and feed at least 8-12 times or more in 24 hours and advised to allow for cluster feeding infant as needed.   Mother to continue to due STS. Mother is aware of available LC services at [facility], BFSG'S, OP Dept, and phone # for questions or concerns about breastfeeding.  Mother receptive to all teaching and plan of care.    Patient Name: Tina Wilson [id number] Date: 12/03/2019 Reason for consult: Follow-up assessment   Maternal Data    Feeding Feeding Type: Breast Fed  LATCH Score                   Interventions Interventions: Coconut oil;Comfort gels;Hand pump  Lactation Tools Discussed/Used     Consult Status Consult Status: Complete Date: 12/03/19    Jess Barters [facility] 12/03/2019, 2:04 PM

## 2019-12-04 ENCOUNTER — Other Ambulatory Visit (HOSPITAL_COMMUNITY): Payer: 59

## 2019-12-04 LAB — BPAM RBC
Blood Product Expiration Date: 202104212359
Blood Product Expiration Date: 202104212359
ISSUE DATE / TIME: 202103171543
ISSUE DATE / TIME: 202103171543
Unit Type and Rh: 5100
Unit Type and Rh: 5100

## 2019-12-04 LAB — TYPE AND SCREEN
ABO/RH(D): O POS
Antibody Screen: NEGATIVE
Unit division: 0
Unit division: 0

## 2019-12-06 ENCOUNTER — Inpatient Hospital Stay (HOSPITAL_COMMUNITY): Payer: 59

## 2019-12-06 LAB — SURGICAL PATHOLOGY

## 2020-05-31 ENCOUNTER — Other Ambulatory Visit: Payer: 59

## 2020-06-12 ENCOUNTER — Ambulatory Visit
Admission: EM | Admit: 2020-06-12 | Discharge: 2020-06-12 | Disposition: A | Discharge status: Home / Self Care | Payer: 59 | Attending: Physician Assistant | Admitting: Physician Assistant

## 2020-06-12 ENCOUNTER — Other Ambulatory Visit: Payer: Self-pay

## 2020-06-12 DIAGNOSIS — R0981 Nasal congestion: Secondary | ICD-10-CM | POA: Diagnosis present

## 2020-06-12 DIAGNOSIS — J392 Other diseases of pharynx: Secondary | ICD-10-CM | POA: Insufficient documentation

## 2020-06-12 DIAGNOSIS — Z1152 Encounter for screening for COVID-19: Secondary | ICD-10-CM | POA: Insufficient documentation

## 2020-06-12 LAB — POCT RAPID STREP A (OFFICE): Rapid Strep A Screen: NEGATIVE

## 2020-06-12 MED ORDER — AZELASTINE HCL 0.1 % NA SOLN: 2.0000 | Freq: Two times a day (BID) | NASAL | 0 refills | Status: DC

## 2020-06-12 NOTE — Discharge Instructions (Addendum)
Rapid strep negative. COVID PCR testing ordered. I would like you to quarantine until testing results. You can take over the counter flonase/nasacort to help with nasal congestion/drainage. Can add on azelastine if needed. Tylenol/motrin for pain and fever. Keep hydrated, urine should be clear to pale yellow in color. If experiencing shortness of breath, trouble breathing, go to the emergency department for further evaluation needed.   For sore throat/cough try using a honey-based tea. Use 3 teaspoons of honey with juice squeezed from half lemon. Place shaved pieces of ginger into 1/2-1 cup of water and warm over stove top. Then mix the ingredients and repeat every 4 hours as needed.

## 2020-06-12 NOTE — ED Triage Notes (Signed)
Pt c/o sore scratchy and dry throat since yesterday. States now having nasal congestion with sneezing. States has had a positive strep exposure.

## 2020-06-12 NOTE — ED Provider Notes (Signed)
[name]    CSN: [id number] Arrival date & time: 06/12/20  1444      History   Chief Complaint Chief Complaint  Patient presents with   Sore Throat    HPI Tina Wilson is a 34 y.o. female.   34 year old female comes in for 2 day history of sore/scratchy throat. States now with nasal congestion, sneezing. Denies cough. Denies fever, chills, body aches. Denies abdominal pain, nausea, vomiting, diarrhea. Denies shortness of breath, loss of taste/smell. Positive strep contact.      Past Medical History:  Diagnosis Date   Allergy    Anxiety    Asthma    Depression    Herpes genitalis in women 2010   Migraine    Thrombocytopenic disorder [facility])     Patient Active Problem List   Diagnosis Date Noted   Term pregnancy 12/01/2019   Rhinosinusitis 06/01/2019   Thrombocytopenia ([place]) 08/04/2018   Elevated LDL cholesterol level 08/04/2018   Sore throat 07/20/2018   Cough 07/20/2018   Migraine without aura and without status migrainosus, not intractable 06/23/2018   Post-dates pregnancy 10/30/2016   Healthcare maintenance 01/11/2013    Past Surgical History:  Procedure Laterality Date   CESAREAN SECTION N/A 12/01/2019   Procedure: CESAREAN SECTION;  Surgeon: Everlene Farrier, MD;  [facility]: [facility];  Service: Obstetrics;  Laterality: N/A;   NO PAST SURGERIES      OB History    Gravida  2   Para  2   Term  2   Preterm      AB      Living  2     SAB      TAB      Ectopic      Multiple  0   Live Births  2            Home Medications    Prior to Admission medications   Medication Sig Start Date End Date Taking? Authorizing Provider  azelastine (ASTELIN) 0.1 % nasal spray Place 2 sprays into both nostrils 2 (two) times daily. 06/12/20   Ok Edwards, PA-C  Prenatal Vit-Fe Fumarate-FA (PRENATAL MULTIVITAMIN) TABS tablet Take 1 tablet by mouth daily at 12 noon.    [provider]    Family  History Family History  Problem Relation Age of Onset   Alcohol abuse Maternal Grandmother    Breast cancer Paternal Grandmother    Alcohol abuse Paternal Grandfather    Arthritis Mother    Depression Mother    Hyperlipidemia Father    Hypertension Father    Alcohol abuse Paternal Aunt    Heart attack Maternal Grandfather    Stroke Maternal Grandfather    Cancer Neg Hx    COPD Neg Hx    Diabetes Neg Hx    Drug abuse Neg Hx    Early death Neg Hx    Heart disease Neg Hx    Kidney disease Neg Hx     Social History Social History   Tobacco Use   Smoking status: Never Smoker   Smokeless tobacco: Never Used  Scientific laboratory technician Use: Never used  Substance Use Topics   Alcohol use: Yes    Alcohol/week: 14.0 standard drinks    Types: 10 Glasses of wine, 4 Standard drinks or equivalent per week   Drug use: No    Comment: valtrex     Allergies   Patient has no known allergies.   Review  of Systems Review of Systems  Reason unable to perform ROS: See HPI as above.     Physical Exam Triage Vital Signs ED Triage Vitals  Enc Vitals Group     BP 06/12/20 1453 120/80     Pulse Rate 06/12/20 1453 79     Resp 06/12/20 1453 16     Temp 06/12/20 1453 98.7 F (37.1 C)     Temp Source 06/12/20 1453 Oral     SpO2 06/12/20 1453 98 %     Weight --      Height --      Head Circumference --      Peak Flow --      Pain Score 06/12/20 1503 5     Pain Loc --      Pain Edu? --      Excl. in [place]? --    No data found.  Updated Vital Signs BP 120/80 (BP Location: Left Arm)    Pulse 79    Temp 98.7 F (37.1 C) (Oral)    Resp 16    LMP 05/23/2020    SpO2 98%    Breastfeeding Yes   Physical Exam Constitutional:      General: She is not in acute distress.    Appearance: Normal appearance. She is well-developed. She is not ill-appearing, toxic-appearing or diaphoretic.  HENT:     Head: Normocephalic and atraumatic.     Right Ear: Ear canal and external ear  normal. A middle ear effusion is present. Tympanic membrane is not erythematous or bulging.     Left Ear: Ear canal and external ear normal. A middle ear effusion is present. Tympanic membrane is not erythematous or bulging.     Nose:     Right Sinus: No maxillary sinus tenderness or frontal sinus tenderness.     Left Sinus: No maxillary sinus tenderness or frontal sinus tenderness.     Mouth/Throat:     Mouth: Mucous membranes are moist.     Pharynx: Oropharynx is clear. Uvula midline.  Eyes:     Conjunctiva/sclera: Conjunctivae normal.     Pupils: Pupils are equal, round, and reactive to light.  Cardiovascular:     Rate and Rhythm: Normal rate and regular rhythm.  Pulmonary:     Effort: Pulmonary effort is normal. No accessory muscle usage, prolonged expiration, respiratory distress or retractions.     Breath sounds: No decreased air movement or transmitted upper airway sounds. No decreased breath sounds.     Comments: LCTAB Musculoskeletal:     Cervical back: Normal range of motion and neck supple.  Skin:    General: Skin is warm and dry.  Neurological:     Mental Status: She is alert and oriented to person, place, and time.      UC Treatments / Results  Labs (all labs ordered are listed, but only abnormal results are displayed) Labs Reviewed  NOVEL CORONAVIRUS, NAA  CULTURE, GROUP A STREP [facility])  POCT RAPID STREP A (OFFICE)    EKG   Radiology No results found.  Procedures Procedures (including critical care time)  Medications Ordered in UC Medications - No data to display  Initial Impression / Assessment and Plan / UC Course  I have reviewed the triage vital signs and the nursing notes.  Pertinent labs & imaging results that were available during my care of the patient were reviewed by me and considered in my medical decision making (see chart for details).    Rapid strep negative.  COVID PCR test ordered. Patient to quarantine until testing results return. No  alarming signs on exam. LCTAB. Symptomatic treatment discussed.  Push fluids.  Return precautions given.  Patient expresses understanding and agrees to plan.  Final Clinical Impressions(s) / UC Diagnoses   Final diagnoses:  Encounter for screening for COVID-19  Throat irritation  Nasal congestion    ED Prescriptions    Medication Sig Dispense Auth. Provider   azelastine (ASTELIN) 0.1 % nasal spray Place 2 sprays into both nostrils 2 (two) times daily. 30 mL Ok Edwards, PA-C     PDMP not reviewed this encounter.   Ok Edwards, PA-C 06/12/20 1533

## 2020-06-13 LAB — NOVEL CORONAVIRUS, NAA: SARS-CoV-2, NAA: NOT DETECTED

## 2020-06-13 LAB — SARS-COV-2, NAA 2 DAY TAT

## 2020-06-15 LAB — CULTURE, GROUP A STREP (THRC)

## 2020-09-26 ENCOUNTER — Emergency Department (HOSPITAL_COMMUNITY)
Admission: EM | Admit: 2020-09-26 | Discharge: 2020-09-26 | Disposition: A | Discharge status: Home / Self Care | Payer: 59 | Attending: Emergency Medicine | Admitting: Emergency Medicine

## 2020-09-26 ENCOUNTER — Other Ambulatory Visit: Payer: Self-pay

## 2020-09-26 ENCOUNTER — Encounter (HOSPITAL_COMMUNITY): Payer: Self-pay

## 2020-09-26 DIAGNOSIS — R1011 Right upper quadrant pain: Secondary | ICD-10-CM | POA: Diagnosis not present

## 2020-09-26 DIAGNOSIS — Z5321 Procedure and treatment not carried out due to patient leaving prior to being seen by health care provider: Secondary | ICD-10-CM | POA: Diagnosis not present

## 2020-09-26 DIAGNOSIS — M545 Low back pain, unspecified: Secondary | ICD-10-CM | POA: Diagnosis present

## 2020-09-26 DIAGNOSIS — R197 Diarrhea, unspecified: Secondary | ICD-10-CM | POA: Insufficient documentation

## 2020-09-26 DIAGNOSIS — R110 Nausea: Secondary | ICD-10-CM | POA: Diagnosis not present

## 2020-09-26 LAB — CBC
HCT: 35.8 % — ABNORMAL LOW (ref 36.0–46.0)
Hemoglobin: 11.7 g/dL — ABNORMAL LOW (ref 12.0–15.0)
MCH: 30.5 pg (ref 26.0–34.0)
MCHC: 32.7 g/dL (ref 30.0–36.0)
MCV: 93.2 fL (ref 80.0–100.0)
Platelets: 133 10*3/uL — ABNORMAL LOW (ref 150–400)
RBC: 3.84 MIL/uL — ABNORMAL LOW (ref 3.87–5.11)
RDW: 11.9 % (ref 11.5–15.5)
WBC: 12.3 10*3/uL — ABNORMAL HIGH (ref 4.0–10.5)
nRBC: 0 % (ref 0.0–0.2)

## 2020-09-26 LAB — COMPREHENSIVE METABOLIC PANEL
ALT: 48 U/L — ABNORMAL HIGH (ref 0–44)
AST: 64 U/L — ABNORMAL HIGH (ref 15–41)
Albumin: 4.3 g/dL (ref 3.5–5.0)
Alkaline Phosphatase: 111 U/L (ref 38–126)
Anion gap: 11 (ref 5–15)
BUN: 10 mg/dL (ref 6–20)
CO2: 23 mmol/L (ref 22–32)
Calcium: 9.2 mg/dL (ref 8.9–10.3)
Chloride: 104 mmol/L (ref 98–111)
Creatinine, Ser: 0.46 mg/dL (ref 0.44–1.00)
GFR, Estimated: >60 mL/min (ref >60)
Glucose, Bld: 104 mg/dL — ABNORMAL HIGH (ref 70–99)
Potassium: 4 mmol/L (ref 3.5–5.1)
Sodium: 138 mmol/L (ref 135–145)
Total Bilirubin: 0.7 mg/dL (ref 0.3–1.2)
Total Protein: 7.5 g/dL (ref 6.5–8.1)

## 2020-09-26 LAB — I-STAT BETA HCG BLOOD, ED (MC, WL, AP ONLY): I-stat hCG, quantitative: <5 m[IU]/mL (ref <5)

## 2020-09-26 LAB — LIPASE, BLOOD: Lipase: 28 U/L (ref 11–51)

## 2020-09-26 LAB — TROPONIN I (HIGH SENSITIVITY): Troponin I (High Sensitivity): <2 ng/L (ref <18)

## 2020-09-26 NOTE — ED Triage Notes (Signed)
Patient arrived stating over the last three days she began having back pain that today started radiating to her abdomen (primarily LUQ).  Reports nausea and diarrhea. Declines any urinary symptoms.

## 2020-09-27 ENCOUNTER — Ambulatory Visit (INDEPENDENT_AMBULATORY_CARE_PROVIDER_SITE_OTHER): Payer: 59

## 2020-09-27 ENCOUNTER — Ambulatory Visit
Admission: EM | Admit: 2020-09-27 | Discharge: 2020-09-27 | Disposition: A | Discharge status: Home / Self Care | Payer: 59 | Attending: Emergency Medicine | Admitting: Emergency Medicine

## 2020-09-27 DIAGNOSIS — M546 Pain in thoracic spine: Secondary | ICD-10-CM

## 2020-09-27 DIAGNOSIS — R0781 Pleurodynia: Secondary | ICD-10-CM

## 2020-09-27 DIAGNOSIS — R1011 Right upper quadrant pain: Secondary | ICD-10-CM

## 2020-09-27 DIAGNOSIS — Y93B3 Activity, free weights: Secondary | ICD-10-CM

## 2020-09-27 LAB — POCT URINALYSIS DIP (MANUAL ENTRY)
Glucose, UA: NEGATIVE mg/dL
Ketones, POC UA: NEGATIVE mg/dL
Leukocytes, UA: NEGATIVE
Nitrite, UA: NEGATIVE
Protein Ur, POC: 30 mg/dL — AB
Spec Grav, UA: 1.025 (ref 1.010–1.025)
Urobilinogen, UA: 1 E.U./dL
pH, UA: 6 (ref 5.0–8.0)

## 2020-09-27 MED ORDER — TIZANIDINE HCL 4 MG PO TABS: 4.0000 mg | ORAL_TABLET | Freq: Four times a day (QID) | ORAL | 0 refills | Status: DC | PRN

## 2020-09-27 MED ORDER — ONDANSETRON 4 MG PO TBDP: 4.0000 mg | ORAL_TABLET | Freq: Three times a day (TID) | ORAL | 0 refills | Status: DC | PRN

## 2020-09-27 MED ORDER — NAPROXEN 500 MG PO TABS: 500.0000 mg | ORAL_TABLET | Freq: Two times a day (BID) | ORAL | 0 refills | Status: DC

## 2020-09-27 MED ORDER — TRAMADOL HCL 50 MG PO TABS: 50.0000 mg | ORAL_TABLET | Freq: Four times a day (QID) | ORAL | 0 refills | Status: DC | PRN

## 2020-09-27 MED ORDER — KETOROLAC TROMETHAMINE 30 MG/ML IJ SOLN
30.0000 mg | Freq: Once | INTRAMUSCULAR | Status: AC
  Administered 2020-09-27: 30 mg via INTRAMUSCULAR

## 2020-09-27 NOTE — ED Triage Notes (Signed)
Pt c/o mid back pain x5 days that now is radiating around to upper abdomen and has worsen each day. Denies injury. States had blood work at ED last night, unable to stay d/t wait time. Pt denies urinary sx's.

## 2020-09-27 NOTE — ED Provider Notes (Signed)
[name]    CSN: [id number] Arrival date & time: 09/27/20  0813      History   Chief Complaint Chief Complaint  Patient presents with  . Back Pain    HPI Tina Wilson is a 35 y.o. female presenting today for evaluation of back pain.  Patient reports over the past 3 days she has had back pain radiating into her abdomen for the past 3 to 5 days.  Reports that symptoms initially began in her right lower thoracic area, her husband attempted to massage this area and since pain has progressively worsened.  Reports pain radiating into lower ribs and upper abdomen.  Has associated nausea and diarrhea.  Denies any urinary symptoms.  Was in the emergency room yesterday for her symptoms and had blood work drawn, CMP was slightly elevated AST and ALT, white count of 12.3, lipase normal, negative troponins, negative pregnancy.  Has had prior C-section, denies other abdominal surgeries or GI problems.  Pain increases with inspiration as well as certain movements and lying flat.  Does report possible injury with attempting to lift a heavy dog prior to onset.  HPI  Past Medical History:  Diagnosis Date  . Allergy   . Anxiety   . Asthma   . Depression   . Herpes genitalis in women 2010  . Migraine   . Thrombocytopenic disorder [facility])     Patient Active Problem List   Diagnosis Date Noted  . Term pregnancy 12/01/2019  . Rhinosinusitis 06/01/2019  . Thrombocytopenia ([place]) 08/04/2018  . Elevated LDL cholesterol level 08/04/2018  . Sore throat 07/20/2018  . Cough 07/20/2018  . Migraine without aura and without status migrainosus, not intractable 06/23/2018  . Post-dates pregnancy 10/30/2016  . Healthcare maintenance 01/11/2013    Past Surgical History:  Procedure Laterality Date  . CESAREAN SECTION N/A 12/01/2019   Procedure: CESAREAN SECTION;  Surgeon: Everlene Farrier, MD;  Location: [facility];  Service: Obstetrics;  Laterality: N/A;  . NO PAST SURGERIES       OB History    Gravida  2   Para  2   Term  2   Preterm      AB      Living  2     SAB      IAB      Ectopic      Multiple  0   Live Births  2            Home Medications    Prior to Admission medications   Medication Sig Start Date End Date Taking? Authorizing Provider  naproxen (NAPROSYN) 500 MG tablet Take 1 tablet (500 mg total) by mouth 2 (two) times daily. 09/27/20  Yes [name] C, PA-C  ondansetron (ZOFRAN ODT) 4 MG disintegrating tablet Take 1 tablet (4 mg total) by mouth every 8 (eight) hours as needed for nausea or vomiting. 09/27/20  Yes [name] C, PA-C  tiZANidine (ZANAFLEX) 4 MG tablet Take 1 tablet (4 mg total) by mouth every 6 (six) hours as needed for muscle spasms. 09/27/20  Yes [name] C, PA-C  traMADol (ULTRAM) 50 MG tablet Take 1 tablet (50 mg total) by mouth every 6 (six) hours as needed for severe pain. 09/27/20  Yes [name], [name], PA-C    Family History Family History  Problem Relation Age of Onset  . Alcohol abuse Maternal Grandmother   . Breast cancer Paternal Grandmother   . Alcohol abuse Paternal Grandfather   .  Arthritis Mother   . Depression Mother   . Hyperlipidemia Father   . Hypertension Father   . Alcohol abuse Paternal Aunt   . Heart attack Maternal Grandfather   . Stroke Maternal Grandfather   . Cancer Neg Hx   . COPD Neg Hx   . Diabetes Neg Hx   . Drug abuse Neg Hx   . Early death Neg Hx   . Heart disease Neg Hx   . Kidney disease Neg Hx     Social History Social History   Tobacco Use  . Smoking status: Never Smoker  . Smokeless tobacco: Never Used  Vaping Use  . Vaping Use: Never used  Substance Use Topics  . Alcohol use: Yes    Alcohol/week: 14.0 standard drinks    Types: 10 Glasses of wine, 4 Standard drinks or equivalent per week  . Drug use: No    Comment: valtrex     Allergies   Patient has no known allergies.   Review of Systems Review of Systems  Constitutional:  Negative for fatigue and fever.  Eyes: Negative for visual disturbance.  Respiratory: Negative for shortness of breath.   Cardiovascular: Negative for chest pain.  Gastrointestinal: Negative for abdominal pain, nausea and vomiting.  Genitourinary: Negative for decreased urine volume and difficulty urinating.  Musculoskeletal: Positive for back pain and myalgias. Negative for arthralgias and joint swelling.  Skin: Negative for color change, rash and wound.  Neurological: Negative for dizziness, weakness, light-headedness and headaches.     Physical Exam Triage Vital Signs ED Triage Vitals  Enc Vitals Group     BP      Pulse      Resp      Temp      Temp src      SpO2      Weight      Height      Head Circumference      Peak Flow      Pain Score      Pain Loc      Pain Edu?      Excl. in [place]?    No data found.  Updated Vital Signs BP 137/88 (BP Location: Left Arm)   Pulse 82   Temp 98.3 F (36.8 C) (Oral)   Resp 18   LMP 09/26/2020   SpO2 98%   Breastfeeding Yes   Visual Acuity Right Eye Distance:   Left Eye Distance:   Bilateral Distance:    Right Eye Near:   Left Eye Near:    Bilateral Near:     Physical Exam Vitals and nursing note reviewed.  Constitutional:      Appearance: She is well-developed and well-nourished.     Comments: No acute distress  HENT:     Head: Normocephalic and atraumatic.     Nose: Nose normal.  Eyes:     Conjunctiva/sclera: Conjunctivae normal.  Cardiovascular:     Rate and Rhythm: Normal rate and regular rhythm.  Pulmonary:     Effort: Pulmonary effort is normal. No respiratory distress.     Comments: Breathing comfortably at rest, CTABL, no wheezing, rales or other adventitious sounds auscultated Abdominal:     General: There is no distension.     Tenderness: There is abdominal tenderness.     Comments: Soft, nondistended, tender to palpation to upper abdomen and epigastrium, lower abdomen nontender to palpation, slightly  more prominent in right upper quadrant, negative Murphy's, negative rebound  Musculoskeletal:  General: Normal range of motion.     Cervical back: Neck supple.     Comments: Nontender to palpation of cervical thoracic spine midline, increased tenderness throughout right lower thoracic area, right flank and extending into right lower anterior ribs Is also have some tenderness to left lower thoracic area, but not as prominent  Skin:    General: Skin is warm and dry.  Neurological:     Mental Status: She is alert and oriented to person, place, and time.  Psychiatric:        Mood and Affect: Mood and affect normal.      UC Treatments / Results  Labs (all labs ordered are listed, but only abnormal results are displayed) Labs Reviewed  POCT URINALYSIS DIP (MANUAL ENTRY) - Abnormal; Notable for the following components:      Result Value   Bilirubin, UA moderate (*)    Blood, UA moderate (*)    Protein Ur, POC =30 (*)    All other components within normal limits    EKG   Radiology DG Ribs Unilateral W/Chest Right  Result Date: 09/27/2020 CLINICAL DATA:  Right lower rib pain for 5 days after lifting injury. EXAM: RIGHT RIBS AND CHEST - 3+ VIEW COMPARISON:  None. FINDINGS: A metallic BB is visible posteromedially in the lower right chest, indicating the area of pain. No evidence of rib fracture or focal rib lesion. The heart size and mediastinal contours are normal. The lungs are clear. There is no pleural effusion or pneumothorax. IMPRESSION: No evidence of rib fracture, pleural effusion or pneumothorax. Electronically Signed   By: Richardean Sale M.D.   On: 09/27/2020 09:26    Procedures Procedures (including critical care time)  Medications Ordered in UC Medications  ketorolac (TORADOL) 30 MG/ML injection 30 mg (30 mg Intramuscular Given 09/27/20 0949)    Initial Impression / Assessment and Plan / UC Course  I have reviewed the triage vital signs and the nursing  notes.  Pertinent labs & imaging results that were available during my care of the patient were reviewed by me and considered in my medical decision making (see chart for details).    X-ray negative for any signs of fractures, lungs clear, given slightly elevated LFTs along with bilirubin and urine does make symptoms concerning for possible gallbladder etiology and recommending outpatient follow-up with PCP for outpatient ultrasound, patient to return to ED if symptoms worsening.  Possible MSK etiology as well contributing.  Opted to treat with Toradol prior to discharge, continue anti-inflammatories muscle relaxers, Zofran for nausea, tramadol for severe pain to use sparingly over the next 2 days until able to follow-up.  Has PCP follow-up tomorrow.  Discussed strict return precautions. Patient verbalized understanding and is agreeable with plan.   Final Clinical Impressions(s) / UC Diagnoses   Final diagnoses:  Acute right-sided thoracic back pain  RUQ pain     Discharge Instructions     Blood work and urine suggestive of possible gallbladder cause of pain-I recommend follow-up ultrasound We gave you a shot of Toradol Continue with Naprosyn twice daily as needed for pain May supplement with tizanidine/Zanaflex which is the muscle relaxer at home/bedtime, may cause drowsiness Tramadol only for severe pain, use sparingly, will also cause drowsiness Zofran as needed for nausea Follow-up with primary care as planned If symptoms progressing or worsening please go to emergency room    ED Prescriptions    Medication Sig Dispense Auth. Provider   ondansetron (ZOFRAN ODT) 4 MG disintegrating tablet  Take 1 tablet (4 mg total) by mouth every 8 (eight) hours as needed for nausea or vomiting. 20 tablet Verneal Wiers C, PA-C   tiZANidine (ZANAFLEX) 4 MG tablet Take 1 tablet (4 mg total) by mouth every 6 (six) hours as needed for muscle spasms. 30 tablet Hero Mccathern C, PA-C   naproxen  (NAPROSYN) 500 MG tablet Take 1 tablet (500 mg total) by mouth 2 (two) times daily. 30 tablet Barry Faircloth C, PA-C   traMADol (ULTRAM) 50 MG tablet Take 1 tablet (50 mg total) by mouth every 6 (six) hours as needed for severe pain. 8 tablet Asad Keeven, [place] C, PA-C     I have reviewed the PDMP during this encounter.   [place], [place] C, PA-C 09/27/20 1018

## 2020-09-27 NOTE — Discharge Instructions (Addendum)
Blood work and urine suggestive of possible gallbladder cause of pain-I recommend follow-up ultrasound We gave you a shot of Toradol Continue with Naprosyn twice daily as needed for pain May supplement with tizanidine/Zanaflex which is the muscle relaxer at home/bedtime, may cause drowsiness Tramadol only for severe pain, use sparingly, will also cause drowsiness Zofran as needed for nausea Follow-up with primary care as planned If symptoms progressing or worsening please go to emergency room

## 2020-09-28 ENCOUNTER — Ambulatory Visit: Payer: 59 | Admitting: Physician Assistant

## 2020-09-28 ENCOUNTER — Encounter: Payer: Self-pay | Admitting: Physician Assistant

## 2020-09-28 VITALS — Ht 62.0 in | Wt 165.0 lb

## 2020-09-28 DIAGNOSIS — F419 Anxiety disorder, unspecified: Secondary | ICD-10-CM | POA: Diagnosis not present

## 2020-09-28 DIAGNOSIS — Z8659 Personal history of other mental and behavioral disorders: Secondary | ICD-10-CM

## 2020-09-28 DIAGNOSIS — R1011 Right upper quadrant pain: Secondary | ICD-10-CM

## 2020-09-28 MED ORDER — FLUOXETINE HCL 20 MG PO TABS: ORAL_TABLET | ORAL | 1 refills | Status: DC

## 2020-09-28 NOTE — Progress Notes (Signed)
Telehealth office visit note for Tina Reid, PA-C- at Primary Care at [facility]   I connected with current patient today by telephone and verified that I am speaking with the correct person   . Location of the patient: Home . Location of the provider: Office - This visit type was conducted due to national recommendations for restrictions regarding the COVID-19 Pandemic (e.g. social distancing) in an effort to limit this patient's exposure and mitigate transmission in our community.    - No physical exam could be performed with this format, beyond that communicated to [place] by the patient/ family members as noted.   - Additionally my office staff/ schedulers were to discuss with the patient that there may be a monetary charge related to this service, depending on their medical insurance.  My understanding is that patient understood and consented to proceed.     _________________________________________________________________________________   History of Present Illness: Patient calls in with complaints of severe abdominal pain underneath right breast that radiates to her back x 6 days. Patient went to ER 09/27/2019 but left due to long waiting period and went to [facility] yesterday for evaluation and they recommended abdominal ultrasound to evaluate for possible gallbladder etiology. Pain is constant and intensity/severity comes and goes. Also reports nausea and decreased appetite. Was given Tramadol for pain but reports threw up after taking one pill. Also has complaints of anxiety and panic attacks. In the past was seen by Neurology for migraines and started on nortriptyline which she feels triggered her anxiety and panic attacks. The pandemic has worsened those symptoms. States gets anxious when going to the stores and when people are not wearing their masks.  In her adolescence was on antidepressant (Prozac) which she tolerated well and was able to discontinue once she started college and  manage with breathing exercises and exercise.      No flowsheet data found.  Depression screen [facility] 2/9 09/28/2020 06/01/2019 08/04/2018 07/20/2018 06/23/2018  Decreased Interest 0 0 0 0 0  Down, Depressed, Hopeless 0 0 0 0 1  PHQ - 2 Score 0 0 0 0 1  Altered sleeping 0 1 1 1 3   Tired, decreased energy 0 3 0 0 1  Change in appetite 0 0 0 1 0  Feeling bad or failure about yourself  0 0 2 1 0  Trouble concentrating 0 0 0 0 0  Moving slowly or fidgety/restless 0 0 0 0 0  Suicidal thoughts 0 0 0 0 0  PHQ-9 Score 0 4 3 3 5   Difficult doing work/chores - Not difficult at all Somewhat difficult - Somewhat difficult      Impression and Recommendations:     1. RUQ abdominal pain   2. Anxiety   3. History of panic attacks     RUQ abdominal pain: -Reviewed UC note and recent labs and in agreement with possible GI etiology (gallbladder) so will place stat order for a limited ABD [place]. Pending imaging studies will place appropriate specialty referral. -If symptoms worsen recommend to seek immediate medical care. -Advised to take half tablet of Tramadol (it's non-ER form). Continue Zofran as needed for nausea.   Anxiety, History of panic attacks: -Discussed with patient management options including medications and potential side effects. Patient has tolerated Prozac in the past and is agreeable to starting medication. Patient prefers to wait on starting additional medications such as hydroxyzine to take prn for severe anxiety/panic attack. -Recommend to continue with nonpharmacologic therapy. -  Follow up in 6 weeks to reassess symptoms and medication therapy.    - As part of my medical decision making, I reviewed the following data within the [place] History obtained from pt /family, CMA notes reviewed and incorporated if applicable, Labs reviewed, Radiograph/ tests reviewed if applicable and OV notes from prior OV's with me, as well as any other specialists she/he has seen  since seeing me last, were all reviewed and used in my medical decision making process today.    - Additionally, when appropriate, discussion had with patient regarding our treatment plan, and their biases/concerns about that plan were used in my medical decision making today.    - The patient agreed with the plan and demonstrated an understanding of the instructions.   No barriers to understanding were identified.     - The patient was advised to call back or seek an in-person evaluation if the symptoms worsen or if the condition fails to improve as anticipated.   Return in about 6 weeks (around 11/09/2020) for Mood- started med.    Orders Placed This Encounter  Procedures  . US Abdomen Limited RUQ (LIVER/GB)    Meds ordered this encounter  Medications  . FLUoxetine (PROZAC) 20 MG tablet    Sig: Take 0.5 (10mg ) tablet once daily for 7 days. Then take 1 tablet once daily.    Dispense:  30 tablet    Refill:  1    Order Specific Question:   Supervising Provider    Answer:   Beatrice Lecher D [[id number]]    There are no discontinued medications.     Time spent on visit including pre-visit chart review and post-visit care was 20 minutes.      The [place] was signed into law in 2016 which includes the topic of electronic health records.  This provides immediate access to information in MyChart.  This includes consultation notes, operative notes, office notes, lab results and pathology reports.  If you have any questions about what you read please let us know at your next visit or call us at the office.  We are right here with you.  Note:  This note was prepared with assistance of Dragon voice recognition software. Occasional wrong-word or sound-a-like substitutions may have occurred due to the inherent limitations of voice recognition software.  __________________________________________________________________________________     Patient Care Team     Relationship Specialty Notifications Start End  Tina Wilson, [place] PCP - General Physician Assistant  09/28/20   [name], MD  Internal Medicine  01/11/13    Comment: Iona Hansen)   Everlene Farrier, MD Consulting Physician Obstetrics and Gynecology  06/23/18      -Vitals obtained; medications/ allergies reconciled;  personal medical, social, Sx etc.histories were updated by CMA, reviewed by me and are reflected in chart   Patient Active Problem List   Diagnosis Date Noted  . Term pregnancy 12/01/2019  . Rhinosinusitis 06/01/2019  . Thrombocytopenia ([place]) 08/04/2018  . Elevated LDL cholesterol level 08/04/2018  . Sore throat 07/20/2018  . Cough 07/20/2018  . Migraine without aura and without status migrainosus, not intractable 06/23/2018  . Post-dates pregnancy 10/30/2016  . Healthcare maintenance 01/11/2013     Current Meds  Medication Sig  . FLUoxetine (PROZAC) 20 MG tablet Take 0.5 (10mg ) tablet once daily for 7 days. Then take 1 tablet once daily.  . naproxen (NAPROSYN) 500 MG tablet Take 1 tablet (500 mg total) by mouth 2 (two) times daily.  Marland Kitchen  ondansetron (ZOFRAN ODT) 4 MG disintegrating tablet Take 1 tablet (4 mg total) by mouth every 8 (eight) hours as needed for nausea or vomiting.  Marland Kitchen tiZANidine (ZANAFLEX) 4 MG tablet Take 1 tablet (4 mg total) by mouth every 6 (six) hours as needed for muscle spasms.  . traMADol (ULTRAM) 50 MG tablet Take 1 tablet (50 mg total) by mouth every 6 (six) hours as needed for severe pain.     Allergies:  No Known Allergies   ROS:  See above HPI for pertinent positives and negatives   Objective:   Height 5\' 2"  (1.575 m), weight 165 lb (74.8 kg), last menstrual period 09/26/2020, currently breastfeeding.  (if some vitals are omitted, this means that patient was UNABLE to obtain them.) General: A & O * 3; sounds in no acute distress but does sound uncomfortable  Respiratory: speaking in full sentences, no conversational dyspnea Psych:  insight appears good, mood- appears normal

## 2020-09-29 ENCOUNTER — Encounter: Payer: Self-pay | Admitting: Physician Assistant

## 2020-09-29 ENCOUNTER — Encounter: Payer: Self-pay | Admitting: Emergency Medicine

## 2020-09-29 ENCOUNTER — Other Ambulatory Visit: Payer: Self-pay

## 2020-09-29 ENCOUNTER — Ambulatory Visit
Admission: RE | Admit: 2020-09-29 | Discharge: 2020-09-29 | Disposition: A | Discharge status: Home / Self Care | Payer: 59 | Source: Ambulatory Visit | Attending: Physician Assistant | Admitting: Physician Assistant

## 2020-09-29 DIAGNOSIS — J45909 Unspecified asthma, uncomplicated: Secondary | ICD-10-CM | POA: Diagnosis present

## 2020-09-29 DIAGNOSIS — Z79899 Other long term (current) drug therapy: Secondary | ICD-10-CM

## 2020-09-29 DIAGNOSIS — D696 Thrombocytopenia, unspecified: Secondary | ICD-10-CM | POA: Diagnosis present

## 2020-09-29 DIAGNOSIS — E876 Hypokalemia: Secondary | ICD-10-CM | POA: Diagnosis not present

## 2020-09-29 DIAGNOSIS — M546 Pain in thoracic spine: Secondary | ICD-10-CM | POA: Diagnosis present

## 2020-09-29 DIAGNOSIS — Z823 Family history of stroke: Secondary | ICD-10-CM

## 2020-09-29 DIAGNOSIS — F419 Anxiety disorder, unspecified: Secondary | ICD-10-CM | POA: Diagnosis present

## 2020-09-29 DIAGNOSIS — Z818 Family history of other mental and behavioral disorders: Secondary | ICD-10-CM

## 2020-09-29 DIAGNOSIS — Z803 Family history of malignant neoplasm of breast: Secondary | ICD-10-CM

## 2020-09-29 DIAGNOSIS — K8063 Calculus of gallbladder and bile duct with acute cholecystitis with obstruction: Principal | ICD-10-CM | POA: Diagnosis present

## 2020-09-29 DIAGNOSIS — Z20822 Contact with and (suspected) exposure to covid-19: Secondary | ICD-10-CM | POA: Diagnosis present

## 2020-09-29 DIAGNOSIS — Z8249 Family history of ischemic heart disease and other diseases of the circulatory system: Secondary | ICD-10-CM

## 2020-09-29 DIAGNOSIS — K219 Gastro-esophageal reflux disease without esophagitis: Secondary | ICD-10-CM | POA: Diagnosis present

## 2020-09-29 DIAGNOSIS — R1011 Right upper quadrant pain: Secondary | ICD-10-CM

## 2020-09-29 DIAGNOSIS — F32A Depression, unspecified: Secondary | ICD-10-CM | POA: Diagnosis present

## 2020-09-29 DIAGNOSIS — Z83438 Family history of other disorder of lipoprotein metabolism and other lipidemia: Secondary | ICD-10-CM

## 2020-09-29 DIAGNOSIS — Z8261 Family history of arthritis: Secondary | ICD-10-CM

## 2020-09-29 DIAGNOSIS — G43909 Migraine, unspecified, not intractable, without status migrainosus: Secondary | ICD-10-CM | POA: Diagnosis present

## 2020-09-29 LAB — COMPREHENSIVE METABOLIC PANEL
ALT: 1220 U/L — ABNORMAL HIGH (ref 0–44)
AST: 495 U/L — ABNORMAL HIGH (ref 15–41)
Albumin: 4.5 g/dL (ref 3.5–5.0)
Alkaline Phosphatase: 489 U/L — ABNORMAL HIGH (ref 38–126)
Anion gap: 14 (ref 5–15)
BUN: 9 mg/dL (ref 6–20)
CO2: 24 mmol/L (ref 22–32)
Calcium: 9.2 mg/dL (ref 8.9–10.3)
Chloride: 102 mmol/L (ref 98–111)
Creatinine, Ser: 0.33 mg/dL — ABNORMAL LOW (ref 0.44–1.00)
GFR, Estimated: >60 mL/min (ref >60)
Glucose, Bld: 98 mg/dL (ref 70–99)
Potassium: 4.3 mmol/L (ref 3.5–5.1)
Sodium: 140 mmol/L (ref 135–145)
Total Bilirubin: 7.1 mg/dL — ABNORMAL HIGH (ref 0.3–1.2)
Total Protein: 8.2 g/dL — ABNORMAL HIGH (ref 6.5–8.1)

## 2020-09-29 LAB — CBC
HCT: 38.8 % (ref 36.0–46.0)
Hemoglobin: 12.9 g/dL (ref 12.0–15.0)
MCH: 30.9 pg (ref 26.0–34.0)
MCHC: 33.2 g/dL (ref 30.0–36.0)
MCV: 92.8 fL (ref 80.0–100.0)
Platelets: 146 10*3/uL — ABNORMAL LOW (ref 150–400)
RBC: 4.18 MIL/uL (ref 3.87–5.11)
RDW: 12.5 % (ref 11.5–15.5)
WBC: 7.5 10*3/uL (ref 4.0–10.5)
nRBC: 0 % (ref 0.0–0.2)

## 2020-09-29 LAB — LIPASE, BLOOD: Lipase: 28 U/L (ref 11–51)

## 2020-09-29 NOTE — ED Triage Notes (Signed)
Pt reports she was told she would not be able to have surgery until next week so she needed to come here for pain control

## 2020-09-29 NOTE — ED Triage Notes (Signed)
Pt reports was in the ED several nights ago but did not stay because of the wait. Pt reports followed up with her MD and had an US done today and was told to come to the ED for gallstones

## 2020-09-30 ENCOUNTER — Inpatient Hospital Stay
Admission: EM | Admit: 2020-09-30 | Discharge: 2020-10-03 | DRG: 419 | Disposition: A | Discharge status: Home / Self Care | Payer: 59 | Attending: Internal Medicine | Admitting: Internal Medicine

## 2020-09-30 ENCOUNTER — Emergency Department: Payer: 59

## 2020-09-30 ENCOUNTER — Encounter: Payer: Self-pay | Admitting: Family Medicine

## 2020-09-30 ENCOUNTER — Inpatient Hospital Stay: Payer: 59

## 2020-09-30 ENCOUNTER — Inpatient Hospital Stay: Payer: 59 | Admitting: Anesthesiology

## 2020-09-30 ENCOUNTER — Encounter
Admission: EM | Disposition: A | Discharge status: Home / Self Care | Payer: Self-pay | Source: Home / Self Care | Attending: Internal Medicine

## 2020-09-30 DIAGNOSIS — K219 Gastro-esophageal reflux disease without esophagitis: Secondary | ICD-10-CM | POA: Diagnosis present

## 2020-09-30 DIAGNOSIS — K8040 Calculus of bile duct with cholecystitis, unspecified, without obstruction: Secondary | ICD-10-CM | POA: Diagnosis not present

## 2020-09-30 DIAGNOSIS — K8050 Calculus of bile duct without cholangitis or cholecystitis without obstruction: Secondary | ICD-10-CM | POA: Diagnosis present

## 2020-09-30 DIAGNOSIS — E876 Hypokalemia: Secondary | ICD-10-CM | POA: Diagnosis not present

## 2020-09-30 DIAGNOSIS — F419 Anxiety disorder, unspecified: Secondary | ICD-10-CM | POA: Diagnosis present

## 2020-09-30 DIAGNOSIS — F32A Depression, unspecified: Secondary | ICD-10-CM | POA: Diagnosis present

## 2020-09-30 DIAGNOSIS — K8071 Calculus of gallbladder and bile duct without cholecystitis with obstruction: Secondary | ICD-10-CM | POA: Diagnosis not present

## 2020-09-30 DIAGNOSIS — Z803 Family history of malignant neoplasm of breast: Secondary | ICD-10-CM | POA: Diagnosis not present

## 2020-09-30 DIAGNOSIS — Z8261 Family history of arthritis: Secondary | ICD-10-CM | POA: Diagnosis not present

## 2020-09-30 DIAGNOSIS — R748 Abnormal levels of other serum enzymes: Secondary | ICD-10-CM

## 2020-09-30 DIAGNOSIS — Z818 Family history of other mental and behavioral disorders: Secondary | ICD-10-CM | POA: Diagnosis not present

## 2020-09-30 DIAGNOSIS — Z79899 Other long term (current) drug therapy: Secondary | ICD-10-CM | POA: Diagnosis not present

## 2020-09-30 DIAGNOSIS — J45909 Unspecified asthma, uncomplicated: Secondary | ICD-10-CM | POA: Diagnosis present

## 2020-09-30 DIAGNOSIS — Z8249 Family history of ischemic heart disease and other diseases of the circulatory system: Secondary | ICD-10-CM | POA: Diagnosis not present

## 2020-09-30 DIAGNOSIS — Z83438 Family history of other disorder of lipoprotein metabolism and other lipidemia: Secondary | ICD-10-CM | POA: Diagnosis not present

## 2020-09-30 DIAGNOSIS — R109 Unspecified abdominal pain: Secondary | ICD-10-CM

## 2020-09-30 DIAGNOSIS — D696 Thrombocytopenia, unspecified: Secondary | ICD-10-CM | POA: Diagnosis present

## 2020-09-30 DIAGNOSIS — R1011 Right upper quadrant pain: Secondary | ICD-10-CM

## 2020-09-30 DIAGNOSIS — Z20822 Contact with and (suspected) exposure to covid-19: Secondary | ICD-10-CM | POA: Diagnosis present

## 2020-09-30 DIAGNOSIS — K8063 Calculus of gallbladder and bile duct with acute cholecystitis with obstruction: Secondary | ICD-10-CM | POA: Diagnosis present

## 2020-09-30 DIAGNOSIS — G43909 Migraine, unspecified, not intractable, without status migrainosus: Secondary | ICD-10-CM | POA: Diagnosis present

## 2020-09-30 DIAGNOSIS — M546 Pain in thoracic spine: Secondary | ICD-10-CM | POA: Diagnosis present

## 2020-09-30 DIAGNOSIS — K8012 Calculus of gallbladder with acute and chronic cholecystitis without obstruction: Secondary | ICD-10-CM | POA: Diagnosis not present

## 2020-09-30 DIAGNOSIS — Z823 Family history of stroke: Secondary | ICD-10-CM | POA: Diagnosis not present

## 2020-09-30 HISTORY — PX: ENDOSCOPIC RETROGRADE CHOLANGIOPANCREATOGRAPHY (ERCP) WITH PROPOFOL: SHX5810

## 2020-09-30 LAB — CBC
HCT: 35.1 % — ABNORMAL LOW (ref 36.0–46.0)
Hemoglobin: 11.5 g/dL — ABNORMAL LOW (ref 12.0–15.0)
MCH: 30.7 pg (ref 26.0–34.0)
MCHC: 32.8 g/dL (ref 30.0–36.0)
MCV: 93.9 fL (ref 80.0–100.0)
Platelets: 124 10*3/uL — ABNORMAL LOW (ref 150–400)
RBC: 3.74 MIL/uL — ABNORMAL LOW (ref 3.87–5.11)
RDW: 12.5 % (ref 11.5–15.5)
WBC: 6.6 10*3/uL (ref 4.0–10.5)
nRBC: 0 % (ref 0.0–0.2)

## 2020-09-30 LAB — COMPREHENSIVE METABOLIC PANEL
ALT: 1076 U/L — ABNORMAL HIGH (ref 0–44)
AST: 419 U/L — ABNORMAL HIGH (ref 15–41)
Albumin: 3.8 g/dL (ref 3.5–5.0)
Alkaline Phosphatase: 467 U/L — ABNORMAL HIGH (ref 38–126)
Anion gap: 11 (ref 5–15)
BUN: 8 mg/dL (ref 6–20)
CO2: 22 mmol/L (ref 22–32)
Calcium: 8.2 mg/dL — ABNORMAL LOW (ref 8.9–10.3)
Chloride: 105 mmol/L (ref 98–111)
Creatinine, Ser: 0.36 mg/dL — ABNORMAL LOW (ref 0.44–1.00)
GFR, Estimated: >60 mL/min (ref >60)
Glucose, Bld: 73 mg/dL (ref 70–99)
Potassium: 3.9 mmol/L (ref 3.5–5.1)
Sodium: 138 mmol/L (ref 135–145)
Total Bilirubin: 7.1 mg/dL — ABNORMAL HIGH (ref 0.3–1.2)
Total Protein: 7.2 g/dL (ref 6.5–8.1)

## 2020-09-30 LAB — HEPATITIS PANEL, ACUTE
HCV Ab: NONREACTIVE
Hep A IgM: NONREACTIVE
Hep B C IgM: NONREACTIVE
Hepatitis B Surface Ag: NONREACTIVE

## 2020-09-30 LAB — POC URINE PREG, ED: Preg Test, Ur: NEGATIVE

## 2020-09-30 LAB — HIV ANTIBODY (ROUTINE TESTING W REFLEX): HIV Screen 4th Generation wRfx: NONREACTIVE

## 2020-09-30 LAB — RESP PANEL BY RT-PCR (FLU A&B, COVID) ARPGX2
Influenza A by PCR: NEGATIVE
Influenza B by PCR: NEGATIVE
SARS Coronavirus 2 by RT PCR: NEGATIVE

## 2020-09-30 SURGERY — ENDOSCOPIC RETROGRADE CHOLANGIOPANCREATOGRAPHY (ERCP) WITH PROPOFOL
Anesthesia: General

## 2020-09-30 MED ORDER — MIDAZOLAM HCL 2 MG/2ML IJ SOLN
INTRAMUSCULAR | Status: AC
  Filled 2020-09-30: qty 2

## 2020-09-30 MED ORDER — INDOCYANINE GREEN 25 MG IV SOLR
7.5000 mg | Freq: Once | INTRAVENOUS | Status: DC
  Filled 2020-09-30: qty 10

## 2020-09-30 MED ORDER — ENOXAPARIN SODIUM 40 MG/0.4ML ~~LOC~~ SOLN: 40.0000 mg | SUBCUTANEOUS | Status: DC

## 2020-09-30 MED ORDER — ONDANSETRON HCL 4 MG/2ML IJ SOLN
4.0000 mg | Freq: Four times a day (QID) | INTRAMUSCULAR | Status: DC | PRN
  Administered 2020-09-30 (×3): 4 mg via INTRAVENOUS
  Filled 2020-09-30 (×3): qty 2

## 2020-09-30 MED ORDER — INFLUENZA VAC SPLIT QUAD 0.5 ML IM SUSY: 0.5000 mL | PREFILLED_SYRINGE | INTRAMUSCULAR | Status: DC

## 2020-09-30 MED ORDER — SODIUM CHLORIDE 0.9 % IV SOLN
1.0000 g | Freq: Once | INTRAVENOUS | Status: DC
  Filled 2020-09-30 (×2): qty 1

## 2020-09-30 MED ORDER — TIZANIDINE HCL 4 MG PO TABS
4.0000 mg | ORAL_TABLET | Freq: Four times a day (QID) | ORAL | Status: DC | PRN
  Filled 2020-09-30: qty 1

## 2020-09-30 MED ORDER — PROPOFOL 500 MG/50ML IV EMUL
INTRAVENOUS | Status: DC | PRN
  Administered 2020-09-30: 150 ug/kg/min via INTRAVENOUS

## 2020-09-30 MED ORDER — FENTANYL CITRATE (PF) 100 MCG/2ML IJ SOLN
INTRAMUSCULAR | Status: AC
  Filled 2020-09-30: qty 2

## 2020-09-30 MED ORDER — MIDAZOLAM HCL 2 MG/2ML IJ SOLN
INTRAMUSCULAR | Status: DC | PRN
  Administered 2020-09-30: 1 mg via INTRAVENOUS

## 2020-09-30 MED ORDER — LIDOCAINE HCL (CARDIAC) PF 100 MG/5ML IV SOSY
PREFILLED_SYRINGE | INTRAVENOUS | Status: DC | PRN
  Administered 2020-09-30: 60 mg via INTRATRACHEAL

## 2020-09-30 MED ORDER — ACETAMINOPHEN 650 MG RE SUPP: 650.0000 mg | Freq: Four times a day (QID) | RECTAL | Status: DC | PRN

## 2020-09-30 MED ORDER — FENTANYL CITRATE (PF) 100 MCG/2ML IJ SOLN
INTRAMUSCULAR | Status: DC | PRN
  Administered 2020-09-30: 50 ug via INTRAVENOUS

## 2020-09-30 MED ORDER — FLUOXETINE HCL 10 MG PO CAPS
10.0000 mg | ORAL_CAPSULE | Freq: Every day | ORAL | Status: DC
  Filled 2020-09-30 (×4): qty 1

## 2020-09-30 MED ORDER — INDOMETHACIN 50 MG RE SUPP
100.0000 mg | Freq: Once | RECTAL | Status: AC
  Filled 2020-09-30: qty 2

## 2020-09-30 MED ORDER — ONDANSETRON 4 MG PO TBDP: 4.0000 mg | ORAL_TABLET | Freq: Three times a day (TID) | ORAL | Status: DC | PRN

## 2020-09-30 MED ORDER — ONDANSETRON HCL 4 MG/2ML IJ SOLN
4.0000 mg | Freq: Once | INTRAMUSCULAR | Status: AC
  Administered 2020-09-30: 4 mg via INTRAVENOUS
  Filled 2020-09-30: qty 2

## 2020-09-30 MED ORDER — MAGNESIUM HYDROXIDE 400 MG/5ML PO SUSP: 30.0000 mL | Freq: Every day | ORAL | Status: DC | PRN

## 2020-09-30 MED ORDER — SODIUM CHLORIDE 0.9 % IV SOLN: INTRAVENOUS | Status: DC

## 2020-09-30 MED ORDER — ACETAMINOPHEN 325 MG PO TABS: 650.0000 mg | ORAL_TABLET | Freq: Four times a day (QID) | ORAL | Status: DC | PRN

## 2020-09-30 MED ORDER — ONDANSETRON HCL 4 MG/2ML IJ SOLN
INTRAMUSCULAR | Status: DC | PRN
  Administered 2020-09-30: 4 mg via INTRAVENOUS

## 2020-09-30 MED ORDER — ONDANSETRON HCL 4 MG PO TABS: 4.0000 mg | ORAL_TABLET | Freq: Four times a day (QID) | ORAL | Status: DC | PRN

## 2020-09-30 MED ORDER — TRAMADOL HCL 50 MG PO TABS: 50.0000 mg | ORAL_TABLET | Freq: Four times a day (QID) | ORAL | Status: DC | PRN

## 2020-09-30 MED ORDER — DEXAMETHASONE SODIUM PHOSPHATE 10 MG/ML IJ SOLN
INTRAMUSCULAR | Status: DC | PRN
  Administered 2020-09-30: 10 mg via INTRAVENOUS

## 2020-09-30 MED ORDER — TRAZODONE HCL 50 MG PO TABS: 25.0000 mg | ORAL_TABLET | Freq: Every evening | ORAL | Status: DC | PRN

## 2020-09-30 MED ORDER — MORPHINE SULFATE (PF) 4 MG/ML IV SOLN
4.0000 mg | INTRAVENOUS | Status: DC | PRN
Start: 2020-09-30 — End: 2020-10-01
  Administered 2020-09-30 (×4): 4 mg via INTRAVENOUS
  Filled 2020-09-30 (×4): qty 1

## 2020-09-30 MED ORDER — KETOROLAC TROMETHAMINE 30 MG/ML IJ SOLN
15.0000 mg | Freq: Once | INTRAMUSCULAR | Status: AC
  Administered 2020-09-30: 15 mg via INTRAVENOUS
  Filled 2020-09-30: qty 1

## 2020-09-30 MED ORDER — INDOMETHACIN 50 MG RE SUPP
RECTAL | Status: AC
  Administered 2020-09-30: 100 mg via RECTAL
  Filled 2020-09-30: qty 2

## 2020-09-30 MED ORDER — LACTATED RINGERS IV SOLN: INTRAVENOUS | Status: DC

## 2020-09-30 MED ORDER — PROPOFOL 10 MG/ML IV BOLUS
INTRAVENOUS | Status: DC | PRN
  Administered 2020-09-30: 10 mg via INTRAVENOUS
  Administered 2020-09-30: 20 mg via INTRAVENOUS
  Administered 2020-09-30: 40 mg via INTRAVENOUS
  Administered 2020-09-30: 50 mg via INTRAVENOUS

## 2020-09-30 MED ORDER — PROPOFOL 10 MG/ML IV BOLUS
INTRAVENOUS | Status: AC
  Filled 2020-09-30: qty 20

## 2020-09-30 NOTE — ED Notes (Signed)
[facility] not printing labels. COVID swab collected and sent to lab with patient label stickers.

## 2020-09-30 NOTE — ED Provider Notes (Signed)
[facility] Emergency Department Provider Note    Event Date/Time   First MD Initiated Contact with Patient 09/30/20 [phone]     (approximate)  I have reviewed the triage vital signs and the nursing notes.   HISTORY  Chief Complaint Abdominal Pain    HPI Tina Wilson is a 35 y.o. female bolus past medical history presents to the ER for evaluation of several days of worsening right upper quadrant abdominal pain associate with nausea vomiting. No fevers. Had outpatient work-up with differential and an ultrasound being performed today showing evidence of cholelithiasis with common bile duct dilatation. Blood work also showing increasing elevated bilirubin and LFTs. Currently rates the pain as mild to moderate.    Past Medical History:  Diagnosis Date  . Allergy   . Anxiety   . Asthma   . Depression   . Herpes genitalis in women 2010  . Migraine   . Thrombocytopenic disorder ([place])    Family History  Problem Relation Age of Onset  . Alcohol abuse Maternal Grandmother   . Breast cancer Paternal Grandmother   . Alcohol abuse Paternal Grandfather   . Arthritis Mother   . Depression Mother   . Hyperlipidemia Father   . Hypertension Father   . Alcohol abuse Paternal Aunt   . Heart attack Maternal Grandfather   . Stroke Maternal Grandfather   . Cancer Neg Hx   . COPD Neg Hx   . Diabetes Neg Hx   . Drug abuse Neg Hx   . Early death Neg Hx   . Heart disease Neg Hx   . Kidney disease Neg Hx    Past Surgical History:  Procedure Laterality Date  . CESAREAN SECTION N/A 12/01/2019   Procedure: CESAREAN SECTION;  Surgeon: Everlene Farrier, MD;  Location: [place] [facility] ORS;  Service: Obstetrics;  Laterality: N/A;  . NO PAST SURGERIES     Patient Active Problem List   Diagnosis Date Noted  . Term pregnancy 12/01/2019  . Rhinosinusitis 06/01/2019  . Thrombocytopenia ([place]) 08/04/2018  . Elevated LDL cholesterol level 08/04/2018  . Sore throat 07/20/2018   . Cough 07/20/2018  . Migraine without aura and without status migrainosus, not intractable 06/23/2018  . Post-dates pregnancy 10/30/2016  . Healthcare maintenance 01/11/2013      Prior to Admission medications   Medication Sig Start Date End Date Taking? Authorizing Provider  FLUoxetine (PROZAC) 20 MG tablet Take 0.5 (10mg ) tablet once daily for 7 days. Then take 1 tablet once daily. 09/28/20   Lorrene Reid, PA-C  naproxen (NAPROSYN) 500 MG tablet Take 1 tablet (500 mg total) by mouth 2 (two) times daily. 09/27/20   Wieters, Hallie C, PA-C  ondansetron (ZOFRAN ODT) 4 MG disintegrating tablet Take 1 tablet (4 mg total) by mouth every 8 (eight) hours as needed for nausea or vomiting. 09/27/20   Wieters, Hallie C, PA-C  tiZANidine (ZANAFLEX) 4 MG tablet Take 1 tablet (4 mg total) by mouth every 6 (six) hours as needed for muscle spasms. 09/27/20   Wieters, Hallie C, PA-C  traMADol (ULTRAM) 50 MG tablet Take 1 tablet (50 mg total) by mouth every 6 (six) hours as needed for severe pain. 09/27/20   Wieters, Hallie C, PA-C    Allergies Patient has no known allergies.    Social History Social History   Tobacco Use  . Smoking status: Never Smoker  . Smokeless tobacco: Never Used  Vaping Use  . Vaping Use: Never used  Substance Use  Topics  . Alcohol use: Yes    Alcohol/week: 14.0 standard drinks    Types: 10 Glasses of wine, 4 Standard drinks or equivalent per week  . Drug use: No    Comment: valtrex    Review of Systems Patient denies headaches, rhinorrhea, blurry vision, numbness, shortness of breath, chest pain, edema, cough, abdominal pain, nausea, vomiting, diarrhea, dysuria, fevers, rashes or hallucinations unless otherwise stated above in HPI. ____________________________________________   PHYSICAL EXAM:  VITAL SIGNS: Vitals:   09/29/20 1419 09/30/20 0043  BP: 129/75 133/79  Pulse: 78 87  Resp: 20 16  Temp: 98.6 F (37 C) 98 F (36.7 C)  SpO2: 100% 100%     Constitutional: Alert and oriented.  Eyes: Conjunctivae are normal.  Head: Atraumatic. Nose: No congestion/rhinnorhea. Mouth/Throat: Mucous membranes are moist.   Neck: No stridor. Painless ROM.  Cardiovascular: Normal rate, regular rhythm. Grossly normal heart sounds.  Good peripheral circulation. Respiratory: Normal respiratory effort.  No retractions. Lungs CTAB. Gastrointestinal: Soft with mild ruq ttp. No distention. No abdominal bruits. No CVA tenderness. Genitourinary:  Musculoskeletal: No lower extremity tenderness nor edema.  No joint effusions. Neurologic:  Normal speech and language. No gross focal neurologic deficits are appreciated. No facial droop Skin:  Skin is warm, dry and intact. No rash noted. Psychiatric: Mood and affect are normal. Speech and behavior are normal.  ____________________________________________   LABS (all labs ordered are listed, but only abnormal results are displayed)  Results for orders placed or performed during the hospital encounter of 09/29/20 (from the past 24 hour(s))  Lipase, blood     Status: None   Collection Time: 09/29/20  2:22 PM  Result Value Ref Range   Lipase 28 11 - 51 U/L  Comprehensive metabolic panel     Status: Abnormal   Collection Time: 09/29/20  2:22 PM  Result Value Ref Range   Sodium 140 135 - 145 mmol/L   Potassium 4.3 3.5 - 5.1 mmol/L   Chloride 102 98 - 111 mmol/L   CO2 24 22 - 32 mmol/L   Glucose, Bld 98 70 - 99 mg/dL   BUN 9 6 - 20 mg/dL   Creatinine, Ser 0.33 (L) 0.44 - 1.00 mg/dL   Calcium 9.2 8.9 - 10.3 mg/dL   Total Protein 8.2 (H) 6.5 - 8.1 g/dL   Albumin 4.5 3.5 - 5.0 g/dL   AST 495 (H) 15 - 41 U/L   ALT 1,220 (H) 0 - 44 U/L   Alkaline Phosphatase 489 (H) 38 - 126 U/L   Total Bilirubin 7.1 (H) 0.3 - 1.2 mg/dL   GFR, Estimated >60 >60 mL/min   Anion gap 14 5 - 15  CBC     Status: Abnormal   Collection Time: 09/29/20  2:22 PM  Result Value Ref Range   WBC 7.5 4.0 - 10.5 K/uL   RBC 4.18 3.87  - 5.11 MIL/uL   Hemoglobin 12.9 12.0 - 15.0 g/dL   HCT 38.8 36.0 - 46.0 %   MCV 92.8 80.0 - 100.0 fL   MCH 30.9 26.0 - 34.0 pg   MCHC 33.2 30.0 - 36.0 g/dL   RDW 12.5 11.5 - 15.5 %   Platelets 146 (L) 150 - 400 K/uL   nRBC 0.0 0.0 - 0.2 %   ____________________________________________ ____________________________________________  RADIOLOGY  I personally reviewed all radiographic images ordered to evaluate for the above acute complaints and reviewed radiology reports and findings.  These findings were personally discussed with the patient.  Please  see medical record for radiology report.  ____________________________________________   PROCEDURES  Procedure(s) performed:  Procedures    Critical Care performed: no ____________________________________________   INITIAL IMPRESSION / ASSESSMENT AND PLAN / ED COURSE  Pertinent labs & imaging results that were available during my care of the patient were reviewed by me and considered in my medical decision making (see chart for details).   DDX: cholelithiasis, choledocholithiais, pancreatitis, enteritis, gastritis  Tina Wilson is a 35 y.o. who presents to the ED with presentation as described above. Patient nontoxic-appearing but somewhat uncomfortable. Blood work does show evidence of obstructive pattern with elevated LFTs and bilirubin of 7. No white count. She does not have any peritonitis. Ultrasound without evidence of cholecystitis but does show findings consistent with cholelithiasis and probable choledocholithiasis. Discussed case in consultation with general surgery, Dr.Cannon. Will admit to medicine, order MRCP and GI consultation for further medical management.  Have discussed with the patient and available family all diagnostics and treatments performed thus far and all questions were answered to the best of my ability. The patient demonstrates understanding and agreement with plan.      The patient was  evaluated in Emergency Department today for the symptoms described in the history of present illness. He/she was evaluated in the context of the global COVID-19 pandemic, which necessitated consideration that the patient might be at risk for infection with the SARS-CoV-2 virus that causes COVID-19. Institutional protocols and algorithms that pertain to the evaluation of patients at risk for COVID-19 are in a state of rapid change based on information released by regulatory bodies including the CDC and federal and state organizations. These policies and algorithms were followed during the patient's care in the ED.  As part of my medical decision making, I reviewed the following data within the [place] notes reviewed and incorporated, Labs reviewed, notes from prior ED visits and [place] Controlled Substance Database   ____________________________________________   FINAL CLINICAL IMPRESSION(S) / ED DIAGNOSES  Final diagnoses:  RUQ abdominal pain  Choledocholithiasis      NEW MEDICATIONS STARTED DURING THIS VISIT:  New Prescriptions   No medications on file     Note:  This document was prepared using Dragon voice recognition software and may include unintentional dictation errors.    Merlyn Lot, MD 09/30/20 [phone]

## 2020-09-30 NOTE — ED Notes (Signed)
MRI intercepted patient prior to patient arriving in ED bed and took patient to MRI.

## 2020-09-30 NOTE — ED Notes (Signed)
Report provided to [facility] in Endo. Per Magda Paganini test is being delayed.

## 2020-09-30 NOTE — ED Notes (Signed)
[facility] continues to not work Therapist, nutritional. Blood work collected and sent to lab with patient label stickers.

## 2020-09-30 NOTE — H&P (Signed)
[place]   PATIENT NAME: Tina Wilson    MR#:  [id number]  DATE OF BIRTH:  07-Roxane Puerto-1987  DATE OF ADMISSION:  09/30/2020  PRIMARY CARE PHYSICIAN: Lorrene Reid, PA-C   REQUESTING/REFERRING PHYSICIAN: Merlyn Lot, MD  CHIEF COMPLAINT:   Chief Complaint  Patient presents with  . Abdominal Pain    HISTORY OF PRESENT ILLNESS:  Tina Wilson  is a 34 y.o. Caucasian female with a known history of asthma, and anxiety and depression and migraine, who presented to the emergency room with acute onset of right upper back pain that felt like stabbing knife about 7 to 8 days ago and has been intermittent since then with associated nausea and vomiting.  Her pain then radiated all around her chest.  She had a negative outpatient pregnancy test.  She admits to current significant jaundice.  She was having mild dyspnea one day with associated chest tightness and 1 CEN and thought to have a panic attack.  She was then referred to her primary care physician who ordered a right upper quadrant ultrasound which showed cholelithiasis without definite sonographic signs of cholecystitis.  It also showed mild biliary ductal dilatation with common bile duct measuring 7 mm.  The patient denies any fever or chills since currently but stated that she had chills couple of nights ago.  No diarrhea.  No bilious vomitus or hematemesis.  No melena or bright red bleeding per rectum.  Upon presentation to the ER vital signs were within normal.  Labs revealed unremarkable BMP.  LFTs however showed alk phos of 489 and AST 495 with ALT of 1220 total protein of 8.2 and albumin 4.5.  CBC was unremarkable except for thrombocytopenia of 146.  COVID-19 PCR and influenza antigens came back negative.  MRCP showed the following: 1. Choledocholithiasis with mild dilatation of the common bile duct. 2. Cholelithiasis without signal changes of acute cholecystitis.  The patient was given 50 mg of IV Toradol, 4  mg of IV Zofran and 4 mg of IV morphine sulfate.  Dr. Allen Norris was contacted about the patient and is aware.  Dr. Celine Ahr was contacted about the patient and is aware.  The patient will be admitted to a medical monitored bed for further evaluation and management.  PAST MEDICAL HISTORY:   Past Medical History:  Diagnosis Date  . Allergy   . Anxiety   . Asthma   . Depression   . Herpes genitalis in women 2010  . Migraine   . Thrombocytopenic disorder ([place])     PAST SURGICAL HISTORY:   Past Surgical History:  Procedure Laterality Date  . CESAREAN SECTION N/A 12/01/2019   Procedure: CESAREAN SECTION;  Surgeon: Everlene Farrier, MD;  Location: [facility];  Service: Obstetrics;  Laterality: N/A;  . NO PAST SURGERIES      SOCIAL HISTORY:   Social History   Tobacco Use  . Smoking status: Never Smoker  . Smokeless tobacco: Never Used  Substance Use Topics  . Alcohol use: Yes    Alcohol/week: 14.0 standard drinks    Types: 10 Glasses of wine, 4 Standard drinks or equivalent per week    FAMILY HISTORY:   Family History  Problem Relation Age of Onset  . Alcohol abuse Maternal Grandmother   . Breast cancer Paternal Grandmother   . Alcohol abuse Paternal Grandfather   . Arthritis Mother   . Depression Mother   . Hyperlipidemia Father   . Hypertension Father   . Alcohol  abuse Paternal Aunt   . Heart attack Maternal Grandfather   . Stroke Maternal Grandfather   . Cancer Neg Hx   . COPD Neg Hx   . Diabetes Neg Hx   . Drug abuse Neg Hx   . Early death Neg Hx   . Heart disease Neg Hx   . Kidney disease Neg Hx     DRUG ALLERGIES:  No Known Allergies  REVIEW OF SYSTEMS:   ROS As per history of present illness. All pertinent systems were reviewed above. Constitutional, HEENT, cardiovascular, respiratory, GI, GU, musculoskeletal, neuro, psychiatric, endocrine, integumentary and hematologic systems were reviewed and are otherwise negative/unremarkable except for positive findings  mentioned above in the HPI.   MEDICATIONS AT HOME:   Prior to Admission medications   Medication Sig Start Date End Date Taking? Authorizing Provider  FLUoxetine (PROZAC) 20 MG tablet Take 0.5 (88m) tablet once daily for 7 days. Then take 1 tablet once daily. 09/28/20  Yes Abonza, Maritza, PA-C  naproxen (NAPROSYN) 500 MG tablet Take 1 tablet (500 mg total) by mouth 2 (two) times daily. 09/27/20  Yes Wieters, Hallie C, PA-C  ondansetron (ZOFRAN ODT) 4 MG disintegrating tablet Take 1 tablet (4 mg total) by mouth every 8 (eight) hours as needed for nausea or vomiting. 09/27/20  Yes Wieters, Hallie C, PA-C  tiZANidine (ZANAFLEX) 4 MG tablet Take 1 tablet (4 mg total) by mouth every 6 (six) hours as needed for muscle spasms. 09/27/20  Yes Wieters, Hallie C, PA-C  traMADol (ULTRAM) 50 MG tablet Take 1 tablet (50 mg total) by mouth every 6 (six) hours as needed for severe pain. 09/27/20  Yes Wieters, Hallie C, PA-C      VITAL SIGNS:  Blood pressure (!) 142/81, pulse 78, temperature 98 F (36.7 C), temperature source Oral, resp. rate 18, height 5' 2" (1.575 m), weight 74.8 kg, last menstrual period 09/29/2020, SpO2 99 %, currently breastfeeding.  PHYSICAL EXAMINATION:  Physical Exam  GENERAL:  35y.o.-year-old Caucasian female patient lying in the bed with no acute distress.  EYES: Pupils equal, round, reactive to light and accommodation. No scleral icterus. Extraocular muscles intact.  HEENT: Head atraumatic, normocephalic. Oropharynx and nasopharynx clear.  NECK:  Supple, no jugular venous distention. No thyroid enlargement, no tenderness.  LUNGS: Normal breath sounds bilaterally, no wheezing, rales,rhonchi or crepitation. No use of accessory muscles of respiration.  CARDIOVASCULAR: Regular rate and rhythm, S1, S2 normal. No murmurs, rubs, or gallops.  ABDOMEN: Soft, nondistended with right upper quadrant tenderness without rebound tenderness guarding or rigidity.  Negative Murphy sign.. Bowel  sounds present. No organomegaly or mass.  EXTREMITIES: No pedal edema, cyanosis, or clubbing.  NEUROLOGIC: Cranial nerves II through XII are intact. Muscle strength 5/5 in all extremities. Sensation intact. Gait not checked.  PSYCHIATRIC: The patient is alert and oriented x 3.  Normal affect and good eye contact. SKIN: No obvious rash, lesion, or ulcer.   LABORATORY PANEL:   CBC Recent Labs  Lab 09/30/20 0435  WBC 6.6  HGB 11.5*  HCT 35.1*  PLT 124*   ------------------------------------------------------------------------------------------------------------------  Chemistries  Recent Labs  Lab 09/30/20 0435  NA 138  K 3.9  CL 105  CO2 22  GLUCOSE 73  BUN 8  CREATININE 0.36*  CALCIUM 8.2*  AST 419*  ALT 1,076*  ALKPHOS 467*  BILITOT 7.1*   ------------------------------------------------------------------------------------------------------------------  Cardiac Enzymes No results for input(s): TROPONINI in the last 168 hours. ------------------------------------------------------------------------------------------------------------------  RADIOLOGY:  MR ABDOMEN MRCP WO CONTRAST  Result Date: 09/30/2020 CLINICAL DATA:  Right upper quadrant pain and vomiting EXAM: MRI ABDOMEN WITHOUT CONTRAST  (INCLUDING MRCP) TECHNIQUE: Multiplanar multisequence MR imaging of the abdomen was performed. Heavily T2-weighted images of the biliary and pancreatic ducts were obtained, and three-dimensional MRCP images were rendered by post processing. COMPARISON:  None. FINDINGS: Lower chest: No acute findings. Hepatobiliary: No mass or other parenchymal abnormality identified. The common bile duct is dilated measuring 7 mm in diameter. There are multiple stones within the gallbladder. There is no signal abnormality of the gallbladder fossa. There is a stone within the distal common bile duct at the level of the ampulla. Pancreas: No mass, inflammatory changes, or other parenchymal abnormality  identified. Spleen:  Within normal limits in size and appearance. Adrenals/Urinary Tract: No masses identified. No evidence of hydronephrosis. Stomach/Bowel: Visualized portions within the abdomen are unremarkable. Vascular/Lymphatic: No pathologically enlarged lymph nodes identified. No abdominal aortic aneurysm demonstrated. Other:  None. Musculoskeletal: No suspicious bone lesions identified. IMPRESSION: 1. Choledocholithiasis with mild dilatation of the common bile duct. 2. Cholelithiasis without signal changes of acute cholecystitis. Electronically Signed   By: Ulyses Jarred M.D.   On: 09/30/2020 03:03   MR 3D Recon At Scanner  Result Date: 09/30/2020 CLINICAL DATA:  Right upper quadrant pain and vomiting EXAM: MRI ABDOMEN WITHOUT CONTRAST  (INCLUDING MRCP) TECHNIQUE: Multiplanar multisequence MR imaging of the abdomen was performed. Heavily T2-weighted images of the biliary and pancreatic ducts were obtained, and three-dimensional MRCP images were rendered by post processing. COMPARISON:  None. FINDINGS: Lower chest: No acute findings. Hepatobiliary: No mass or other parenchymal abnormality identified. The common bile duct is dilated measuring 7 mm in diameter. There are multiple stones within the gallbladder. There is no signal abnormality of the gallbladder fossa. There is a stone within the distal common bile duct at the level of the ampulla. Pancreas: No mass, inflammatory changes, or other parenchymal abnormality identified. Spleen:  Within normal limits in size and appearance. Adrenals/Urinary Tract: No masses identified. No evidence of hydronephrosis. Stomach/Bowel: Visualized portions within the abdomen are unremarkable. Vascular/Lymphatic: No pathologically enlarged lymph nodes identified. No abdominal aortic aneurysm demonstrated. Other:  None. Musculoskeletal: No suspicious bone lesions identified. IMPRESSION: 1. Choledocholithiasis with mild dilatation of the common bile duct. 2. Cholelithiasis  without signal changes of acute cholecystitis. Electronically Signed   By: Ulyses Jarred M.D.   On: 09/30/2020 03:03   US Abdomen Limited RUQ (LIVER/GB)  Result Date: 09/29/2020 CLINICAL DATA:  Right upper quadrant pain for 6 days. EXAM: ULTRASOUND ABDOMEN LIMITED RIGHT UPPER QUADRANT COMPARISON:  None. FINDINGS: Gallbladder: Numerous small gallstones are seen measuring up to 5 mm. No evidence of gallbladder wall thickening or pericholecystic fluid. Common bile duct: Diameter: 7 mm, which is mildly dilated. Probable mild dilatation of intrahepatic bile ducts also noted. Liver: No focal lesion identified. Within normal limits in parenchymal echogenicity. Portal vein is patent on color Doppler imaging with normal direction of blood flow towards the liver. Other: None. IMPRESSION: Cholelithiasis, without definite sonographic signs of cholecystitis. Mild biliary ductal dilatation, with common bile duct measuring 7 mm. Suggest correlation with liver function tests, and consider abdomen MRI/MRCP without and with contrast for further evaluation if clinically warranted. Electronically Signed   By: Marlaine Hind M.D.   On: 09/29/2020 09:14      IMPRESSION AND PLAN:   1.  Choledocholithiasis with subsequent obstructive jaundice and right upper quadrant abdominal pain. - The patient will be admitted to a medically monitored bed. - The  patient will be kept NPO. - She will be hydrated with IV normal saline. - Pain management will be provided. - We will follow his LFTs. - GI consultation will be obtained. - Dr. Allen Norris was notified and is aware about the patient.  2.  Cholelithiasis. - General surgery consult will be obtained. - Dr. Celine Ahr was notified and is aware about the patient.  3.  Depression. - We will continue Prozac.  4.  DVT prophylaxis. -SCDs. - We will hold off medical prophylaxis given thrombocytopenia.  All the records are reviewed and case discussed with ED provider. The plan of care  was discussed in details with the patient (and family). I answered all questions. The patient agreed to proceed with the above mentioned plan. Further management will depend upon hospital course.   CODE STATUS: Full code  Status is: Inpatient  Remains inpatient appropriate because:Ongoing active pain requiring inpatient pain management, Ongoing diagnostic testing needed not appropriate for outpatient work up, Unsafe d/c plan, IV treatments appropriate due to intensity of illness or inability to take PO and Inpatient level of care appropriate due to severity of illness   Dispo: The patient is from: Home              Anticipated d/c is to: Home              Anticipated d/c date is: 3 days              Patient currently is not medically stable to d/c.      TOTAL TIME TAKING CARE OF THIS PATIENT: 55 minutes.    Christel Mormon M.D on 09/30/2020 at 6:33 AM  [facility]   From 7 PM-7 AM, contact night-coverage [email or web address]  CC: Primary care physician; Lorrene Reid, PA-C

## 2020-09-30 NOTE — Op Note (Signed)
[facility] Patient Name: Tina Wilson [facility] Procedure Date: 09/30/2020 1:51 PM MRN: [id number] Account #: [id number] Date of Birth: May 27, 1986 Admit Type: Inpatient Age: 35 Room: [facility] ENDO ROOM 4 Gender: Female Note Status: Finalized Procedure:             ERCP Indications:           Bile duct stone(s), Elevated liver enzymes Providers:             Lucilla Lame MD, MD Referring MD:          Lorrene Reid (Referring MD) Medicines:             Propofol per Anesthesia Complications:         No immediate complications. Procedure:             Pre-Anesthesia Assessment:                        - Prior to the procedure, a History and Physical was                         performed, and patient medications and allergies were                         reviewed. The patient's tolerance of previous                         anesthesia was also reviewed. The risks and benefits                         of the procedure and the sedation options and risks                         were discussed with the patient. All questions were                         answered, and informed consent was obtained. Prior                         Anticoagulants: The patient has taken no previous                         anticoagulant or antiplatelet agents. ASA Grade                         Assessment: I - A normal, healthy patient. After                         reviewing the risks and benefits, the patient was                         deemed in satisfactory condition to undergo the                         procedure.                        After obtaining informed consent, the scope was passed  under direct vision. Throughout the procedure, the                         patient's blood pressure, pulse, and oxygen                         saturations were monitored continuously. The was                         introduced through the mouth, and used to inject                          contrast into and used to inject contrast into the                         bile duct. The ERCP was accomplished without                         difficulty. The patient tolerated the procedure well. Findings:      The scout film was normal. The esophagus was successfully intubated       under direct vision. The scope was advanced to a normal major papilla in       the descending duodenum without detailed examination of the pharynx,       larynx and associated structures, and upper GI tract. The upper GI tract       was grossly normal. The bile duct was deeply cannulated with the       short-nosed traction sphincterotome. Contrast was injected. I personally       interpreted the bile duct images. There was brisk flow of contrast       through the ducts. Image quality was excellent. Contrast extended to the       entire biliary tree. A wire was passed into the biliary tree. A 5 mm       biliary sphincterotomy was made with a traction (standard)       sphincterotome using ERBE electrocautery. There was no       post-sphincterotomy bleeding. The biliary tree was swept with a 15 mm       balloon starting at the bifurcation. Pus was swept from the duct. Three       stones were removed. No stones remained. Duodinitis and erosions Impression:            - Choledocholithiasis was found. Complete removal was                         accomplished by biliary sphincterotomy and balloon                         extraction.                        - A biliary sphincterotomy was performed.                        - The biliary tree was swept and pus was found.                        - Doudenitis Recommendation:        - Return patient to hospital ward  for ongoing care.                        - Clear liquid diet.                        - Continue present medications.                        - Watch for pancreatitis, bleeding, perforation, and                         cholangitis.                         - Surgical consultation for consideration of                         cholecystectomy. Procedure Code(s):     --- Professional ---                        [phone], Endoscopic retrograde cholangiopancreatography                         (ERCP); with removal of calculi/debris from                         biliary/pancreatic duct(s)                        [id number], Endoscopic retrograde cholangiopancreatography                         (ERCP); with sphincterotomy/papillotomy                        [phone], Endoscopic catheterization of the biliary                         ductal system, radiological supervision and                         interpretation Diagnosis Code(s):     --- Professional ---                        K80.50, Calculus of bile duct without cholangitis or                         cholecystitis without obstruction                        R74.8, Abnormal levels of other serum enzymes CPT copyright 2019 American Medical Association. All rights reserved. The codes documented in this report are preliminary and upon coder review may  be revised to meet current compliance requirements. Lucilla Lame MD, MD 09/30/2020 3:34:47 PM This report has been signed electronically. Number of Addenda: 0 Note Initiated On: 09/30/2020 1:51 PM Estimated Blood Loss:  Estimated blood loss: none.      [facility]

## 2020-09-30 NOTE — ED Notes (Signed)
Patient returned from MRI.

## 2020-09-30 NOTE — ED Notes (Signed)
Attending provider at bedside

## 2020-09-30 NOTE — Anesthesia Preprocedure Evaluation (Signed)
Anesthesia Evaluation  Patient identified by MRN, date of birth, ID band Patient awake    Reviewed: Allergy & Precautions, NPO status , Patient's Chart, lab work & pertinent test results  History of Anesthesia Complications Negative for: history of anesthetic complications  Airway Mallampati: II       Dental   Pulmonary asthma (no meds) , neg sleep apnea, neg COPD, Not current smoker,           Cardiovascular (-) hypertension(-) Past MI and (-) CHF (-) dysrhythmias (-) Valvular Problems/Murmurs     Neuro/Psych neg Seizures Anxiety Depression    GI/Hepatic Neg liver ROS, GERD (mild, no meds)  ,  Endo/Other  neg diabetes  Renal/GU negative Renal ROS     Musculoskeletal   Abdominal   Peds  Hematology   Anesthesia Other Findings   Reproductive/Obstetrics                             Anesthesia Physical Anesthesia Plan  ASA: II and emergent  Anesthesia Plan: General   Post-op Pain Management:    Induction: Intravenous  PONV Risk Score and Plan: Propofol infusion and TIVA  Airway Management Planned: Nasal Cannula  Additional Equipment:   Intra-op Plan:   Post-operative Plan:   Informed Consent: I have reviewed the patients History and Physical, chart, labs and discussed the procedure including the risks, benefits and alternatives for the proposed anesthesia with the patient or authorized representative who has indicated his/her understanding and acceptance.       Plan Discussed with:   Anesthesia Plan Comments: (Pt with no vomiting, no reflux symptoms. Will use natural airway with propofol infusion.)        Anesthesia Quick Evaluation

## 2020-09-30 NOTE — Transfer of Care (Signed)
Immediate Anesthesia Transfer of Care Note  Patient: Tina Wilson  Procedure(s) Performed: ENDOSCOPIC RETROGRADE CHOLANGIOPANCREATOGRAPHY (ERCP) WITH PROPOFOL (N/A )  Patient Location: PACU and Endoscopy Unit  Anesthesia Type:General  Level of Consciousness: awake, alert  and oriented  Airway & Oxygen Therapy: Patient Spontanous Breathing and Patient connected to nasal cannula oxygen  Post-op Assessment: Report given to RN and Post -op Vital signs reviewed and stable  Post vital signs: Reviewed and stable  Last Vitals:  Vitals Value Taken Time  BP    Temp    Pulse 71 09/30/20 1547  Resp 10 09/30/20 1547  SpO2 100 % 09/30/20 1547  Vitals shown include unvalidated device data.  Last Pain:  Vitals:   09/30/20 1541  TempSrc: Temporal  PainSc: 0-No pain         Complications: No complications documented.

## 2020-09-30 NOTE — Anesthesia Postprocedure Evaluation (Signed)
Anesthesia Post Note  Patient: Tina Wilson  Procedure(s) Performed: ENDOSCOPIC RETROGRADE CHOLANGIOPANCREATOGRAPHY (ERCP) WITH PROPOFOL (N/A )  Patient location during evaluation: Endoscopy Anesthesia Type: General Level of consciousness: awake and alert Pain management: pain level controlled Vital Signs Assessment: post-procedure vital signs reviewed and stable Respiratory status: spontaneous breathing and respiratory function stable Cardiovascular status: stable Anesthetic complications: no   No complications documented.   Last Vitals:  Vitals:   09/30/20 1611 09/30/20 1631  BP: 124/76 125/81  Pulse: 68 68  Resp: 15 16  Temp:  36.5 C  SpO2: 100% 100%    Last Pain:  Vitals:   09/30/20 1631  TempSrc: Oral  PainSc:                  [name] K

## 2020-09-30 NOTE — Progress Notes (Signed)
PROGRESS NOTE    Tina Wilson  [id number] DOB: 04/02/1986 DOA: 09/30/2020 PCP: Lorrene Reid, PA-C   Brief Narrative: Taken from H&P Tina Wilson  is a 35 y.o. Caucasian female with a known history of asthma, and anxiety and depression and migraine, who presented to the emergency room with acute onset of right upper back pain that felt like stabbing knife about 7 to 8 days ago and has been intermittent since then with associated nausea and vomiting. She was found to have markedly elevated liver enzymes and T bili.  Ultrasound with cholelithiasis, no sonographic sign of cholecystitis.  Bile duct dilatation and concern for obstructive jaundice. GI and general surgery was consulted. Patient had MRCP which shows obstructing stone in ampulla, going for ERCP later today followed by cholecystectomy tomorrow morning.  Subjective: Patient is feeling little better when seen today.  Pain was controlled with morphine.  Waiting for her ERCP.  Husband at bedside.  Assessment & Plan:   Active Problems:   Choledocholithiasis  Obstructive jaundice secondary to obstructive choledocholithiasis. Elevated liver enzymes and T bili.  ERCP with concern of obstructive gallstone in ampulla.  Going for ERCP later today. Surgery schedule for tomorrow. Pain  well controlled with morphine. -Hepatitis panel pending. -Continue with pain management.  History of depression.  No acute concern. -Continue with home dose of Prozac  Objective: Vitals:   09/30/20 0043 09/30/20 0340 09/30/20 0811 09/30/20 1400  BP: 133/79 (!) 142/81 118/80 124/70  Pulse: 87 78 63 75  Resp: 16 18 18 17   Temp: 98 F (36.7 C)     TempSrc: Oral     SpO2: 100% 99% 98% 100%  Weight:      Height:        Intake/Output Summary (Last 24 hours) at 09/30/2020 1412 Last data filed at 09/30/2020 1051 Gross per 24 hour  Intake 604.95 ml  Output --  Net 604.95 ml   Filed Weights   09/29/20 1418  Weight: 74.8 kg     Examination:  General exam: Appears calm and comfortable  Respiratory system: Clear to auscultation. Respiratory effort normal. Cardiovascular system: S1 & S2 heard, RRR.  Gastrointestinal system: Soft, nontender, nondistended, bowel sounds positive. Central nervous system: Alert and oriented. No focal neurological deficits.Symmetric 5 x 5 power. Extremities: No edema, no cyanosis, pulses intact and symmetrical. Skin: No rashes, lesions or ulcers Psychiatry: Judgement and insight appear normal. Mood & affect appropriate.   DVT prophylaxis: SCDs.  Patient has thrombocytopenia Code Status: Full Family Communication: Husband was updated at bedside. Disposition Plan:  Status is: Inpatient  Remains inpatient appropriate because:Inpatient level of care appropriate due to severity of illness   Dispo: The patient is from: Home              Anticipated d/c is to: Home              Anticipated d/c date is: 2 days              Patient currently is not medically stable to d/c.   Consultants:   GI  General surgery  Procedures:  Antimicrobials:   Data Reviewed: I have personally reviewed following labs and imaging studies  CBC: Recent Labs  Lab 09/26/20 2131 09/29/20 1422 09/30/20 0435  WBC 12.3* 7.5 6.6  HGB 11.7* 12.9 11.5*  HCT 35.8* 38.8 35.1*  MCV 93.2 92.8 93.9  PLT 133* 146* [id number]*   Basic Metabolic Panel: Recent Labs  Lab 09/26/20 2131 09/29/20 1422  09/30/20 0435  NA 138 140 138  K 4.0 4.3 3.9  CL 104 102 105  CO2 23 24 22   GLUCOSE 104* 98 73  BUN 10 9 8   CREATININE 0.46 0.33* 0.36*  CALCIUM 9.2 9.2 8.2*   GFR: Estimated Creatinine Clearance: 94.7 mL/min (A) (by C-G formula based on SCr of 0.36 mg/dL (L)). Liver Function Tests: Recent Labs  Lab 09/26/20 2131 09/29/20 1422 09/30/20 0435  AST 64* 495* 419*  ALT 48* 1,220* 1,076*  ALKPHOS 111 489* 467*  BILITOT 0.7 7.1* 7.1*  PROT 7.5 8.2* 7.2  ALBUMIN 4.3 4.5 3.8   Recent Labs  Lab  09/26/20 2131 09/29/20 1422  LIPASE 28 28   No results for input(s): AMMONIA in the last 168 hours. Coagulation Profile: No results for input(s): INR, PROTIME in the last 168 hours. Cardiac Enzymes: No results for input(s): CKTOTAL, CKMB, CKMBINDEX, TROPONINI in the last 168 hours. BNP (last 3 results) No results for input(s): PROBNP in the last 8760 hours. HbA1C: No results for input(s): HGBA1C in the last 72 hours. CBG: No results for input(s): GLUCAP in the last 168 hours. Lipid Profile: No results for input(s): CHOL, HDL, LDLCALC, TRIG, CHOLHDL, LDLDIRECT in the last 72 hours. Thyroid Function Tests: No results for input(s): TSH, T4TOTAL, FREET4, T3FREE, THYROIDAB in the last 72 hours. Anemia Panel: No results for input(s): VITAMINB12, FOLATE, FERRITIN, TIBC, IRON, RETICCTPCT in the last 72 hours. Sepsis Labs: No results for input(s): PROCALCITON, LATICACIDVEN in the last 168 hours.  Recent Results (from the past 240 hour(s))  Resp Panel by RT-PCR (Flu A&B, Covid) Nasopharyngeal Swab     Status: None   Collection Time: 09/30/20  3:23 AM   Specimen: Nasopharyngeal Swab; Nasopharyngeal(NP) swabs in vial transport medium  Result Value Ref Range Status   SARS Coronavirus 2 by RT PCR NEGATIVE NEGATIVE Final    Comment: (NOTE) SARS-CoV-2 target nucleic acids are NOT DETECTED.  The SARS-CoV-2 RNA is generally detectable in upper respiratory specimens during the acute phase of infection. The lowest concentration of SARS-CoV-2 viral copies this assay can detect is 138 copies/mL. A negative result does not preclude SARS-Cov-2 infection and should not be used as the sole basis for treatment or other patient management decisions. A negative result may occur with  improper specimen collection/handling, submission of specimen other than nasopharyngeal swab, presence of viral mutation(s) within the areas targeted by this assay, and inadequate number of viral copies(<138 copies/mL). A  negative result must be combined with clinical observations, patient history, and epidemiological information. The expected result is Negative.  Fact Sheet for Patients:  EntrepreneurPulse.com.au  Fact Sheet for Healthcare Providers:  IncredibleEmployment.be  This test is no t yet approved or cleared by the [place] FDA and  has been authorized for detection and/or diagnosis of SARS-CoV-2 by FDA under an Emergency Use Authorization (EUA). This EUA will remain  in effect (meaning this test can be used) for the duration of the COVID-19 declaration under Section 564(b)(1) of the Act, 21 U.S.C.section 360bbb-3(b)(1), unless the authorization is terminated  or revoked sooner.       Influenza A by PCR NEGATIVE NEGATIVE Final   Influenza B by PCR NEGATIVE NEGATIVE Final    Comment: (NOTE) The Xpert Xpress SARS-CoV-2/FLU/RSV plus assay is intended as an aid in the diagnosis of influenza from Nasopharyngeal swab specimens and should not be used as a sole basis for treatment. Nasal washings and aspirates are unacceptable for Xpert Xpress SARS-CoV-2/FLU/RSV testing.  Fact Sheet  for Patients: EntrepreneurPulse.com.au  Fact Sheet for Healthcare Providers: IncredibleEmployment.be  This test is not yet approved or cleared by the [place] FDA and has been authorized for detection and/or diagnosis of SARS-CoV-2 by FDA under an Emergency Use Authorization (EUA). This EUA will remain in effect (meaning this test can be used) for the duration of the COVID-19 declaration under Section 564(b)(1) of the Act, 21 U.S.C. section 360bbb-3(b)(1), unless the authorization is terminated or revoked.  Performed at [facility], [address] [place], [place] [address]      Radiology Studies: MR ABDOMEN MRCP WO CONTRAST  Result Date: 09/30/2020 CLINICAL DATA:  Right upper quadrant pain and vomiting EXAM: MRI  ABDOMEN WITHOUT CONTRAST  (INCLUDING MRCP) TECHNIQUE: Multiplanar multisequence MR imaging of the abdomen was performed. Heavily T2-weighted images of the biliary and pancreatic ducts were obtained, and three-dimensional MRCP images were rendered by post processing. COMPARISON:  None. FINDINGS: Lower chest: No acute findings. Hepatobiliary: No mass or other parenchymal abnormality identified. The common bile duct is dilated measuring 7 mm in diameter. There are multiple stones within the gallbladder. There is no signal abnormality of the gallbladder fossa. There is a stone within the distal common bile duct at the level of the ampulla. Pancreas: No mass, inflammatory changes, or other parenchymal abnormality identified. Spleen:  Within normal limits in size and appearance. Adrenals/Urinary Tract: No masses identified. No evidence of hydronephrosis. Stomach/Bowel: Visualized portions within the abdomen are unremarkable. Vascular/Lymphatic: No pathologically enlarged lymph nodes identified. No abdominal aortic aneurysm demonstrated. Other:  None. Musculoskeletal: No suspicious bone lesions identified. IMPRESSION: 1. Choledocholithiasis with mild dilatation of the common bile duct. 2. Cholelithiasis without signal changes of acute cholecystitis. Electronically Signed   By: Ulyses Jarred M.D.   On: 09/30/2020 03:03   MR 3D Recon At Scanner  Result Date: 09/30/2020 CLINICAL DATA:  Right upper quadrant pain and vomiting EXAM: MRI ABDOMEN WITHOUT CONTRAST  (INCLUDING MRCP) TECHNIQUE: Multiplanar multisequence MR imaging of the abdomen was performed. Heavily T2-weighted images of the biliary and pancreatic ducts were obtained, and three-dimensional MRCP images were rendered by post processing. COMPARISON:  None. FINDINGS: Lower chest: No acute findings. Hepatobiliary: No mass or other parenchymal abnormality identified. The common bile duct is dilated measuring 7 mm in diameter. There are multiple stones within the  gallbladder. There is no signal abnormality of the gallbladder fossa. There is a stone within the distal common bile duct at the level of the ampulla. Pancreas: No mass, inflammatory changes, or other parenchymal abnormality identified. Spleen:  Within normal limits in size and appearance. Adrenals/Urinary Tract: No masses identified. No evidence of hydronephrosis. Stomach/Bowel: Visualized portions within the abdomen are unremarkable. Vascular/Lymphatic: No pathologically enlarged lymph nodes identified. No abdominal aortic aneurysm demonstrated. Other:  None. Musculoskeletal: No suspicious bone lesions identified. IMPRESSION: 1. Choledocholithiasis with mild dilatation of the common bile duct. 2. Cholelithiasis without signal changes of acute cholecystitis. Electronically Signed   By: Ulyses Jarred M.D.   On: 09/30/2020 03:03   US Abdomen Limited RUQ (LIVER/GB)  Result Date: 09/29/2020 CLINICAL DATA:  Right upper quadrant pain for 6 days. EXAM: ULTRASOUND ABDOMEN LIMITED RIGHT UPPER QUADRANT COMPARISON:  None. FINDINGS: Gallbladder: Numerous small gallstones are seen measuring up to 5 mm. No evidence of gallbladder wall thickening or pericholecystic fluid. Common bile duct: Diameter: 7 mm, which is mildly dilated. Probable mild dilatation of intrahepatic bile ducts also noted. Liver: No focal lesion identified. Within normal limits in parenchymal echogenicity. Portal vein is patent  on color Doppler imaging with normal direction of blood flow towards the liver. Other: None. IMPRESSION: Cholelithiasis, without definite sonographic signs of cholecystitis. Mild biliary ductal dilatation, with common bile duct measuring 7 mm. Suggest correlation with liver function tests, and consider abdomen MRI/MRCP without and with contrast for further evaluation if clinically warranted. Electronically Signed   By: Marlaine Hind M.D.   On: 09/29/2020 09:14    Scheduled Meds: . FLUoxetine  10 mg Oral Daily  . [START ON  10/01/2020] indocyanine green  7.5 mg Intravenous Once  . indomethacin  100 mg Rectal Once   Continuous Infusions: . sodium chloride 100 mL/hr at 09/30/20 1051  . [START ON 10/01/2020] cefoTEtan (CEFOTAN) IV    . lactated ringers       LOS: 0 days   Time spent: 35 minutes.  Lorella Nimrod, MD [facility]  If 7PM-7AM, please contact night-coverage [email or web address]  09/30/2020, 2:12 PM   This record has been created using Systems analyst. Errors have been sought and corrected,but may not always be located. Such creation errors do not reflect on the standard of care.

## 2020-09-30 NOTE — ED Notes (Signed)
Magda Paganini RN here to transport patient to OR. Report given to [facility]

## 2020-09-30 NOTE — ED Notes (Signed)
Pt ambulatory to toilet with steady gait noted.  

## 2020-09-30 NOTE — Consult Note (Signed)
Reason for Consult: Choledocholithiasis Referring Physician: Merlyn Lot, MD (emergency medicine)  Tina Wilson is an 35 y.o. female.  HPI: She presented to the emergency department yesterday after a 6-day history of right upper quadrant and (primarily) back pain.  She had sought care several times during the course of the symptoms.  She was seen at the [facility] emergency department on January 11 and January 12.  She initially thought she had strained a muscle lifting her overweight dog.  She did have labs that showed mild elevation in her transaminases and a leukocytosis, but she was sent home.  She communicated with her primary care provider on January 13 and a right upper quadrant ultrasound was ordered.  Cholelithiasis was seen, as well as mild bile duct dilatation.  No signs of cholecystitis were appreciated.  She was told to return to the emergency department and presented at [facility] overnight.  Labs done in the emergency department demonstrated a marked elevation in her bilirubin at 7.1, as well as significant increase in her transaminases.  Leukocytosis had resolved.  General surgery was contacted for further evaluation and management..  Due to the significant concern for choledocholithiasis, I recommended that she undergo MRCP, be admitted to hospital medicine, and that gastroenterology should be consulted.  MRCP was consistent with a stone lodged in the common bile duct at the ampulla.  She is scheduled to undergo ERCP later today.  Past Medical History:  Diagnosis Date  . Allergy   . Anxiety   . Asthma   . Depression   . Herpes genitalis in women 2010  . Migraine   . Thrombocytopenic disorder [facility])     Past Surgical History:  Procedure Laterality Date  . CESAREAN SECTION N/A 12/01/2019   Procedure: CESAREAN SECTION;  Surgeon: Everlene Farrier, MD;  Location: [place] LD ORS;  Service: Obstetrics;  Laterality: N/A;  . NO PAST SURGERIES      Family History   Problem Relation Age of Onset  . Alcohol abuse Maternal Grandmother   . Breast cancer Paternal Grandmother   . Alcohol abuse Paternal Grandfather   . Arthritis Mother   . Depression Mother   . Hyperlipidemia Father   . Hypertension Father   . Alcohol abuse Paternal Aunt   . Heart attack Maternal Grandfather   . Stroke Maternal Grandfather   . Cancer Neg Hx   . COPD Neg Hx   . Diabetes Neg Hx   . Drug abuse Neg Hx   . Early death Neg Hx   . Heart disease Neg Hx   . Kidney disease Neg Hx     Social History:  reports that she has never smoked. She has never used smokeless tobacco. She reports current alcohol use of about 14.0 standard drinks of alcohol per week. She reports that she does not use drugs.  Allergies: No Known Allergies  Medications: I have reviewed the patient's current medications.  Results for orders placed or performed during the hospital encounter of 09/30/20 (from the past 48 hour(s))  Lipase, blood     Status: None   Collection Time: 09/29/20  2:22 PM  Result Value Ref Range   Lipase 28 11 - 51 U/L    Comment: Performed at [facility], [address] [place], [place] [address]  Comprehensive metabolic panel     Status: Abnormal   Collection Time: 09/29/20  2:22 PM  Result Value Ref Range   Sodium 140 135 - 145 mmol/L   Potassium  4.3 3.5 - 5.1 mmol/L   Chloride 102 98 - 111 mmol/L   CO2 24 22 - 32 mmol/L   Glucose, Bld 98 70 - 99 mg/dL    Comment: Glucose reference range applies only to samples taken after fasting for at least 8 hours.   BUN 9 6 - 20 mg/dL   Creatinine, Ser 0.33 (L) 0.44 - 1.00 mg/dL   Calcium 9.2 8.9 - 10.3 mg/dL   Total Protein 8.2 (H) 6.5 - 8.1 g/dL   Albumin 4.5 3.5 - 5.0 g/dL   AST 495 (H) 15 - 41 U/L   ALT 1,220 (H) 0 - 44 U/L   Alkaline Phosphatase 489 (H) 38 - 126 U/L   Total Bilirubin 7.1 (H) 0.3 - 1.2 mg/dL   GFR, Estimated >60 >60 mL/min    Comment: (NOTE) Calculated using the CKD-EPI Creatinine Equation  (2021)    Anion gap 14 5 - 15    Comment: Performed at [facility], [address] [place], [place] [address]  CBC     Status: Abnormal   Collection Time: 09/29/20  2:22 PM  Result Value Ref Range   WBC 7.5 4.0 - 10.5 K/uL   RBC 4.18 3.87 - 5.11 MIL/uL   Hemoglobin 12.9 12.0 - 15.0 g/dL   HCT 38.8 36.0 - 46.0 %   MCV 92.8 80.0 - 100.0 fL   MCH 30.9 26.0 - 34.0 pg   MCHC 33.2 30.0 - 36.0 g/dL   RDW 12.5 11.5 - 15.5 %   Platelets 146 (L) 150 - 400 K/uL   nRBC 0.0 0.0 - 0.2 %    Comment: Performed at [facility], [address] [place], [place] [address]  Resp Panel by RT-PCR (Flu A&B, Covid) Nasopharyngeal Swab     Status: None   Collection Time: 09/30/20  3:23 AM   Specimen: Nasopharyngeal Swab; Nasopharyngeal(NP) swabs in vial transport medium  Result Value Ref Range   SARS Coronavirus 2 by RT PCR NEGATIVE NEGATIVE    Comment: (NOTE) SARS-CoV-2 target nucleic acids are NOT DETECTED.  The SARS-CoV-2 RNA is generally detectable in upper respiratory specimens during the acute phase of infection. The lowest concentration of SARS-CoV-2 viral copies this assay can detect is 138 copies/mL. A negative result does not preclude SARS-Cov-2 infection and should not be used as the sole basis for treatment or other patient management decisions. A negative result may occur with  improper specimen collection/handling, submission of specimen other than nasopharyngeal swab, presence of viral mutation(s) within the areas targeted by this assay, and inadequate number of viral copies(<138 copies/mL). A negative result must be combined with clinical observations, patient history, and epidemiological information. The expected result is Negative.  Fact Sheet for Patients:  EntrepreneurPulse.com.au  Fact Sheet for Healthcare Providers:  IncredibleEmployment.be  This test is no t yet approved or cleared by the [place] FDA and  has  been authorized for detection and/or diagnosis of SARS-CoV-2 by FDA under an Emergency Use Authorization (EUA). This EUA will remain  in effect (meaning this test can be used) for the duration of the COVID-19 declaration under Section 564(b)(1) of the Act, 21 U.S.C.section 360bbb-3(b)(1), unless the authorization is terminated  or revoked sooner.       Influenza A by PCR NEGATIVE NEGATIVE   Influenza B by PCR NEGATIVE NEGATIVE    Comment: (NOTE) The Xpert Xpress SARS-CoV-2/FLU/RSV plus assay is intended as an aid in the diagnosis of influenza from Nasopharyngeal swab specimens and should  not be used as a sole basis for treatment. Nasal washings and aspirates are unacceptable for Xpert Xpress SARS-CoV-2/FLU/RSV testing.  Fact Sheet for Patients: EntrepreneurPulse.com.au  Fact Sheet for Healthcare Providers: IncredibleEmployment.be  This test is not yet approved or cleared by the [place] FDA and has been authorized for detection and/or diagnosis of SARS-CoV-2 by FDA under an Emergency Use Authorization (EUA). This EUA will remain in effect (meaning this test can be used) for the duration of the COVID-19 declaration under Section 564(b)(1) of the Act, 21 U.S.C. section 360bbb-3(b)(1), unless the authorization is terminated or revoked.  Performed at [facility], [address] [place], [place] [address]   Comprehensive metabolic panel     Status: Abnormal   Collection Time: 09/30/20  4:35 AM  Result Value Ref Range   Sodium 138 135 - 145 mmol/L   Potassium 3.9 3.5 - 5.1 mmol/L   Chloride 105 98 - 111 mmol/L   CO2 22 22 - 32 mmol/L   Glucose, Bld 73 70 - 99 mg/dL    Comment: Glucose reference range applies only to samples taken after fasting for at least 8 hours.   BUN 8 6 - 20 mg/dL   Creatinine, Ser 0.36 (L) 0.44 - 1.00 mg/dL   Calcium 8.2 (L) 8.9 - 10.3 mg/dL   Total Protein 7.2 6.5 - 8.1 g/dL   Albumin 3.8 3.5 - 5.0  g/dL   AST 419 (H) 15 - 41 U/L   ALT 1,076 (H) 0 - 44 U/L   Alkaline Phosphatase 467 (H) 38 - 126 U/L   Total Bilirubin 7.1 (H) 0.3 - 1.2 mg/dL   GFR, Estimated >60 >60 mL/min    Comment: (NOTE) Calculated using the CKD-EPI Creatinine Equation (2021)    Anion gap 11 5 - 15    Comment: Performed at [facility], [address] [place], [place] [address]  CBC     Status: Abnormal   Collection Time: 09/30/20  4:35 AM  Result Value Ref Range   WBC 6.6 4.0 - 10.5 K/uL   RBC 3.74 (L) 3.87 - 5.11 MIL/uL   Hemoglobin 11.5 (L) 12.0 - 15.0 g/dL   HCT 35.1 (L) 36.0 - 46.0 %   MCV 93.9 80.0 - 100.0 fL   MCH 30.7 26.0 - 34.0 pg   MCHC 32.8 30.0 - 36.0 g/dL   RDW 12.5 11.5 - 15.5 %   Platelets 124 (L) 150 - 400 K/uL   nRBC 0.0 0.0 - 0.2 %    Comment: Performed at [facility], [address] [place], [place] [address]  POC Urine Pregnancy, ED     Status: None   Collection Time: 09/30/20 12:39 PM  Result Value Ref Range   Preg Test, Ur Negative Negative    MR ABDOMEN MRCP WO CONTRAST  Result Date: 09/30/2020 CLINICAL DATA:  Right upper quadrant pain and vomiting EXAM: MRI ABDOMEN WITHOUT CONTRAST  (INCLUDING MRCP) TECHNIQUE: Multiplanar multisequence MR imaging of the abdomen was performed. Heavily T2-weighted images of the biliary and pancreatic ducts were obtained, and three-dimensional MRCP images were rendered by post processing. COMPARISON:  None. FINDINGS: Lower chest: No acute findings. Hepatobiliary: No mass or other parenchymal abnormality identified. The common bile duct is dilated measuring 7 mm in diameter. There are multiple stones within the gallbladder. There is no signal abnormality of the gallbladder fossa. There is a stone within the distal common bile duct at the level of the ampulla. Pancreas: No mass, inflammatory changes,  or other parenchymal abnormality identified. Spleen:  Within normal limits in size and appearance. Adrenals/Urinary Tract: No masses  identified. No evidence of hydronephrosis. Stomach/Bowel: Visualized portions within the abdomen are unremarkable. Vascular/Lymphatic: No pathologically enlarged lymph nodes identified. No abdominal aortic aneurysm demonstrated. Other:  None. Musculoskeletal: No suspicious bone lesions identified. IMPRESSION: 1. Choledocholithiasis with mild dilatation of the common bile duct. 2. Cholelithiasis without signal changes of acute cholecystitis. Electronically Signed   By: Ulyses Jarred M.D.   On: 09/30/2020 03:03   MR 3D Recon At Scanner  Result Date: 09/30/2020 CLINICAL DATA:  Right upper quadrant pain and vomiting EXAM: MRI ABDOMEN WITHOUT CONTRAST  (INCLUDING MRCP) TECHNIQUE: Multiplanar multisequence MR imaging of the abdomen was performed. Heavily T2-weighted images of the biliary and pancreatic ducts were obtained, and three-dimensional MRCP images were rendered by post processing. COMPARISON:  None. FINDINGS: Lower chest: No acute findings. Hepatobiliary: No mass or other parenchymal abnormality identified. The common bile duct is dilated measuring 7 mm in diameter. There are multiple stones within the gallbladder. There is no signal abnormality of the gallbladder fossa. There is a stone within the distal common bile duct at the level of the ampulla. Pancreas: No mass, inflammatory changes, or other parenchymal abnormality identified. Spleen:  Within normal limits in size and appearance. Adrenals/Urinary Tract: No masses identified. No evidence of hydronephrosis. Stomach/Bowel: Visualized portions within the abdomen are unremarkable. Vascular/Lymphatic: No pathologically enlarged lymph nodes identified. No abdominal aortic aneurysm demonstrated. Other:  None. Musculoskeletal: No suspicious bone lesions identified. IMPRESSION: 1. Choledocholithiasis with mild dilatation of the common bile duct. 2. Cholelithiasis without signal changes of acute cholecystitis. Electronically Signed   By: Ulyses Jarred M.D.   On:  09/30/2020 03:03   US Abdomen Limited RUQ (LIVER/GB)  Result Date: 09/29/2020 CLINICAL DATA:  Right upper quadrant pain for 6 days. EXAM: ULTRASOUND ABDOMEN LIMITED RIGHT UPPER QUADRANT COMPARISON:  None. FINDINGS: Gallbladder: Numerous small gallstones are seen measuring up to 5 mm. No evidence of gallbladder wall thickening or pericholecystic fluid. Common bile duct: Diameter: 7 mm, which is mildly dilated. Probable mild dilatation of intrahepatic bile ducts also noted. Liver: No focal lesion identified. Within normal limits in parenchymal echogenicity. Portal vein is patent on color Doppler imaging with normal direction of blood flow towards the liver. Other: None. IMPRESSION: Cholelithiasis, without definite sonographic signs of cholecystitis. Mild biliary ductal dilatation, with common bile duct measuring 7 mm. Suggest correlation with liver function tests, and consider abdomen MRI/MRCP without and with contrast for further evaluation if clinically warranted. Electronically Signed   By: Marlaine Hind M.D.   On: 09/29/2020 09:14    Review of Systems  Gastrointestinal: Positive for abdominal pain, diarrhea and nausea.  Musculoskeletal: Positive for back pain.  All other systems reviewed and are negative.  Blood pressure 118/80, pulse 63, temperature 98 F (36.7 C), temperature source Oral, resp. rate 18, height 5\' 2"  (1.575 m), weight 74.8 kg, last menstrual period 09/29/2020, SpO2 98 %, currently breastfeeding. Physical Exam Constitutional:      General: She is not in acute distress. HENT:     Head: Normocephalic and atraumatic.     Nose:     Comments: Covered with a mask    Mouth/Throat:     Comments: Covered with a mask Eyes:     General: Scleral icterus present.        Right eye: No discharge.        Left eye: No discharge.  Cardiovascular:  Rate and Rhythm: Normal rate and regular rhythm.     Pulses: Normal pulses.  Pulmonary:     Effort: Pulmonary effort is normal. No  respiratory distress.  Abdominal:     General: There is no distension.     Palpations: Abdomen is soft.     Tenderness: There is abdominal tenderness. There is no guarding or rebound.     Comments: Mild tenderness to deep palpation in the right upper quadrant.  Murphy sign is negative.  Genitourinary:    Comments: Deferred Musculoskeletal:     Cervical back: Normal range of motion.     Right lower leg: No edema.     Left lower leg: No edema.  Skin:    General: Skin is warm.     Coloration: Skin is jaundiced.  Neurological:     Mental Status: She is alert and oriented to person, place, and time.  Psychiatric:        Mood and Affect: Mood normal.        Behavior: Behavior normal.     Assessment/Plan: This is a 34 year old woman with choledocholithiasis.  She is scheduled to undergo ERCP today to alleviate the bile duct obstruction.  I have recommended that she undergo cholecystectomy during this hospitalization.  We will plan to perform that tomorrow.  I discussed the risks of surgery with the patient and her significant other.  These include, but are not limited to, bleeding, infection, damage to surrounding tissues or structures, retained stone, bile leak, need for additional interventions or procedures, need to convert to an open operation.  They expressed understanding and would like to proceed.  Fredirick Maudlin 09/30/2020, 1:58 PM

## 2020-09-30 NOTE — Progress Notes (Signed)
35 y/o F with roughly 1 week history of abdominal/back pain.  Several ED visits unrevealing as to etiology, but persistent pain and rising LFTs led PCP to order RUQ [place] that showed cholelithiasis without obvious signs of cholecystitis, but also mild CBD dilation, possible intrahepatic BD dilation.  Bilirubin of 7.1 highly suggestive of bile duct obstruction, presumably from gallstone(s).  Lipase WNL.  Recommend:  Admission to medicine. MRCP   NPO with IVF.   GI consult for possible ERCP.    Surgery will evaluate for cholecystectomy during this admission.  Full consult note to follow.

## 2020-09-30 NOTE — Anesthesia Procedure Notes (Signed)
Date/Time: 09/30/2020 3:15 PM Performed by: Allean Found, CRNA Pre-anesthesia Checklist: Patient identified, Emergency Drugs available, Suction available, Patient being monitored and Timeout performed Oxygen Delivery Method: Nasal cannula Placement Confirmation: positive ETCO2

## 2020-09-30 NOTE — ED Notes (Signed)
Pt states she feels like she has a knife in her upper back. Pt states she has had nausea, vomiting, dirrhea, denies fever. Pt states was seen by her PMD and told has gallstones and to come to ed.

## 2020-09-30 NOTE — ED Notes (Signed)
Pt resting calmly in bed. Alert. In NAD. Visitor remains at bedside.

## 2020-09-30 NOTE — ED Notes (Signed)
New consent form filled out but pt yet to sign as procedure has not been thoroughly discussed with pt per pt.

## 2020-10-01 ENCOUNTER — Inpatient Hospital Stay: Payer: 59 | Admitting: Anesthesiology

## 2020-10-01 ENCOUNTER — Encounter
Admission: EM | Disposition: A | Discharge status: Home / Self Care | Payer: Self-pay | Source: Home / Self Care | Attending: Internal Medicine

## 2020-10-01 DIAGNOSIS — K8012 Calculus of gallbladder with acute and chronic cholecystitis without obstruction: Secondary | ICD-10-CM

## 2020-10-01 DIAGNOSIS — R1011 Right upper quadrant pain: Secondary | ICD-10-CM

## 2020-10-01 LAB — COMPREHENSIVE METABOLIC PANEL
ALT: 951 U/L — ABNORMAL HIGH (ref 0–44)
AST: 270 U/L — ABNORMAL HIGH (ref 15–41)
Albumin: 3.7 g/dL (ref 3.5–5.0)
Alkaline Phosphatase: 523 U/L — ABNORMAL HIGH (ref 38–126)
Anion gap: 9 (ref 5–15)
BUN: 7 mg/dL (ref 6–20)
CO2: 24 mmol/L (ref 22–32)
Calcium: 8.8 mg/dL — ABNORMAL LOW (ref 8.9–10.3)
Chloride: 107 mmol/L (ref 98–111)
Creatinine, Ser: 0.46 mg/dL (ref 0.44–1.00)
GFR, Estimated: >60 mL/min (ref >60)
Glucose, Bld: 95 mg/dL (ref 70–99)
Potassium: 4.3 mmol/L (ref 3.5–5.1)
Sodium: 140 mmol/L (ref 135–145)
Total Bilirubin: 3 mg/dL — ABNORMAL HIGH (ref 0.3–1.2)
Total Protein: 7 g/dL (ref 6.5–8.1)

## 2020-10-01 LAB — CBC
HCT: 35.6 % — ABNORMAL LOW (ref 36.0–46.0)
Hemoglobin: 12 g/dL (ref 12.0–15.0)
MCH: 31.3 pg (ref 26.0–34.0)
MCHC: 33.7 g/dL (ref 30.0–36.0)
MCV: 93 fL (ref 80.0–100.0)
Platelets: 158 10*3/uL (ref 150–400)
RBC: 3.83 MIL/uL — ABNORMAL LOW (ref 3.87–5.11)
RDW: 12.7 % (ref 11.5–15.5)
WBC: 5.9 10*3/uL (ref 4.0–10.5)
nRBC: 0 % (ref 0.0–0.2)

## 2020-10-01 LAB — LIPASE, BLOOD: Lipase: 29 U/L (ref 11–51)

## 2020-10-01 LAB — MAGNESIUM: Magnesium: 2 mg/dL (ref 1.7–2.4)

## 2020-10-01 LAB — PHOSPHORUS: Phosphorus: 2.9 mg/dL (ref 2.5–4.6)

## 2020-10-01 LAB — SURGICAL PCR SCREEN
MRSA, PCR: NEGATIVE
Staphylococcus aureus: NEGATIVE

## 2020-10-01 SURGERY — CHOLECYSTECTOMY, ROBOT-ASSISTED, LAPAROSCOPIC
Anesthesia: General | Site: Abdomen

## 2020-10-01 MED ORDER — ACETAMINOPHEN 500 MG PO TABS
1000.0000 mg | ORAL_TABLET | Freq: Four times a day (QID) | ORAL | Status: DC
  Administered 2020-10-01 – 2020-10-03 (×7): 1000 mg via ORAL
  Filled 2020-10-01 (×7): qty 2

## 2020-10-01 MED ORDER — DEXAMETHASONE SODIUM PHOSPHATE 10 MG/ML IJ SOLN
INTRAMUSCULAR | Status: DC | PRN
  Administered 2020-10-01: 10 mg via INTRAVENOUS

## 2020-10-01 MED ORDER — ONDANSETRON HCL 4 MG/2ML IJ SOLN: 4.0000 mg | Freq: Once | INTRAMUSCULAR | Status: DC | PRN

## 2020-10-01 MED ORDER — LIDOCAINE-EPINEPHRINE 1 %-1:100000 IJ SOLN
INTRAMUSCULAR | Status: DC | PRN
  Administered 2020-10-01: 40 mL

## 2020-10-01 MED ORDER — LIDOCAINE HCL (CARDIAC) PF 100 MG/5ML IV SOSY
PREFILLED_SYRINGE | INTRAVENOUS | Status: DC | PRN
  Administered 2020-10-01: 100 mg via INTRAVENOUS

## 2020-10-01 MED ORDER — INDOCYANINE GREEN 25 MG IV SOLR
7.5000 mg | Freq: Once | INTRAVENOUS | Status: AC
  Administered 2020-10-01: 7.5 mg via INTRAVENOUS
  Filled 2020-10-01: qty 10

## 2020-10-01 MED ORDER — ONDANSETRON HCL 4 MG/2ML IJ SOLN
INTRAMUSCULAR | Status: DC | PRN
  Administered 2020-10-01: 4 mg via INTRAVENOUS

## 2020-10-01 MED ORDER — NEOSTIGMINE METHYLSULFATE 10 MG/10ML IV SOLN
INTRAVENOUS | Status: DC | PRN
  Administered 2020-10-01: 3 mg via INTRAVENOUS

## 2020-10-01 MED ORDER — FENTANYL CITRATE (PF) 100 MCG/2ML IJ SOLN
25.0000 ug | INTRAMUSCULAR | Status: DC | PRN
  Administered 2020-10-01 (×3): 25 ug via INTRAVENOUS

## 2020-10-01 MED ORDER — ACETAMINOPHEN 10 MG/ML IV SOLN
INTRAVENOUS | Status: DC | PRN
  Administered 2020-10-01: 1000 mg via INTRAVENOUS

## 2020-10-01 MED ORDER — LACTATED RINGERS IV SOLN: INTRAVENOUS | Status: DC | PRN

## 2020-10-01 MED ORDER — MIDAZOLAM HCL 2 MG/2ML IJ SOLN
INTRAMUSCULAR | Status: AC
  Filled 2020-10-01: qty 2

## 2020-10-01 MED ORDER — FENTANYL CITRATE (PF) 100 MCG/2ML IJ SOLN
INTRAMUSCULAR | Status: DC | PRN
  Administered 2020-10-01 (×2): 50 ug via INTRAVENOUS

## 2020-10-01 MED ORDER — ONDANSETRON HCL 4 MG/2ML IJ SOLN
4.0000 mg | INTRAMUSCULAR | Status: DC | PRN
  Administered 2020-10-01 – 2020-10-02 (×5): 4 mg via INTRAVENOUS
  Filled 2020-10-01 (×5): qty 2

## 2020-10-01 MED ORDER — FENTANYL CITRATE (PF) 100 MCG/2ML IJ SOLN
INTRAMUSCULAR | Status: AC
  Filled 2020-10-01: qty 2

## 2020-10-01 MED ORDER — PROPOFOL 10 MG/ML IV BOLUS
INTRAVENOUS | Status: AC
  Filled 2020-10-01: qty 20

## 2020-10-01 MED ORDER — MIDAZOLAM HCL 2 MG/2ML IJ SOLN
INTRAMUSCULAR | Status: DC | PRN
  Administered 2020-10-01: 2 mg via INTRAVENOUS

## 2020-10-01 MED ORDER — SODIUM CHLORIDE 0.9 % IV SOLN
INTRAVENOUS | Status: DC | PRN
  Administered 2020-10-01 (×3): 100 ug via INTRAVENOUS
  Administered 2020-10-01: 50 ug via INTRAVENOUS

## 2020-10-01 MED ORDER — HYDROMORPHONE HCL 1 MG/ML IJ SOLN
0.5000 mg | INTRAMUSCULAR | Status: DC | PRN
  Administered 2020-10-01 – 2020-10-02 (×8): 0.5 mg via INTRAVENOUS
  Filled 2020-10-01 (×8): qty 0.5

## 2020-10-01 MED ORDER — INFLUENZA VAC SPLIT QUAD 0.5 ML IM SUSY: 0.5000 mL | PREFILLED_SYRINGE | INTRAMUSCULAR | Status: DC

## 2020-10-01 MED ORDER — ACETAMINOPHEN 10 MG/ML IV SOLN
INTRAVENOUS | Status: AC
  Filled 2020-10-01: qty 100

## 2020-10-01 MED ORDER — TRAMADOL HCL 50 MG PO TABS: 50.0000 mg | ORAL_TABLET | Freq: Four times a day (QID) | ORAL | Status: DC | PRN

## 2020-10-01 MED ORDER — KETOROLAC TROMETHAMINE 30 MG/ML IJ SOLN
INTRAMUSCULAR | Status: DC | PRN
  Administered 2020-10-01: 30 mg via INTRAVENOUS

## 2020-10-01 MED ORDER — IBUPROFEN 800 MG PO TABS
800.0000 mg | ORAL_TABLET | Freq: Four times a day (QID) | ORAL | Status: DC | PRN
  Administered 2020-10-02: 800 mg via ORAL
  Filled 2020-10-01 (×2): qty 1
  Filled 2020-10-01: qty 2

## 2020-10-01 MED ORDER — PROPOFOL 10 MG/ML IV BOLUS
INTRAVENOUS | Status: DC | PRN
Start: 2020-10-01 — End: 2020-10-01
  Administered 2020-10-01: 180 mg via INTRAVENOUS

## 2020-10-01 MED ORDER — SEVOFLURANE IN SOLN
RESPIRATORY_TRACT | Status: AC
  Filled 2020-10-01: qty 250

## 2020-10-01 MED ORDER — CEFAZOLIN SODIUM-DEXTROSE 2-3 GM-%(50ML) IV SOLR
INTRAVENOUS | Status: DC | PRN
Start: 2020-10-01 — End: 2020-10-01
  Administered 2020-10-01: 2 g via INTRAVENOUS

## 2020-10-01 MED ORDER — GLYCOPYRROLATE 0.2 MG/ML IJ SOLN
INTRAMUSCULAR | Status: DC | PRN
  Administered 2020-10-01: .4 mg via INTRAVENOUS

## 2020-10-01 MED ORDER — ROCURONIUM BROMIDE 100 MG/10ML IV SOLN
INTRAVENOUS | Status: DC | PRN
  Administered 2020-10-01: 50 mg via INTRAVENOUS
  Administered 2020-10-01: 20 mg via INTRAVENOUS

## 2020-10-01 SURGICAL SUPPLY — 57 items
BLADE SURG SZ11 CARB STEEL (BLADE) ×2 IMPLANT
CANISTER SUCT 1200ML W/VALVE (MISCELLANEOUS) ×2 IMPLANT
CANNULA REDUC XI 12-8 STAPL (CANNULA) ×1
CANNULA REDUCER 12-8 DVNC XI (CANNULA) ×1 IMPLANT
CHLORAPREP W/TINT 26 (MISCELLANEOUS) ×2 IMPLANT
CLIP VESOLOCK MED LG 6/CT (CLIP) ×4 IMPLANT
COVER TIP SHEARS 8 DVNC (MISCELLANEOUS) ×1 IMPLANT
COVER TIP SHEARS 8MM DA VINCI (MISCELLANEOUS) ×1
COVER WAND RF STERILE (DRAPES) ×2 IMPLANT
DECANTER SPIKE VIAL GLASS SM (MISCELLANEOUS) ×4 IMPLANT
DEFOGGER SCOPE WARMER CLEARIFY (MISCELLANEOUS) ×2 IMPLANT
DERMABOND ADVANCED (GAUZE/BANDAGES/DRESSINGS) ×1
DERMABOND ADVANCED .7 DNX12 (GAUZE/BANDAGES/DRESSINGS) ×1 IMPLANT
DRAPE ARM DVNC X/XI (DISPOSABLE) ×4 IMPLANT
DRAPE COLUMN DVNC XI (DISPOSABLE) ×1 IMPLANT
DRAPE DA VINCI XI ARM (DISPOSABLE) ×4
DRAPE DA VINCI XI COLUMN (DISPOSABLE) ×1
ELECT CAUTERY BLADE TIP 2.5 (TIP) ×2
ELECT REM PT RETURN 9FT ADLT (ELECTROSURGICAL) ×2
ELECTRODE CAUTERY BLDE TIP 2.5 (TIP) ×1 IMPLANT
ELECTRODE REM PT RTRN 9FT ADLT (ELECTROSURGICAL) ×1 IMPLANT
GLOVE INDICATOR 7.0 STRL GRN (GLOVE) ×4 IMPLANT
GLOVE SURG ENC MOIS LTX SZ6.5 (GLOVE) ×4 IMPLANT
GOWN STRL REUS W/ TWL LRG LVL3 (GOWN DISPOSABLE) ×3 IMPLANT
GOWN STRL REUS W/TWL LRG LVL3 (GOWN DISPOSABLE) ×3
GRASPER SUT TROCAR 14GX15 (MISCELLANEOUS) IMPLANT
IRRIGATOR SUCT 8 DISP DVNC XI (IRRIGATION / IRRIGATOR) ×1 IMPLANT
IRRIGATOR SUCTION 8MM XI DISP (IRRIGATION / IRRIGATOR) ×1
IV NS 1000ML (IV SOLUTION) ×1
IV NS 1000ML BAXH (IV SOLUTION) ×1 IMPLANT
KIT PINK PAD W/HEAD ARE REST (MISCELLANEOUS) ×2
KIT PINK PAD W/HEAD ARM REST (MISCELLANEOUS) ×1 IMPLANT
LABEL OR SOLS (LABEL) ×2 IMPLANT
MANIFOLD NEPTUNE II (INSTRUMENTS) ×2 IMPLANT
NEEDLE HYPO 22GX1.5 SAFETY (NEEDLE) ×2 IMPLANT
NEEDLE INSUFFLATION 14GA 120MM (NEEDLE) ×2 IMPLANT
NS IRRIG 500ML POUR BTL (IV SOLUTION) ×2 IMPLANT
OBTURATOR OPTICAL STANDARD 8MM (TROCAR) ×1
OBTURATOR OPTICAL STND 8 DVNC (TROCAR) ×1
OBTURATOR OPTICALSTD 8 DVNC (TROCAR) ×1 IMPLANT
PACK LAP CHOLECYSTECTOMY (MISCELLANEOUS) ×2 IMPLANT
PENCIL ELECTRO HAND CTR (MISCELLANEOUS) ×2 IMPLANT
POUCH SPECIMEN RETRIEVAL 10MM (ENDOMECHANICALS) ×2 IMPLANT
SEAL CANN UNIV 5-8 DVNC XI (MISCELLANEOUS) ×3 IMPLANT
SEAL XI 5MM-8MM UNIVERSAL (MISCELLANEOUS) ×3
SET TUBE SMOKE EVAC HIGH FLOW (TUBING) ×2 IMPLANT
SOLUTION ELECTROLUBE (MISCELLANEOUS) ×2 IMPLANT
STAPLER CANNULA SEAL DVNC XI (STAPLE) ×1 IMPLANT
STAPLER CANNULA SEAL XI (STAPLE) ×1
STRIP CLOSURE SKIN 1/2X4 (GAUZE/BANDAGES/DRESSINGS) ×2 IMPLANT
SUT MNCRL 4-0 (SUTURE) ×1
SUT MNCRL 4-0 27XMFL (SUTURE) ×1
SUT VIC AB 3-0 SH 27 (SUTURE)
SUT VIC AB 3-0 SH 27X BRD (SUTURE) IMPLANT
SUT VICRYL 0 AB UR-6 (SUTURE) IMPLANT
SUTURE MNCRL 4-0 27XMF (SUTURE) ×1 IMPLANT
TROCAR XCEL NON-BLD 5MMX100MML (ENDOMECHANICALS) IMPLANT

## 2020-10-01 NOTE — Plan of Care (Addendum)
Pt arrived to [facility] at 1:00pm awake, alert and on RA. Pt husband awaiting in the room. Prn pain med adm for c/o of LLQ pain 7/10, effective. Bowel sounds present x4.  Port sites x 4 intact. IVF restarted per MD order. Pt tolerating clear liquid diet. Pt ambulates from bed to restroom. Vitals stable. Safety measures in place. Will continue to monitor.  Problem: Education: Goal: Knowledge of General Education information will improve Description: Including pain rating scale, medication(s)/side effects and non-pharmacologic comfort measures Outcome: Progressing   Problem: Health Behavior/Discharge Planning: Goal: Ability to manage health-related needs will improve Outcome: Progressing   Problem: Clinical Measurements: Goal: Ability to maintain clinical measurements within normal limits will improve Outcome: Progressing Goal: Will remain free from infection Outcome: Progressing Goal: Diagnostic test results will improve Outcome: Progressing Goal: Respiratory complications will improve Outcome: Progressing Goal: Cardiovascular complication will be avoided Outcome: Progressing   Problem: Activity: Goal: Risk for activity intolerance will decrease Outcome: Progressing   Problem: Nutrition: Goal: Adequate nutrition will be maintained Outcome: Progressing   Problem: Coping: Goal: Level of anxiety will decrease Outcome: Progressing   Problem: Elimination: Goal: Will not experience complications related to bowel motility Outcome: Progressing Goal: Will not experience complications related to urinary retention Outcome: Progressing   Problem: Elimination: Goal: Will not experience complications related to bowel motility Outcome: Progressing Goal: Will not experience complications related to urinary retention Outcome: Progressing   Problem: Pain Managment: Goal: General experience of comfort will improve Outcome: Progressing   Problem: Safety: Goal: Ability to remain free from  injury will improve Outcome: Progressing   Problem: Skin Integrity: Goal: Risk for impaired skin integrity will decrease Outcome: Progressing

## 2020-10-01 NOTE — Anesthesia Preprocedure Evaluation (Signed)
Anesthesia Evaluation  Patient identified by MRN, date of birth, ID band Patient awake    Reviewed: Allergy & Precautions, NPO status , Patient's Chart, lab work & pertinent test results  History of Anesthesia Complications Negative for: history of anesthetic complications  Airway Mallampati: II       Dental   Pulmonary asthma (no meds) , neg sleep apnea, neg COPD, Not current smoker,           Cardiovascular (-) hypertension(-) Past MI and (-) CHF (-) dysrhythmias (-) Valvular Problems/Murmurs     Neuro/Psych neg Seizures Anxiety Depression    GI/Hepatic Neg liver ROS, GERD (mild, no meds)  ,  Endo/Other  neg diabetes  Renal/GU negative Renal ROS     Musculoskeletal   Abdominal   Peds  Hematology   Anesthesia Other Findings   Reproductive/Obstetrics                             Anesthesia Physical  Anesthesia Plan  ASA: II and emergent  Anesthesia Plan: General   Post-op Pain Management:    Induction: Intravenous  PONV Risk Score and Plan: 3 and Ondansetron and Dexamethasone  Airway Management Planned: Oral ETT  Additional Equipment:   Intra-op Plan:   Post-operative Plan:   Informed Consent: I have reviewed the patients History and Physical, chart, labs and discussed the procedure including the risks, benefits and alternatives for the proposed anesthesia with the patient or authorized representative who has indicated his/her understanding and acceptance.       Plan Discussed with:   Anesthesia Plan Comments: (Pt with no vomiting, no reflux symptoms. Will use natural airway with propofol infusion.)        Anesthesia Quick Evaluation

## 2020-10-01 NOTE — Op Note (Signed)
Operative note  Pre-operative Diagnosis: Choledocholithiasis  Post-operative Diagnosis: Choledocholithiasis and acute cholecystitis  Procedure: Robot assisted laparoscopic cholecystectomy  Surgeon: Fredirick Maudlin, MD  Anesthesia: GETA  Findings: The stomach and duodenum were adherent to the cystic duct.  There was inflammation surrounding the cystic duct structures.  The gallbladder was tense and inflamed appearing.  Estimated Blood Loss: 10 cc cc       Specimens: Gallbladder           Complications: none immediately apparent  Procedure In Detail: The patient was seen again in the holding room. The benefits, complications, treatment options, and expected outcomes were discussed with the patient. The risks of bleeding, infection, recurrence of symptoms, failure to resolve symptoms, bile duct damage, bile duct leak, retained common bile duct stone, bowel injury, any of which could require further surgery and/or ERCP, stent, or papillotomy were reviewed with the patient. The likelihood of improving the patient's symptoms with return to their baseline status is good.  The patient and/or family concurred with the proposed plan, giving informed consent.  The patient was taken to operating room, identified and the procedure verified as laparoscopic cholecystectomy.  A time out was held and the above information confirmed.  Prior to the induction of general anesthesia, antibiotic prophylaxis was administered. VTE prophylaxis was in place. General endotracheal anesthesia was then administered and tolerated well. After the induction, the abdomen was prepped with Chloraprep and draped in the sterile fashion. The patient was positioned in the supine position.  Optiview technique was used to enter the abdomen in the right upper quadrant via a standard 5 mm laparoscopic trocar.  Pneumoperitoneum was then created with CO2 and tolerated well without any adverse changes in the patient's vital signs.  A 12  mm robotic trocar along with three 8-mm robotic trochars were placed under direct vision.  All skin incisions were infiltrated with a local anesthetic agent before making the incision and placing the trocars.   The patient was positioned in 15 degrees of reverse Trendelenburg and tilted 10 degrees to the left.  The robot was brought to the surgical field and docked in the standard fashion.  We made sure all the instrumentation was kept in direct view at all times and that there were no collision between the arms.  I scrubbed out and went to the console.  The gallbladder was identified, the fundus grasped and retracted cephalad. Adhesions were lysed bluntly.  These were quite extensive and involves the stomach and duodenum, as well as the omentum.  During the course of dividing these adhesions, a small artery was divided that in retrospect was likely the cystic artery, as the cystic artery was not appreciated once the critical view of safety was achieved.  The infundibulum was grasped and retracted laterally, exposing the peritoneum overlying the triangle of Calot. This was then divided and exposed in a blunt fashion. An extended critical view of the cystic duct was obtained.  The cystic duct was clearly identified and bluntly dissected away from the surrounding tissues, as was the cystic artery.  Using ICG cholangiography we visualized the cystic duct and confirmed that there was no aberrant biliary ductal anatomy nor any evidence of bile duct injury.  The cystic duct was clipped and divided.  One of the clips slipped off and this was replaced.  No other vascular structures were appreciated, which led me to believe that the small artery divided earlier was probably the cystic artery. The gallbladder was taken from the gallbladder fossa  in a retrograde fashion with the electrocautery. Hemostasis was achieved with the electrocautery. Inspection of the right upper quadrant was performed. No bleeding, bile duct  injury or leak, or bowel injury was noted.  The gallbladder was then placed in an Endopouch bag. The robotic instruments were removed and robotic arms were undocked in the standard fashion.    I scrubbed back in.  The gallbladder was removed via the 12 mm trocar site.  Using the PMI and a Carter-Thomason cone, the 12 mm port site was then closed with interrupted 0 Vicryl sutures.  The remaining 8 mm ports were removed and pneumoperitoneum was released.  The 12 mm port site was closed with deep dermal 3-0 Vicryl.  4-0 subcuticular Monocryl was used to close the skin of all port sites. Dermabond was applied, followed by Steri-Strips.  The patient was then awakened, extubated, and taken to the postanesthesia recovery unit in stable condition.   Sponge, lap, and needle counts were reported to be correct number at closure and at the conclusion of the case.          Fredirick Maudlin, MD FACS

## 2020-10-01 NOTE — Anesthesia Procedure Notes (Signed)
Procedure Name: Intubation Performed by: Daurice Ovando, CRNA Pre-anesthesia Checklist: Patient identified, Patient being monitored, Timeout performed, Emergency Drugs available and Suction available Patient Re-evaluated:Patient Re-evaluated prior to induction Oxygen Delivery Method: Circle system utilized Preoxygenation: Pre-oxygenation with 100% oxygen Induction Type: IV induction Ventilation: Mask ventilation without difficulty Laryngoscope Size: 3 and McGraph Grade View: Grade I Tube type: Oral Tube size: 7.0 mm Number of attempts: 1 Airway Equipment and Method: Stylet Placement Confirmation: ETT inserted through vocal cords under direct vision,  positive ETCO2 and breath sounds checked- equal and bilateral Secured at: 22 cm Tube secured with: Tape Dental Injury: Teeth and Oropharynx as per pre-operative assessment        

## 2020-10-01 NOTE — Anesthesia Postprocedure Evaluation (Signed)
Anesthesia Post Note  Patient: TREENA COSMAN Amoroso  Procedure(s) Performed: XI ROBOTIC ASSISTED LAPAROSCOPIC CHOLECYSTECTOMY (N/A Abdomen)  Patient location during evaluation: PACU Anesthesia Type: General Level of consciousness: awake and oriented Pain management: pain level controlled Vital Signs Assessment: post-procedure vital signs reviewed and stable Respiratory status: spontaneous breathing, patient connected to face mask oxygen and respiratory function stable Cardiovascular status: blood pressure returned to baseline and stable Postop Assessment: no headache Anesthetic complications: no   No complications documented.   Last Vitals:  Vitals:   10/01/20 0810 10/01/20 1156  BP: 120/78 125/81  Pulse: 78 (!) 103  Resp: 18 18  Temp: 36.9 C 36.6 C  SpO2: 100% 100%    Last Pain:  Vitals:   10/01/20 0810  TempSrc: Oral  PainSc:                  Johnnye Lana

## 2020-10-01 NOTE — Transfer of Care (Signed)
Immediate Anesthesia Transfer of Care Note  Patient: Tina Wilson  Procedure(s) Performed: XI ROBOTIC ASSISTED LAPAROSCOPIC CHOLECYSTECTOMY (N/A Abdomen)  Patient Location: PACU  Anesthesia Type:General  Level of Consciousness: awake, alert  and oriented  Airway & Oxygen Therapy: Patient Spontanous Breathing and Patient connected to face mask oxygen  Post-op Assessment: Post -op Vital signs reviewed and stable  Post vital signs: stable  Last Vitals:  Vitals Value Taken Time  BP 125/81 10/01/20 1156  Temp 36.6 C 10/01/20 1156  Pulse 95 10/01/20 1202  Resp 20 10/01/20 1202  SpO2 100 % 10/01/20 1202  Vitals shown include unvalidated device data.  Last Pain:  Vitals:   10/01/20 0810  TempSrc: Oral  PainSc:          Complications: No complications documented.

## 2020-10-01 NOTE — Progress Notes (Signed)
PROGRESS NOTE    Tina Wilson  KVQ:[id number] DOB: Aug 17, 1986 DOA: 09/30/2020 PCP: Lorrene Reid, PA-C   Brief Narrative: Taken from H&P Tina Wilson  is a 35 y.o. Caucasian female with a known history of asthma, and anxiety and depression and migraine, who presented to the emergency room with acute onset of right upper back pain that felt like stabbing knife about 7 to 8 days ago and has been intermittent since then with associated nausea and vomiting. She was found to have markedly elevated liver enzymes and T bili.  Ultrasound with cholelithiasis, no sonographic sign of cholecystitis.  Bile duct dilatation and concern for obstructive jaundice. GI and general surgery was consulted. Patient had MRCP which shows obstructing stone in ampulla, going for ERCP later today followed by cholecystectomy today.  Subjective: Patient had her laparoscopic cholecystectomy today.  No concerning complications.  Assessment & Plan:   Active Problems:   Choledocholithiasis   Increased liver enzymes  Obstructive jaundice secondary to obstructive choledocholithiasis. Elevated liver enzymes and T bili.  MRCP with concern of obstructive gallstone in ampulla.  Had ERCP with sphincterotomy yesterday and laparoscopic cholecystectomy today. Hepatitis panel negative.  Liver enzymes improving. -Continue with postoperative pain management.  History of depression.  No acute concern. -Continue with home dose of Prozac  Objective: Vitals:   10/01/20 1226 10/01/20 1241 10/01/20 1329 10/01/20 1351  BP: 118/72 116/72 117/77 123/82  Pulse: 94 87 89 84  Resp: 16 13 18 18   Temp:  98 F (36.7 C) 98.8 F (37.1 C) 99.1 F (37.3 C)  TempSrc:   Oral Oral  SpO2: 98% 100% 100% 99%  Weight:      Height:        Intake/Output Summary (Last 24 hours) at 10/01/2020 1356 Last data filed at 10/01/2020 1144 Gross per 24 hour  Intake 1909.85 ml  Output 210 ml  Net 1699.85 ml   Filed Weights    09/29/20 1418 09/30/20 1434  Weight: 74.8 kg 74.8 kg    Examination:  General.  Well-developed lady, in no acute distress. Pulmonary.  Lungs clear bilaterally, normal respiratory effort. CV.  Regular rate and rhythm, no JVD, rub or murmur. Abdomen.  Soft, nontender, nondistended, BS positive. CNS.  Alert and oriented x3.  No focal neurologic deficit. Extremities.  No edema, no cyanosis, pulses intact and symmetrical. Psychiatry.  Judgment and insight appears normal.  DVT prophylaxis: SCDs.  Patient has thrombocytopenia Code Status: Full Family Communication: Husband was updated at bedside. Disposition Plan:  Status is: Inpatient  Remains inpatient appropriate because:Inpatient level of care appropriate due to severity of illness   Dispo: The patient is from: Home              Anticipated d/c is to: Home              Anticipated d/c date is: 1 day.              Patient currently is not medically stable to d/c.   Consultants:   GI  General surgery  Procedures:  Antimicrobials:   Data Reviewed: I have personally reviewed following labs and imaging studies  CBC: Recent Labs  Lab 09/26/20 2131 09/29/20 1422 09/30/20 0435 10/01/20 0418  WBC 12.3* 7.5 6.6 5.9  HGB 11.7* 12.9 11.5* 12.0  HCT 35.8* 38.8 35.1* 35.6*  MCV 93.2 92.8 93.9 93.0  PLT 133* 146* 124* 643   Basic Metabolic Panel: Recent Labs  Lab 09/26/20 2131 09/29/20 1422 09/30/20 0435  10/01/20 0418  NA 138 140 138 140  K 4.0 4.3 3.9 4.3  CL 104 102 105 107  CO2 23 24 22 24   GLUCOSE 104* 98 73 95  BUN 10 9 8 7   CREATININE 0.46 0.33* 0.36* 0.46  CALCIUM 9.2 9.2 8.2* 8.8*  MG  --   --   --  2.0  PHOS  --   --   --  2.9   GFR: Estimated Creatinine Clearance: 94.7 mL/min (by C-G formula based on SCr of 0.46 mg/dL). Liver Function Tests: Recent Labs  Lab 09/26/20 2131 09/29/20 1422 09/30/20 0435 10/01/20 0418  AST 64* 495* 419* 270*  ALT 48* 1,220* 1,076* 951*  ALKPHOS 111 489* 467* 523*   BILITOT 0.7 7.1* 7.1* 3.0*  PROT 7.5 8.2* 7.2 7.0  ALBUMIN 4.3 4.5 3.8 3.7   Recent Labs  Lab 09/26/20 2131 09/29/20 1422 10/01/20 0418  LIPASE 28 28 29    No results for input(s): AMMONIA in the last 168 hours. Coagulation Profile: No results for input(s): INR, PROTIME in the last 168 hours. Cardiac Enzymes: No results for input(s): CKTOTAL, CKMB, CKMBINDEX, TROPONINI in the last 168 hours. BNP (last 3 results) No results for input(s): PROBNP in the last 8760 hours. HbA1C: No results for input(s): HGBA1C in the last 72 hours. CBG: No results for input(s): GLUCAP in the last 168 hours. Lipid Profile: No results for input(s): CHOL, HDL, LDLCALC, TRIG, CHOLHDL, LDLDIRECT in the last 72 hours. Thyroid Function Tests: No results for input(s): TSH, T4TOTAL, FREET4, T3FREE, THYROIDAB in the last 72 hours. Anemia Panel: No results for input(s): VITAMINB12, FOLATE, FERRITIN, TIBC, IRON, RETICCTPCT in the last 72 hours. Sepsis Labs: No results for input(s): PROCALCITON, LATICACIDVEN in the last 168 hours.  Recent Results (from the past 240 hour(s))  Resp Panel by RT-PCR (Flu A&B, Covid) Nasopharyngeal Swab     Status: None   Collection Time: 09/30/20  3:23 AM   Specimen: Nasopharyngeal Swab; Nasopharyngeal(NP) swabs in vial transport medium  Result Value Ref Range Status   SARS Coronavirus 2 by RT PCR NEGATIVE NEGATIVE Final    Comment: (NOTE) SARS-CoV-2 target nucleic acids are NOT DETECTED.  The SARS-CoV-2 RNA is generally detectable in upper respiratory specimens during the acute phase of infection. The lowest concentration of SARS-CoV-2 viral copies this assay can detect is 138 copies/mL. A negative result does not preclude SARS-Cov-2 infection and should not be used as the sole basis for treatment or other patient management decisions. A negative result may occur with  improper specimen collection/handling, submission of specimen other than nasopharyngeal swab, presence of  viral mutation(s) within the areas targeted by this assay, and inadequate number of viral copies(<138 copies/mL). A negative result must be combined with clinical observations, patient history, and epidemiological information. The expected result is Negative.  Fact Sheet for Patients:  EntrepreneurPulse.com.au  Fact Sheet for Healthcare Providers:  IncredibleEmployment.be  This test is no t yet approved or cleared by the [place] FDA and  has been authorized for detection and/or diagnosis of SARS-CoV-2 by FDA under an Emergency Use Authorization (EUA). This EUA will remain  in effect (meaning this test can be used) for the duration of the COVID-19 declaration under Section 564(b)(1) of the Act, 21 U.S.C.section 360bbb-3(b)(1), unless the authorization is terminated  or revoked sooner.       Influenza A by PCR NEGATIVE NEGATIVE Final   Influenza B by PCR NEGATIVE NEGATIVE Final    Comment: (NOTE) The Xpert Xpress SARS-CoV-2/FLU/RSV  plus assay is intended as an aid in the diagnosis of influenza from Nasopharyngeal swab specimens and should not be used as a sole basis for treatment. Nasal washings and aspirates are unacceptable for Xpert Xpress SARS-CoV-2/FLU/RSV testing.  Fact Sheet for Patients: EntrepreneurPulse.com.au  Fact Sheet for Healthcare Providers: IncredibleEmployment.be  This test is not yet approved or cleared by the [place] FDA and has been authorized for detection and/or diagnosis of SARS-CoV-2 by FDA under an Emergency Use Authorization (EUA). This EUA will remain in effect (meaning this test can be used) for the duration of the COVID-19 declaration under Section 564(b)(1) of the Act, 21 U.S.C. section 360bbb-3(b)(1), unless the authorization is terminated or revoked.  Performed at [facility], [address] [place], [place] [address]   Surgical PCR screen      Status: None   Collection Time: 09/30/20 10:57 PM   Specimen: Nasal Mucosa; Nasal Swab  Result Value Ref Range Status   MRSA, PCR NEGATIVE NEGATIVE Final   Staphylococcus aureus NEGATIVE NEGATIVE Final    Comment: (NOTE) The Xpert SA Assay (FDA approved for NASAL specimens in patients 55 years of age and older), is one component of a comprehensive surveillance program. It is not intended to diagnose infection nor to guide or monitor treatment. Performed at [facility], [address] [place], [place] [address]      Radiology Studies: MR ABDOMEN MRCP WO CONTRAST  Result Date: 09/30/2020 CLINICAL DATA:  Right upper quadrant pain and vomiting EXAM: MRI ABDOMEN WITHOUT CONTRAST  (INCLUDING MRCP) TECHNIQUE: Multiplanar multisequence MR imaging of the abdomen was performed. Heavily T2-weighted images of the biliary and pancreatic ducts were obtained, and three-dimensional MRCP images were rendered by post processing. COMPARISON:  None. FINDINGS: Lower chest: No acute findings. Hepatobiliary: No mass or other parenchymal abnormality identified. The common bile duct is dilated measuring 7 mm in diameter. There are multiple stones within the gallbladder. There is no signal abnormality of the gallbladder fossa. There is a stone within the distal common bile duct at the level of the ampulla. Pancreas: No mass, inflammatory changes, or other parenchymal abnormality identified. Spleen:  Within normal limits in size and appearance. Adrenals/Urinary Tract: No masses identified. No evidence of hydronephrosis. Stomach/Bowel: Visualized portions within the abdomen are unremarkable. Vascular/Lymphatic: No pathologically enlarged lymph nodes identified. No abdominal aortic aneurysm demonstrated. Other:  None. Musculoskeletal: No suspicious bone lesions identified. IMPRESSION: 1. Choledocholithiasis with mild dilatation of the common bile duct. 2. Cholelithiasis without signal changes of acute  cholecystitis. Electronically Signed   By: Ulyses Jarred M.D.   On: 09/30/2020 03:03   MR 3D Recon At Scanner  Result Date: 09/30/2020 CLINICAL DATA:  Right upper quadrant pain and vomiting EXAM: MRI ABDOMEN WITHOUT CONTRAST  (INCLUDING MRCP) TECHNIQUE: Multiplanar multisequence MR imaging of the abdomen was performed. Heavily T2-weighted images of the biliary and pancreatic ducts were obtained, and three-dimensional MRCP images were rendered by post processing. COMPARISON:  None. FINDINGS: Lower chest: No acute findings. Hepatobiliary: No mass or other parenchymal abnormality identified. The common bile duct is dilated measuring 7 mm in diameter. There are multiple stones within the gallbladder. There is no signal abnormality of the gallbladder fossa. There is a stone within the distal common bile duct at the level of the ampulla. Pancreas: No mass, inflammatory changes, or other parenchymal abnormality identified. Spleen:  Within normal limits in size and appearance. Adrenals/Urinary Tract: No masses identified. No evidence of hydronephrosis. Stomach/Bowel: Visualized portions within the abdomen are  unremarkable. Vascular/Lymphatic: No pathologically enlarged lymph nodes identified. No abdominal aortic aneurysm demonstrated. Other:  None. Musculoskeletal: No suspicious bone lesions identified. IMPRESSION: 1. Choledocholithiasis with mild dilatation of the common bile duct. 2. Cholelithiasis without signal changes of acute cholecystitis. Electronically Signed   By: Ulyses Jarred M.D.   On: 09/30/2020 03:03   DG C-Arm 1-60 Min-No Report  Result Date: 09/30/2020 Fluoroscopy was utilized by the requesting physician.  No radiographic interpretation.    Scheduled Meds: . acetaminophen  1,000 mg Oral Q6H  . fentaNYL      . FLUoxetine  10 mg Oral Daily  . [START ON 10/02/2020] influenza vac split quadrivalent PF  0.5 mL Intramuscular Tomorrow-1000   Continuous Infusions: . sodium chloride 100 mL/hr at  10/01/20 0115     LOS: 1 day   Time spent: 30 minutes.  Tina Nimrod, MD [facility]  If 7PM-7AM, please contact night-coverage [email or web address]  10/01/2020, 1:56 PM   This record has been created using Systems analyst. Errors have been sought and corrected,but may not always be located. Such creation errors do not reflect on the standard of care.

## 2020-10-02 ENCOUNTER — Inpatient Hospital Stay: Payer: 59

## 2020-10-02 ENCOUNTER — Encounter: Payer: Self-pay | Admitting: Gastroenterology

## 2020-10-02 LAB — MAGNESIUM: Magnesium: 1.8 mg/dL (ref 1.7–2.4)

## 2020-10-02 LAB — CBC
HCT: 31.6 % — ABNORMAL LOW (ref 36.0–46.0)
Hemoglobin: 10.2 g/dL — ABNORMAL LOW (ref 12.0–15.0)
MCH: 30.4 pg (ref 26.0–34.0)
MCHC: 32.3 g/dL (ref 30.0–36.0)
MCV: 94 fL (ref 80.0–100.0)
Platelets: 151 10*3/uL (ref 150–400)
RBC: 3.36 MIL/uL — ABNORMAL LOW (ref 3.87–5.11)
RDW: 13.3 % (ref 11.5–15.5)
WBC: 7.4 10*3/uL (ref 4.0–10.5)
nRBC: 0 % (ref 0.0–0.2)

## 2020-10-02 LAB — COMPREHENSIVE METABOLIC PANEL
ALT: 655 U/L — ABNORMAL HIGH (ref 0–44)
AST: 275 U/L — ABNORMAL HIGH (ref 15–41)
Albumin: 3.1 g/dL — ABNORMAL LOW (ref 3.5–5.0)
Alkaline Phosphatase: 407 U/L — ABNORMAL HIGH (ref 38–126)
Anion gap: 6 (ref 5–15)
BUN: 6 mg/dL (ref 6–20)
CO2: 25 mmol/L (ref 22–32)
Calcium: 7.6 mg/dL — ABNORMAL LOW (ref 8.9–10.3)
Chloride: 107 mmol/L (ref 98–111)
Creatinine, Ser: 0.37 mg/dL — ABNORMAL LOW (ref 0.44–1.00)
GFR, Estimated: >60 mL/min (ref >60)
Glucose, Bld: 93 mg/dL (ref 70–99)
Potassium: 3.2 mmol/L — ABNORMAL LOW (ref 3.5–5.1)
Sodium: 138 mmol/L (ref 135–145)
Total Bilirubin: 3.3 mg/dL — ABNORMAL HIGH (ref 0.3–1.2)
Total Protein: 6 g/dL — ABNORMAL LOW (ref 6.5–8.1)

## 2020-10-02 LAB — PHOSPHORUS: Phosphorus: 1.6 mg/dL — ABNORMAL LOW (ref 2.5–4.6)

## 2020-10-02 MED ORDER — GADOBUTROL 1 MMOL/ML IV SOLN
7.0000 mL | Freq: Once | INTRAVENOUS | Status: AC | PRN
  Administered 2020-10-02: 7 mL via INTRAVENOUS

## 2020-10-02 MED ORDER — K PHOS MONO-SOD PHOS DI & MONO 155-852-130 MG PO TABS
500.0000 mg | ORAL_TABLET | ORAL | Status: AC
  Administered 2020-10-02 (×2): 500 mg via ORAL
  Filled 2020-10-02 (×2): qty 2

## 2020-10-02 MED ORDER — INFLUENZA VAC SPLIT QUAD 0.5 ML IM SUSY: 0.5000 mL | PREFILLED_SYRINGE | INTRAMUSCULAR | Status: DC | PRN

## 2020-10-02 MED ORDER — POTASSIUM CHLORIDE CRYS ER 20 MEQ PO TBCR
40.0000 meq | EXTENDED_RELEASE_TABLET | Freq: Once | ORAL | Status: AC
  Administered 2020-10-02: 40 meq via ORAL
  Filled 2020-10-02: qty 2

## 2020-10-02 NOTE — Discharge Instructions (Signed)
In addition to included general post-operative instructions for cholecystectomy (gallbladder removal),  Diet: Resume home diet. Recommend avoiding or limiting fatty foods for the first few days after surgery. If you do eat these, you may (or may not) notice diarrhea. This is expected while your body is adjusting to not having a gallbladder and resolves with time.   Activity: No heavy lifting >20 pounds (children, pets, laundry, garbage) for 4 weeks, but light activity and walking are encouraged. Do not drive or drink alcohol if taking narcotic pain medications or having pain that might distract from driving.  Wound care: 2 days after surgery (01/18), you may shower/get incision wet with soapy water and pat dry (do not rub incisions), but no baths or submerging incision underwater until follow-up.   Medications: Resume all home medications. For mild to moderate pain: acetaminophen (Tylenol) or ibuprofen/naproxen (if no kidney disease). Combining Tylenol with alcohol can substantially increase your risk of causing liver disease. Narcotic pain medications, if prescribed, can be used for severe pain, though may cause nausea, constipation, and drowsiness. Do not combine Tylenol and Percocet (or similar) within a 6 hour period as Percocet (and similar) contain(s) Tylenol. If you do not need the narcotic pain medication, you do not need to fill the prescription.  Call office [phone] / [phone]) at any time if any questions, worsening pain, fevers/chills, bleeding, drainage from incision site, or other concerns.

## 2020-10-02 NOTE — Progress Notes (Signed)
PROGRESS NOTE    Tina Wilson  [id number] DOB: [birth year] DOA: 09/30/2020 PCP: Lorrene Reid, PA-C   Brief Narrative: Taken from H&P Tina Wilson  is a 35 y.o. Caucasian female with a known history of asthma, and anxiety and depression and migraine, who presented to the emergency room with acute onset of right upper back pain that felt like stabbing knife about 7 to 8 days ago and has been intermittent since then with associated nausea and vomiting. She was found to have markedly elevated liver enzymes and T bili.  Ultrasound with cholelithiasis, no sonographic sign of cholecystitis.  Bile duct dilatation and concern for obstructive jaundice. GI and general surgery was consulted. Patient had MRCP which shows obstructing stone in ampulla, going for ERCP later today followed by cholecystectomy on 10/01/2020  Subjective: Patient was feeling better when seen during morning rounds and asking about possible discharge if she is able to tolerate diet.  Passing flatus, no BM yet. No nausea or vomiting.  Later developed right upper quadrant pain and dark-colored urine.  Assessment & Plan:   Active Problems:   Choledocholithiasis   Increased liver enzymes   RUQ abdominal pain  Obstructive jaundice secondary to obstructive choledocholithiasis. Elevated liver enzymes and T bili.  MRCP with concern of obstructive gallstone in ampulla.  Had ERCP with sphincterotomy on 09/30/2020 and laparoscopic cholecystectomy 10/01/2020. Hepatitis panel negative.  Liver enzymes improving. T bili with very mild worsening at 3.3 after improving to 3.1 yesterday. Patient developed very dark color urine-concern of bile leak. -Surgery ordered few more imaging. -Continue with postoperative pain management.  History of depression.  No acute concern. -Continue with home dose of Prozac  Objective: Vitals:   10/01/20 1724 10/01/20 2130 10/02/20 0734 10/02/20 1128  BP: 113/80 115/75 111/70  120/89  Pulse: (!) 107 95 84 80  Resp: 20 16 18 16   Temp: 98.5 F (36.9 C) 98.2 F (36.8 C) 98.5 F (36.9 C) 98 F (36.7 C)  TempSrc: Oral  Oral Oral  SpO2: 99% 100% 97% 99%  Weight:      Height:        Intake/Output Summary (Last 24 hours) at 10/02/2020 1403 Last data filed at 10/02/2020 1100 Gross per 24 hour  Intake 390 ml  Output --  Net 390 ml   Filed Weights   09/29/20 1418 09/30/20 1434  Weight: 74.8 kg 74.8 kg    Examination:  General.  Well-developed lady, in no acute distress. Pulmonary.  Lungs clear bilaterally, normal respiratory effort. CV.  Regular rate and rhythm, no JVD, rub or murmur. Abdomen.  Soft, nontender, nondistended, BS positive, 3 clean bandages. CNS.  Alert and oriented x3.  No focal neurologic deficit. Extremities.  No edema, no cyanosis, pulses intact and symmetrical. Psychiatry.  Judgment and insight appears normal.  DVT prophylaxis: SCDs.  Patient has thrombocytopenia Code Status: Full Family Communication: Discussed with patient. Disposition Plan:  Status is: Inpatient  Remains inpatient appropriate because:Inpatient level of care appropriate due to severity of illness   Dispo: The patient is from: Home              Anticipated d/c is to: Home              Anticipated d/c date is: 1 day.              Patient currently is not medically stable to d/c.   Consultants:   GI  General surgery  Procedures:  Antimicrobials:   Data  Reviewed: I have personally reviewed following labs and imaging studies  CBC: Recent Labs  Lab 09/26/20 2131 09/29/20 1422 09/30/20 0435 10/01/20 0418 10/02/20 0447  WBC 12.3* 7.5 6.6 5.9 7.4  HGB 11.7* 12.9 11.5* 12.0 10.2*  HCT 35.8* 38.8 35.1* 35.6* 31.6*  MCV 93.2 92.8 93.9 93.0 94.0  PLT 133* 146* 124* 158 573   Basic Metabolic Panel: Recent Labs  Lab 09/26/20 2131 09/29/20 1422 09/30/20 0435 10/01/20 0418 10/02/20 0447  NA 138 140 138 140 138  K 4.0 4.3 3.9 4.3 3.2*  CL 104 102  105 107 107  CO2 23 24 22 24 25   GLUCOSE 104* 98 73 95 93  BUN 10 9 8 7 6   CREATININE 0.46 0.33* 0.36* 0.46 0.37*  CALCIUM 9.2 9.2 8.2* 8.8* 7.6*  MG  --   --   --  2.0 1.8  PHOS  --   --   --  2.9 1.6*   GFR: Estimated Creatinine Clearance: 94.7 mL/min (A) (by C-G formula based on SCr of 0.37 mg/dL (L)). Liver Function Tests: Recent Labs  Lab 09/26/20 2131 09/29/20 1422 09/30/20 0435 10/01/20 0418 10/02/20 0447  AST 64* 495* 419* 270* 275*  ALT 48* 1,220* 1,076* 951* 655*  ALKPHOS 111 489* 467* 523* 407*  BILITOT 0.7 7.1* 7.1* 3.0* 3.3*  PROT 7.5 8.2* 7.2 7.0 6.0*  ALBUMIN 4.3 4.5 3.8 3.7 3.1*   Recent Labs  Lab 09/26/20 2131 09/29/20 1422 10/01/20 0418  LIPASE 28 28 29    No results for input(s): AMMONIA in the last 168 hours. Coagulation Profile: No results for input(s): INR, PROTIME in the last 168 hours. Cardiac Enzymes: No results for input(s): CKTOTAL, CKMB, CKMBINDEX, TROPONINI in the last 168 hours. BNP (last 3 results) No results for input(s): PROBNP in the last 8760 hours. HbA1C: No results for input(s): HGBA1C in the last 72 hours. CBG: No results for input(s): GLUCAP in the last 168 hours. Lipid Profile: No results for input(s): CHOL, HDL, LDLCALC, TRIG, CHOLHDL, LDLDIRECT in the last 72 hours. Thyroid Function Tests: No results for input(s): TSH, T4TOTAL, FREET4, T3FREE, THYROIDAB in the last 72 hours. Anemia Panel: No results for input(s): VITAMINB12, FOLATE, FERRITIN, TIBC, IRON, RETICCTPCT in the last 72 hours. Sepsis Labs: No results for input(s): PROCALCITON, LATICACIDVEN in the last 168 hours.  Recent Results (from the past 240 hour(s))  Resp Panel by RT-PCR (Flu A&B, Covid) Nasopharyngeal Swab     Status: None   Collection Time: 09/30/20  3:23 AM   Specimen: Nasopharyngeal Swab; Nasopharyngeal(NP) swabs in vial transport medium  Result Value Ref Range Status   SARS Coronavirus 2 by RT PCR NEGATIVE NEGATIVE Final    Comment:  (NOTE) SARS-CoV-2 target nucleic acids are NOT DETECTED.  The SARS-CoV-2 RNA is generally detectable in upper respiratory specimens during the acute phase of infection. The lowest concentration of SARS-CoV-2 viral copies this assay can detect is 138 copies/mL. A negative result does not preclude SARS-Cov-2 infection and should not be used as the sole basis for treatment or other patient management decisions. A negative result may occur with  improper specimen collection/handling, submission of specimen other than nasopharyngeal swab, presence of viral mutation(s) within the areas targeted by this assay, and inadequate number of viral copies(<138 copies/mL). A negative result must be combined with clinical observations, patient history, and epidemiological information. The expected result is Negative.  Fact Sheet for Patients:  EntrepreneurPulse.com.au  Fact Sheet for Healthcare Providers:  IncredibleEmployment.be  This test is no  t yet approved or cleared by the [place] and  has been authorized for detection and/or diagnosis of SARS-CoV-2 by FDA under an Emergency Use Authorization (EUA). This EUA will remain  in effect (meaning this test can be used) for the duration of the COVID-19 declaration under Section 564(b)(1) of the Act, 21 U.S.C.section 360bbb-3(b)(1), unless the authorization is terminated  or revoked sooner.       Influenza A by PCR NEGATIVE NEGATIVE Final   Influenza B by PCR NEGATIVE NEGATIVE Final    Comment: (NOTE) The Xpert Xpress SARS-CoV-2/FLU/RSV plus assay is intended as an aid in the diagnosis of influenza from Nasopharyngeal swab specimens and should not be used as a sole basis for treatment. Nasal washings and aspirates are unacceptable for Xpert Xpress SARS-CoV-2/FLU/RSV testing.  Fact Sheet for Patients: EntrepreneurPulse.com.au  Fact Sheet for Healthcare  Providers: IncredibleEmployment.be  This test is not yet approved or cleared by the [place] FDA and has been authorized for detection and/or diagnosis of SARS-CoV-2 by FDA under an Emergency Use Authorization (EUA). This EUA will remain in effect (meaning this test can be used) for the duration of the COVID-19 declaration under Section 564(b)(1) of the Act, 21 U.S.C. section 360bbb-3(b)(1), unless the authorization is terminated or revoked.  Performed at [facility], [address] [place], [place] [address]   Surgical PCR screen     Status: None   Collection Time: 09/30/20 10:57 PM   Specimen: Nasal Mucosa; Nasal Swab  Result Value Ref Range Status   MRSA, PCR NEGATIVE NEGATIVE Final   Staphylococcus aureus NEGATIVE NEGATIVE Final    Comment: (NOTE) The Xpert SA Assay (FDA approved for NASAL specimens in patients 48 years of age and older), is one component of a comprehensive surveillance program. It is not intended to diagnose infection nor to guide or monitor treatment. Performed at [facility], [address] [place],  [address]      Radiology Studies: DG C-Arm 1-60 Min-No Report  Result Date: 09/30/2020 Fluoroscopy was utilized by the requesting physician.  No radiographic interpretation.    Scheduled Meds: . acetaminophen  1,000 mg Oral Q6H  . FLUoxetine  10 mg Oral Daily  . potassium chloride  40 mEq Oral Once   Continuous Infusions: . sodium chloride 100 mL/hr at 10/02/20 1209     LOS: 2 days   Time spent: 30 minutes.  Lorella Nimrod, MD [facility]  If 7PM-7AM, please contact night-coverage [email or web address]  10/02/2020, 2:03 PM   This record has been created using Systems analyst. Errors have been sought and corrected,but may not always be located. Such creation errors do not reflect on the standard of care.

## 2020-10-02 NOTE — Progress Notes (Addendum)
[place] Hospital Day(s): 2.   Post op day(s): 1 Day Post-Op.   Interval History:  Patient seen and examined No acute events or new complaints overnight.  Patient reports she is doing okay, no fever, chills, nausea, emesis She remains without leukocytosis, WBC normal at 7.4k Renal function remains normal, sCr - 0.37 She does have mild hypokalemia to 3.2 this morning Hyperbilirubinemia slightly worse to 3.3 She has tolerated CLD post-operatively  Vital signs in last 24 hours: [min-max] current  Temp:  [97.8 F (36.6 C)-99.1 F (37.3 C)] 98.2 F (36.8 C) (01/16 2130) Pulse Rate:  [78-107] 95 (01/16 2130) Resp:  [13-20] 16 (01/16 2130) BP: (113-126)/(72-82) 115/75 (01/16 2130) SpO2:  [97 %-100 %] 100 % (01/16 2130)     Height: 5\' 2"  (157.5 cm) Weight: 74.8 kg BMI (Calculated): 30.15   Intake/Output last 2 shifts:  01/16 0701 - 01/17 0700 In: 1305 [P.O.:480; I.V.:800; IV Piggyback:25] Out: 10 [Blood:10]   Physical Exam:  Constitutional: alert, cooperative and no distress  Respiratory: breathing non-labored at rest  Cardiovascular: regular rate and sinus rhythm  Gastrointestinal: Soft, incisional soreness, and non-distended, no rebound/guarding Integumentary: Laparoscopic incisions are CDI with steri-strips, no erythema or drainage   Labs:  CBC Latest Ref Rng & Units 10/02/2020 10/01/2020 09/30/2020  WBC 4.0 - 10.5 K/uL 7.4 5.9 6.6  Hemoglobin 12.0 - 15.0 g/dL 10.2(L) 12.0 11.5(L)  Hematocrit 36.0 - 46.0 % 31.6(L) 35.6(L) 35.1(L)  Platelets 150 - 400 K/uL 151 158 124(L)   CMP Latest Ref Rng & Units 10/02/2020 10/01/2020 09/30/2020  Glucose 70 - 99 mg/dL 93 95 73  BUN 6 - 20 mg/dL 6 7 8   Creatinine 0.44 - 1.00 mg/dL 0.37(L) 0.46 0.36(L)  Sodium 135 - 145 mmol/L 138 140 138  Potassium 3.5 - 5.1 mmol/L 3.2(L) 4.3 3.9  Chloride 98 - 111 mmol/L 107 107 105  CO2 22 - 32 mmol/L 25 24 22   Calcium 8.9 - 10.3 mg/dL 7.6(L) 8.8(L) 8.2(L)   Total Protein 6.5 - 8.1 g/dL 6.0(L) 7.0 7.2  Total Bilirubin 0.3 - 1.2 mg/dL 3.3(H) 3.0(H) 7.1(H)  Alkaline Phos 38 - 126 U/L 407(H) 523(H) 467(H)  AST 15 - 41 U/L 275(H) 270(H) 419(H)  ALT 0 - 44 U/L 655(H) 951(H) 1,076(H)     Imaging studies: No new pertinent imaging studies   Assessment/Plan:  35 y.o. female with slight worsening of hyperbilirubinemia this morning 1 Day Post-Op s/p robotic assisted laparoscopic cholecystectomy for choledocholithiasis.   - Okay to ADAT; dietary recommendation at discharge reviewed  - Monitor abdominal examination  - Pain control prn; antiemetics prn  - Replete K+; monitor  - Monitor hyperbilirubinemia; slightly worse  - Further management per primary service      - Discharge planning: Prefer 24 hours observation to ensure no worsening of her hyperbilirubinemia   All of the above findings and recommendations were discussed with the patient, and the medical team, and all of patient's questions were answered to her expressed satisfaction.  -- Edison Simon, PA-C [facility] 10/02/2020, 7:30 AM [phone] M-F: 7am - 4pm  I saw and evaluated the patient.  I agree with the above documentation, exam, and plan, which I have edited where appropriate. Fredirick Maudlin  8:55 AM

## 2020-10-02 NOTE — Plan of Care (Addendum)
Pt Tina Wilson. Calm and cooperative and able to voice her needs. Frequent requests for pain meds to relieve RUQ pain. IVF at 100 l/hr. MRCP completed today due to c/o of dark urine and sharp pain to RUQ/lower right rib area. Pt independent and tolerating Full liquid diet ordered after noted no stone reported on MRCP. Pt had large loose brown BM. Labs being monitored closely and k+ being replaced per MD orders. Safety measures in place. Will continue to monitor.  Problem: Education: Goal: Knowledge of General Education information will improve Description: Including pain rating scale, medication(s)/side effects and non-pharmacologic comfort measures Outcome: Progressing   Problem: Health Behavior/Discharge Planning: Goal: Ability to manage health-related needs will improve Outcome: Progressing   Problem: Clinical Measurements: Goal: Ability to maintain clinical measurements within normal limits will improve Outcome: Progressing Goal: Will remain free from infection Outcome: Progressing Goal: Diagnostic test results will improve Outcome: Progressing Goal: Respiratory complications will improve Outcome: Progressing Goal: Cardiovascular complication will be avoided Outcome: Progressing   Problem: Activity: Goal: Risk for activity intolerance will decrease Outcome: Progressing   Problem: Nutrition: Goal: Adequate nutrition will be maintained Outcome: Progressing   Problem: Coping: Goal: Level of anxiety will decrease Outcome: Progressing   Problem: Elimination: Goal: Will not experience complications related to bowel motility Outcome: Progressing Goal: Will not experience complications related to urinary retention Outcome: Progressing   Problem: Pain Managment: Goal: General experience of comfort will improve Outcome: Progressing   Problem: Safety: Goal: Ability to remain free from injury will improve Outcome: Progressing   Problem: Skin Integrity: Goal: Risk for impaired skin  integrity will decrease Outcome: Progressing

## 2020-10-03 LAB — SURGICAL PATHOLOGY

## 2020-10-03 LAB — HEPATIC FUNCTION PANEL
ALT: 485 U/L — ABNORMAL HIGH (ref 0–44)
AST: 78 U/L — ABNORMAL HIGH (ref 15–41)
Albumin: 3.3 g/dL — ABNORMAL LOW (ref 3.5–5.0)
Alkaline Phosphatase: 342 U/L — ABNORMAL HIGH (ref 38–126)
Bilirubin, Direct: 0.7 mg/dL — ABNORMAL HIGH (ref 0.0–0.2)
Indirect Bilirubin: 1 mg/dL — ABNORMAL HIGH (ref 0.3–0.9)
Total Bilirubin: 1.7 mg/dL — ABNORMAL HIGH (ref 0.3–1.2)
Total Protein: 6.1 g/dL — ABNORMAL LOW (ref 6.5–8.1)

## 2020-10-03 NOTE — Plan of Care (Signed)
  Problem: Education: Goal: Knowledge of General Education information will improve Description: Including pain rating scale, medication(s)/side effects and non-pharmacologic comfort measures 10/03/2020 1144 by Cristela Blue, RN Outcome: Adequate for Discharge 10/03/2020 1143 by Cristela Blue, RN Outcome: Progressing   Problem: Health Behavior/Discharge Planning: Goal: Ability to manage health-related needs will improve 10/03/2020 1144 by Cristela Blue, RN Outcome: Adequate for Discharge 10/03/2020 1143 by Cristela Blue, RN Outcome: Progressing   Problem: Clinical Measurements: Goal: Ability to maintain clinical measurements within normal limits will improve 10/03/2020 1144 by Cristela Blue, RN Outcome: Adequate for Discharge 10/03/2020 1143 by Cristela Blue, RN Outcome: Progressing Goal: Will remain free from infection 10/03/2020 1144 by Cristela Blue, RN Outcome: Adequate for Discharge 10/03/2020 1143 by Cristela Blue, RN Outcome: Progressing Goal: Diagnostic test results will improve 10/03/2020 1144 by Cristela Blue, RN Outcome: Adequate for Discharge 10/03/2020 1143 by Cristela Blue, RN Outcome: Progressing Goal: Respiratory complications will improve 10/03/2020 1144 by Cristela Blue, RN Outcome: Adequate for Discharge 10/03/2020 1143 by Cristela Blue, RN Outcome: Progressing Goal: Cardiovascular complication will be avoided 10/03/2020 1144 by Cristela Blue, RN Outcome: Adequate for Discharge 10/03/2020 1143 by Cristela Blue, RN Outcome: Progressing   Problem: Activity: Goal: Risk for activity intolerance will decrease 10/03/2020 1144 by Cristela Blue, RN Outcome: Adequate for Discharge 10/03/2020 1143 by Cristela Blue, RN Outcome: Progressing   Problem: Nutrition: Goal: Adequate nutrition will be maintained 10/03/2020 1144 by Cristela Blue, RN Outcome: Adequate for Discharge 10/03/2020 1143 by Cristela Blue, RN Outcome: Progressing   Problem:  Coping: Goal: Level of anxiety will decrease 10/03/2020 1144 by Cristela Blue, RN Outcome: Adequate for Discharge 10/03/2020 1143 by Cristela Blue, RN Outcome: Progressing   Problem: Elimination: Goal: Will not experience complications related to bowel motility 10/03/2020 1144 by Cristela Blue, RN Outcome: Adequate for Discharge 10/03/2020 1143 by Cristela Blue, RN Outcome: Progressing Goal: Will not experience complications related to urinary retention 10/03/2020 1144 by Cristela Blue, RN Outcome: Adequate for Discharge 10/03/2020 1143 by Cristela Blue, RN Outcome: Progressing   Problem: Pain Managment: Goal: General experience of comfort will improve 10/03/2020 1144 by Cristela Blue, RN Outcome: Adequate for Discharge 10/03/2020 1143 by Cristela Blue, RN Outcome: Progressing   Problem: Safety: Goal: Ability to remain free from injury will improve 10/03/2020 1144 by Cristela Blue, RN Outcome: Adequate for Discharge 10/03/2020 1143 by Cristela Blue, RN Outcome: Progressing   Problem: Skin Integrity: Goal: Risk for impaired skin integrity will decrease 10/03/2020 1144 by Cristela Blue, RN Outcome: Adequate for Discharge 10/03/2020 1143 by Cristela Blue, RN Outcome: Progressing

## 2020-10-03 NOTE — Discharge Summary (Signed)
Physician Discharge Summary  Tina Wilson GDJ:[id number] DOB: 12/22/1985 DOA: 09/30/2020  PCP: Lorrene Reid, PA-C  Admit date: 09/30/2020 Discharge date: 10/03/2020  Admitted From: Home  Disposition:  Home  Recommendations for Outpatient Follow-up:  1. Follow up with PCP in 1-2 weeks 2. Please obtain BMP/CBC in one week 3. Please follow up on the following pending results:None  Home Health: No Equipment/Devices: None Discharge Condition: Stable CODE STATUS: Full Diet recommendation: Heart Healthy  Brief/Interim Summary: AudreyDevore Amorosois a34 y.o.Caucasian femalewith a known history ofasthma, and anxiety and depression and migraine, who presented to the emergency room with acute onset of right upper back pain that felt like stabbing knife about 7 to 8 days ago and has been intermittent since then with associated nausea and vomiting. She was found to have markedly elevated liver enzymes and T bili.  Ultrasound with cholelithiasis, no sonographic sign of cholecystitis.  Bile duct dilatation and concern for obstructive jaundice. GI and general surgery was consulted. Patient had MRCP which shows obstructing stone in ampulla, followed by ERCP with sphincterotomy on 09/30/2020. Patient had her laparoscopic cholecystectomy done on 10/01/2020, tolerated the procedure well. Repeat MRCP next day for worsening pain and dark-colored urine for concern of bile leak, it was negative for any more gallstones. Symptoms improving with improvement in T bili and liver enzymes.  Per surgery patient was stable for discharge and will follow-up with them as an outpatient.  She will continue her home dose of Prozac and follow-up with her primary care provider.  Discharge Diagnoses:  Active Problems:   Choledocholithiasis   Increased liver enzymes   Right upper quadrant abdominal pain  Discharge Instructions  Discharge Instructions    Diet - low sodium heart healthy   Complete by:  As directed    Discharge instructions   Complete by: As directed    It was pleasure taking care of you. Keep yourself well-hydrated and follow-up with your surgeon. You can use tramadol or naproxen as needed for pain. Follow the directions from your surgeon regarding your activity and weight lifting.   Discharge instructions   Complete by: As directed    It was pleasure taking care of you. Keep yourself well-hydrated and advance your diet as tolerated. Follow the directions of your surgeon and follow-up with them in 2 weeks. Continue taking naproxen or tramadol as needed for pain.   Increase activity slowly   Complete by: As directed    Increase activity slowly   Complete by: As directed      Allergies as of 10/03/2020   No Known Allergies     Medication List    TAKE these medications   FLUoxetine 20 MG tablet Commonly known as: PROZAC Take 0.5 (10mg ) tablet once daily for 7 days. Then take 1 tablet once daily.   naproxen 500 MG tablet Commonly known as: NAPROSYN Take 1 tablet (500 mg total) by mouth 2 (two) times daily.   ondansetron 4 MG disintegrating tablet Commonly known as: Zofran ODT Take 1 tablet (4 mg total) by mouth every 8 (eight) hours as needed for nausea or vomiting.   tiZANidine 4 MG tablet Commonly known as: Zanaflex Take 1 tablet (4 mg total) by mouth every 6 (six) hours as needed for muscle spasms.   traMADol 50 MG tablet Commonly known as: ULTRAM Take 1 tablet (50 mg total) by mouth every 6 (six) hours as needed for severe pain.       Follow-up Information    Tylene Fantasia,  PA-C. Schedule an appointment as soon as possible for a visit in 2 week(s).   Specialty: Physician Assistant Why: s/p cholecystectomy  Contact information: [address] [place] [address] [phone]        Lorrene Reid, PA-C. Schedule an appointment as soon as possible for a visit.   Specialty: Physician Assistant Contact information: [place] [address] [phone]              No Known Allergies  Consultations:  GI  Surgery  Procedures/Studies: DG Ribs Unilateral W/Chest Right  Result Date: 09/27/2020 CLINICAL DATA:  Right lower rib pain for 5 days after lifting injury. EXAM: RIGHT RIBS AND CHEST - 3+ VIEW COMPARISON:  None. FINDINGS: A metallic BB is visible posteromedially in the lower right chest, indicating the area of pain. No evidence of rib fracture or focal rib lesion. The heart size and mediastinal contours are normal. The lungs are clear. There is no pleural effusion or pneumothorax. IMPRESSION: No evidence of rib fracture, pleural effusion or pneumothorax. Electronically Signed   By: Richardean Sale M.D.   On: 09/27/2020 09:26   MR ABDOMEN MRCP WO CONTRAST  Result Date: 09/30/2020 CLINICAL DATA:  Right upper quadrant pain and vomiting EXAM: MRI ABDOMEN WITHOUT CONTRAST  (INCLUDING MRCP) TECHNIQUE: Multiplanar multisequence MR imaging of the abdomen was performed. Heavily T2-weighted images of the biliary and pancreatic ducts were obtained, and three-dimensional MRCP images were rendered by post processing. COMPARISON:  None. FINDINGS: Lower chest: No acute findings. Hepatobiliary: No mass or other parenchymal abnormality identified. The common bile duct is dilated measuring 7 mm in diameter. There are multiple stones within the gallbladder. There is no signal abnormality of the gallbladder fossa. There is a stone within the distal common bile duct at the level of the ampulla. Pancreas: No mass, inflammatory changes, or other parenchymal abnormality identified. Spleen:  Within normal limits in size and appearance. Adrenals/Urinary Tract: No masses identified. No evidence of hydronephrosis. Stomach/Bowel: Visualized portions within the abdomen are unremarkable. Vascular/Lymphatic: No pathologically enlarged lymph nodes identified. No abdominal aortic aneurysm demonstrated. Other:   None. Musculoskeletal: No suspicious bone lesions identified. IMPRESSION: 1. Choledocholithiasis with mild dilatation of the common bile duct. 2. Cholelithiasis without signal changes of acute cholecystitis. Electronically Signed   By: Ulyses Jarred M.D.   On: 09/30/2020 03:03   MR 3D Recon At Scanner  Result Date: 10/02/2020 CLINICAL DATA:  Jaundice. Worsening hyperbilirubinemia. One day postop from laparoscopic cholecystectomy. EXAM: MRI ABDOMEN WITHOUT AND WITH CONTRAST (INCLUDING MRCP) TECHNIQUE: Multiplanar multisequence MR imaging of the abdomen was performed both before and after the administration of intravenous contrast. Heavily T2-weighted images of the biliary and pancreatic ducts were obtained, and three-dimensional MRCP images were rendered by post processing. CONTRAST:  68mL GADAVIST GADOBUTROL 1 MMOL/ML IV SOLN COMPARISON:  09/30/2020 FINDINGS: Lower chest: Mild dependent bibasilar atelectasis. Hepatobiliary: No hepatic masses identified. A tiny sub-cm cyst is seen in segment 3 of the left lobe. Postop changes are seen from recent cholecystectomy. No evidence of abnormal fluid collections. No evidence of biliary ductal dilatation, with common bile duct measuring 3 mm. No evidence of choledocholithiasis. Pancreas: No mass or inflammatory changes. No evidence of pancreatic ductal dilatation. Spleen:  Within normal limits in size and appearance. Adrenals/Urinary Tract: No masses identified. No evidence of hydronephrosis. Stomach/Bowel: Visualized portion unremarkable. Vascular/Lymphatic: No pathologically enlarged lymph nodes identified. No abdominal aortic aneurysm. Other:  None. Musculoskeletal:  No suspicious bone lesions identified. IMPRESSION:  Expected postop changes from recent cholecystectomy. No evidence of postop fluid collections, biliary ductal dilatation, or other complication. Electronically Signed   By: Marlaine Hind M.D.   On: 10/02/2020 13:54   MR 3D Recon At Scanner  Result Date:  09/30/2020 CLINICAL DATA:  Right upper quadrant pain and vomiting EXAM: MRI ABDOMEN WITHOUT CONTRAST  (INCLUDING MRCP) TECHNIQUE: Multiplanar multisequence MR imaging of the abdomen was performed. Heavily T2-weighted images of the biliary and pancreatic ducts were obtained, and three-dimensional MRCP images were rendered by post processing. COMPARISON:  None. FINDINGS: Lower chest: No acute findings. Hepatobiliary: No mass or other parenchymal abnormality identified. The common bile duct is dilated measuring 7 mm in diameter. There are multiple stones within the gallbladder. There is no signal abnormality of the gallbladder fossa. There is a stone within the distal common bile duct at the level of the ampulla. Pancreas: No mass, inflammatory changes, or other parenchymal abnormality identified. Spleen:  Within normal limits in size and appearance. Adrenals/Urinary Tract: No masses identified. No evidence of hydronephrosis. Stomach/Bowel: Visualized portions within the abdomen are unremarkable. Vascular/Lymphatic: No pathologically enlarged lymph nodes identified. No abdominal aortic aneurysm demonstrated. Other:  None. Musculoskeletal: No suspicious bone lesions identified. IMPRESSION: 1. Choledocholithiasis with mild dilatation of the common bile duct. 2. Cholelithiasis without signal changes of acute cholecystitis. Electronically Signed   By: Ulyses Jarred M.D.   On: 09/30/2020 03:03   DG C-Arm 1-60 Min-No Report  Result Date: 09/30/2020 Fluoroscopy was utilized by the requesting physician.  No radiographic interpretation.   MR ABDOMEN MRCP W WO CONTAST  Result Date: 10/02/2020 CLINICAL DATA:  Jaundice. Worsening hyperbilirubinemia. One day postop from laparoscopic cholecystectomy. EXAM: MRI ABDOMEN WITHOUT AND WITH CONTRAST (INCLUDING MRCP) TECHNIQUE: Multiplanar multisequence MR imaging of the abdomen was performed both before and after the administration of intravenous contrast. Heavily T2-weighted  images of the biliary and pancreatic ducts were obtained, and three-dimensional MRCP images were rendered by post processing. CONTRAST:  33mL GADAVIST GADOBUTROL 1 MMOL/ML IV SOLN COMPARISON:  09/30/2020 FINDINGS: Lower chest: Mild dependent bibasilar atelectasis. Hepatobiliary: No hepatic masses identified. A tiny sub-cm cyst is seen in segment 3 of the left lobe. Postop changes are seen from recent cholecystectomy. No evidence of abnormal fluid collections. No evidence of biliary ductal dilatation, with common bile duct measuring 3 mm. No evidence of choledocholithiasis. Pancreas: No mass or inflammatory changes. No evidence of pancreatic ductal dilatation. Spleen:  Within normal limits in size and appearance. Adrenals/Urinary Tract: No masses identified. No evidence of hydronephrosis. Stomach/Bowel: Visualized portion unremarkable. Vascular/Lymphatic: No pathologically enlarged lymph nodes identified. No abdominal aortic aneurysm. Other:  None. Musculoskeletal:  No suspicious bone lesions identified. IMPRESSION: Expected postop changes from recent cholecystectomy. No evidence of postop fluid collections, biliary ductal dilatation, or other complication. Electronically Signed   By: Marlaine Hind M.D.   On: 10/02/2020 13:54   US Abdomen Limited RUQ (LIVER/GB)  Result Date: 09/29/2020 CLINICAL DATA:  Right upper quadrant pain for 6 days. EXAM: ULTRASOUND ABDOMEN LIMITED RIGHT UPPER QUADRANT COMPARISON:  None. FINDINGS: Gallbladder: Numerous small gallstones are seen measuring up to 5 mm. No evidence of gallbladder wall thickening or pericholecystic fluid. Common bile duct: Diameter: 7 mm, which is mildly dilated. Probable mild dilatation of intrahepatic bile ducts also noted. Liver: No focal lesion identified. Within normal limits in parenchymal echogenicity. Portal vein is patent on color Doppler imaging with normal direction of blood flow towards the liver. Other: None. IMPRESSION: Cholelithiasis, without  definite sonographic signs of  cholecystitis. Mild biliary ductal dilatation, with common bile duct measuring 7 mm. Suggest correlation with liver function tests, and consider abdomen MRI/MRCP without and with contrast for further evaluation if clinically warranted. Electronically Signed   By: Marlaine Hind M.D.   On: 09/29/2020 09:14     Subjective: Patient was feeling better when seen today.  Ready to go home.  Continues to have mild soreness around incisions.  No nausea or vomiting.  Had bowel movement and tolerating diet well.  Discharge Exam: Vitals:   10/03/20 0455 10/03/20 0800  BP: 116/81 122/87  Pulse: 75 65  Resp: 16 16  Temp: 98.3 F (36.8 C) 97.8 F (36.6 C)  SpO2: 98% 97%   Vitals:   10/02/20 1648 10/02/20 2104 10/03/20 0455 10/03/20 0800  BP: 110/88 123/85 116/81 122/87  Pulse: 82 69 75 65  Resp: 18 16 16 16   Temp: 98 F (36.7 C) 98.2 F (36.8 C) 98.3 F (36.8 C) 97.8 F (36.6 C)  TempSrc: Oral  Oral Oral  SpO2: 96% 100% 98% 97%  Weight:      Height:        General: Pt is alert, awake, not in acute distress Cardiovascular: RRR, S1/S2 +, no rubs, no gallops Respiratory: CTA bilaterally, no wheezing, no rhonchi Abdominal: Soft, NT, ND, bowel sounds + Extremities: no edema, no cyanosis   The results of significant diagnostics from this hospitalization (including imaging, microbiology, ancillary and laboratory) are listed below for reference.    Microbiology: Recent Results (from the past 240 hour(s))  Resp Panel by RT-PCR (Flu A&B, Covid) Nasopharyngeal Swab     Status: None   Collection Time: 09/30/20  3:23 AM   Specimen: Nasopharyngeal Swab; Nasopharyngeal(NP) swabs in vial transport medium  Result Value Ref Range Status   SARS Coronavirus 2 by RT PCR NEGATIVE NEGATIVE Final    Comment: (NOTE) SARS-CoV-2 target nucleic acids are NOT DETECTED.  The SARS-CoV-2 RNA is generally detectable in upper respiratory specimens during the acute phase of infection.  The lowest concentration of SARS-CoV-2 viral copies this assay can detect is 138 copies/mL. A negative result does not preclude SARS-Cov-2 infection and should not be used as the sole basis for treatment or other patient management decisions. A negative result may occur with  improper specimen collection/handling, submission of specimen other than nasopharyngeal swab, presence of viral mutation(s) within the areas targeted by this assay, and inadequate number of viral copies(<138 copies/mL). A negative result must be combined with clinical observations, patient history, and epidemiological information. The expected result is Negative.  Fact Sheet for Patients:  EntrepreneurPulse.com.au  Fact Sheet for Healthcare Providers:  IncredibleEmployment.be  This test is no t yet approved or cleared by the [place] FDA and  has been authorized for detection and/or diagnosis of SARS-CoV-2 by FDA under an Emergency Use Authorization (EUA). This EUA will remain  in effect (meaning this test can be used) for the duration of the COVID-19 declaration under Section 564(b)(1) of the Act, 21 U.S.C.section 360bbb-3(b)(1), unless the authorization is terminated  or revoked sooner.       Influenza A by PCR NEGATIVE NEGATIVE Final   Influenza B by PCR NEGATIVE NEGATIVE Final    Comment: (NOTE) The Xpert Xpress SARS-CoV-2/FLU/RSV plus assay is intended as an aid in the diagnosis of influenza from Nasopharyngeal swab specimens and should not be used as a sole basis for treatment. Nasal washings and aspirates are unacceptable for Xpert Xpress SARS-CoV-2/FLU/RSV testing.  Fact Sheet for Patients: EntrepreneurPulse.com.au  Fact Sheet for Healthcare Providers: IncredibleEmployment.be  This test is not yet approved or cleared by the [place] FDA and has been authorized for detection and/or diagnosis of SARS-CoV-2 by FDA  under an Emergency Use Authorization (EUA). This EUA will remain in effect (meaning this test can be used) for the duration of the COVID-19 declaration under Section 564(b)(1) of the Act, 21 U.S.C. section 360bbb-3(b)(1), unless the authorization is terminated or revoked.  Performed at [facility], [address], [place], [place] [address]   Surgical PCR screen     Status: None   Collection Time: 09/30/20 10:57 PM   Specimen: Nasal Mucosa; Nasal Swab  Result Value Ref Range Status   MRSA, PCR NEGATIVE NEGATIVE Final   Staphylococcus aureus NEGATIVE NEGATIVE Final    Comment: (NOTE) The Xpert SA Assay (FDA approved for NASAL specimens in patients 57 years of age and older), is one component of a comprehensive surveillance program. It is not intended to diagnose infection nor to guide or monitor treatment. Performed at [facility], [address] [place], [place] [address]      Labs: BNP (last 3 results) No results for input(s): BNP in the last 8760 hours. Basic Metabolic Panel: Recent Labs  Lab 09/26/20 2131 09/29/20 1422 09/30/20 0435 10/01/20 0418 10/02/20 0447  NA 138 140 138 140 138  K 4.0 4.3 3.9 4.3 3.2*  CL 104 102 105 107 107  CO2 23 24 22 24 25   GLUCOSE 104* 98 73 95 93  BUN 10 9 8 7 6   CREATININE 0.46 0.33* 0.36* 0.46 0.37*  CALCIUM 9.2 9.2 8.2* 8.8* 7.6*  MG  --   --   --  2.0 1.8  PHOS  --   --   --  2.9 1.6*   Liver Function Tests: Recent Labs  Lab 09/29/20 1422 09/30/20 0435 10/01/20 0418 10/02/20 0447 10/03/20 0514  AST 495* 419* 270* 275* 78*  ALT 1,220* 1,076* 951* 655* 485*  ALKPHOS 489* 467* 523* 407* 342*  BILITOT 7.1* 7.1* 3.0* 3.3* 1.7*  PROT 8.2* 7.2 7.0 6.0* 6.1*  ALBUMIN 4.5 3.8 3.7 3.1* 3.3*   Recent Labs  Lab 09/26/20 2131 09/29/20 1422 10/01/20 0418  LIPASE 28 28 29    No results for input(s): AMMONIA in the last 168 hours. CBC: Recent Labs  Lab 09/26/20 2131 09/29/20 1422 09/30/20 0435  10/01/20 0418 10/02/20 0447  WBC 12.3* 7.5 6.6 5.9 7.4  HGB 11.7* 12.9 11.5* 12.0 10.2*  HCT 35.8* 38.8 35.1* 35.6* 31.6*  MCV 93.2 92.8 93.9 93.0 94.0  PLT 133* 146* 124* 158 151   Cardiac Enzymes: No results for input(s): CKTOTAL, CKMB, CKMBINDEX, TROPONINI in the last 168 hours. BNP: Invalid input(s): POCBNP CBG: No results for input(s): GLUCAP in the last 168 hours. D-Dimer No results for input(s): DDIMER in the last 72 hours. Hgb A1c No results for input(s): HGBA1C in the last 72 hours. Lipid Profile No results for input(s): CHOL, HDL, LDLCALC, TRIG, CHOLHDL, LDLDIRECT in the last 72 hours. Thyroid function studies No results for input(s): TSH, T4TOTAL, T3FREE, THYROIDAB in the last 72 hours.  Invalid input(s): FREET3 Anemia work up No results for input(s): VITAMINB12, FOLATE, FERRITIN, TIBC, IRON, RETICCTPCT in the last 72 hours. Urinalysis    Component Value Date/Time   BILIRUBINUR moderate (A) 09/27/2020 0913   KETONESUR negative 09/27/2020 0913   PROTEINUR =30 (A) 09/27/2020 0913   UROBILINOGEN 1.0 09/27/2020 0913   NITRITE Negative 09/27/2020 0913   LEUKOCYTESUR  Negative 09/27/2020 0913   Sepsis Labs Invalid input(s): PROCALCITONIN,  WBC,  LACTICIDVEN Microbiology Recent Results (from the past 240 hour(s))  Resp Panel by RT-PCR (Flu A&B, Covid) Nasopharyngeal Swab     Status: None   Collection Time: 09/30/20  3:23 AM   Specimen: Nasopharyngeal Swab; Nasopharyngeal(NP) swabs in vial transport medium  Result Value Ref Range Status   SARS Coronavirus 2 by RT PCR NEGATIVE NEGATIVE Final    Comment: (NOTE) SARS-CoV-2 target nucleic acids are NOT DETECTED.  The SARS-CoV-2 RNA is generally detectable in upper respiratory specimens during the acute phase of infection. The lowest concentration of SARS-CoV-2 viral copies this assay can detect is 138 copies/mL. A negative result does not preclude SARS-Cov-2 infection and should not be used as the sole basis for  treatment or other patient management decisions. A negative result may occur with  improper specimen collection/handling, submission of specimen other than nasopharyngeal swab, presence of viral mutation(s) within the areas targeted by this assay, and inadequate number of viral copies(<138 copies/mL). A negative result must be combined with clinical observations, patient history, and epidemiological information. The expected result is Negative.  Fact Sheet for Patients:  EntrepreneurPulse.com.au  Fact Sheet for Healthcare Providers:  IncredibleEmployment.be  This test is no t yet approved or cleared by the [place] FDA and  has been authorized for detection and/or diagnosis of SARS-CoV-2 by FDA under an Emergency Use Authorization (EUA). This EUA will remain  in effect (meaning this test can be used) for the duration of the COVID-19 declaration under Section 564(b)(1) of the Act, 21 U.S.C.section 360bbb-3(b)(1), unless the authorization is terminated  or revoked sooner.       Influenza A by PCR NEGATIVE NEGATIVE Final   Influenza B by PCR NEGATIVE NEGATIVE Final    Comment: (NOTE) The Xpert Xpress SARS-CoV-2/FLU/RSV plus assay is intended as an aid in the diagnosis of influenza from Nasopharyngeal swab specimens and should not be used as a sole basis for treatment. Nasal washings and aspirates are unacceptable for Xpert Xpress SARS-CoV-2/FLU/RSV testing.  Fact Sheet for Patients: EntrepreneurPulse.com.au  Fact Sheet for Healthcare Providers: IncredibleEmployment.be  This test is not yet approved or cleared by the [place] FDA and has been authorized for detection and/or diagnosis of SARS-CoV-2 by FDA under an Emergency Use Authorization (EUA). This EUA will remain in effect (meaning this test can be used) for the duration of the COVID-19 declaration under Section 564(b)(1) of the Act, 21  U.S.C. section 360bbb-3(b)(1), unless the authorization is terminated or revoked.  Performed at [facility], [address] [place], [place] [address]   Surgical PCR screen     Status: None   Collection Time: 09/30/20 10:57 PM   Specimen: Nasal Mucosa; Nasal Swab  Result Value Ref Range Status   MRSA, PCR NEGATIVE NEGATIVE Final   Staphylococcus aureus NEGATIVE NEGATIVE Final    Comment: (NOTE) The Xpert SA Assay (FDA approved for NASAL specimens in patients 67 years of age and older), is one component of a comprehensive surveillance program. It is not intended to diagnose infection nor to guide or monitor treatment. Performed at [facility], [address] [place], [place] [address]     Time coordinating discharge: Over 30 minutes  SIGNED:  Lorella Nimrod, MD  [facility] 10/03/2020, 10:40 AM  If 7PM-7AM, please contact night-coverage [email or web address]  This record has been created using Systems analyst. Errors have been sought and corrected,but may not always be located. Such creation  errors do not reflect on the standard of care.

## 2020-10-03 NOTE — Progress Notes (Signed)
This RN delivered discharge instructions and teaching to the patient with the patient's husband at bedside. The patient verbalized and demonstrated understanding of the provided instructions. R arm PIV removed. Cannula intact. Pt tolerated well. All belongings packed and in tow. Transport to discharge patient to private vehicle via wheelchair for discharge.

## 2020-10-03 NOTE — Progress Notes (Signed)
[facility] Day(s): 3.   Post op day(s): 2 Days Post-Op.   Interval History:  Patient seen and examined No acute events or new complaints overnight.  Patient reports she is doing better this morning, still with incisional soreness but improved from yesterday No fever, chills, nausea, emesis LFT continue to gradually make improvements Hyperbilirubinemia improved to 1.7 (from 3.3) and is mixed with direct bilirubin of 0.7 She underwent repeat MRCP yesterday (01/17) which was negative for retained CBD stone Diet advanced to soft diet and she is tolerating this well  Vital signs in last 24 hours: [min-max] current  Temp:  [98 F (36.7 C)-98.5 F (36.9 C)] 98.3 F (36.8 C) (01/18 0455) Pulse Rate:  [69-84] 75 (01/18 0455) Resp:  [16-18] 16 (01/18 0455) BP: (110-123)/(70-89) 116/81 (01/18 0455) SpO2:  [96 %-100 %] 98 % (01/18 0455)     Height: 5\' 2"  (157.5 cm) Weight: 74.8 kg BMI (Calculated): 30.15   Intake/Output last 2 shifts:  01/17 0701 - 01/18 0700 In: 150 [P.O.:150] Out: -    Physical Exam:  Constitutional: alert, cooperative and no distress  HEENT: Improvement in scleral icterus Respiratory: breathing non-labored at rest  Cardiovascular: regular rate and sinus rhythm  Gastrointestinal: Soft, incisional soreness, and non-distended, no rebound/guarding Integumentary: Laparoscopic incisions are CDI with steri-strips, no erythema or drainage   Labs:  CBC Latest Ref Rng & Units 10/02/2020 10/01/2020 09/30/2020  WBC 4.0 - 10.5 K/uL 7.4 5.9 6.6  Hemoglobin 12.0 - 15.0 g/dL 10.2(L) 12.0 11.5(L)  Hematocrit 36.0 - 46.0 % 31.6(L) 35.6(L) 35.1(L)  Platelets 150 - 400 K/uL 151 158 124(L)   CMP Latest Ref Rng & Units 10/03/2020 10/02/2020 10/01/2020  Glucose 70 - 99 mg/dL - 93 95  BUN 6 - 20 mg/dL - 6 7  Creatinine 0.44 - 1.00 mg/dL - 0.37(L) 0.46  Sodium 135 - 145 mmol/L - 138 140  Potassium 3.5 - 5.1 mmol/L - 3.2(L) 4.3  Chloride  98 - 111 mmol/L - 107 107  CO2 22 - 32 mmol/L - 25 24  Calcium 8.9 - 10.3 mg/dL - 7.6(L) 8.8(L)  Total Protein 6.5 - 8.1 g/dL 6.1(L) 6.0(L) 7.0  Total Bilirubin 0.3 - 1.2 mg/dL 1.7(H) 3.3(H) 3.0(H)  Alkaline Phos 38 - 126 U/L 342(H) 407(H) 523(H)  AST 15 - 41 U/L 78(H) 275(H) 270(H)  ALT 0 - 44 U/L 485(H) 655(H) 951(H)    Imaging studies:  MRCP (10/02/2020) personally reviewed without evidence of choledocholithiasis, and radiologist report reviewed:  IMPRESSION: Expected postop changes from recent cholecystectomy. No evidence of postop fluid collections, biliary ductal dilatation, or other complication.   Assessment/Plan:  35 y.o. female with improving hyperbilirubinemia otherwise doing well 2 Days Post-Op s/p robotic assisted laparoscopic cholecystectomy for choledocholithiasis.   - Okay to continue soft diet as tolerated; reviewed dietary recommendations for home   - Monitor abdominal examination             - Pain control prn; antiemetics prn (will need these for home with pain medications)   - Monitor hyperbilirubinemia; improved  - Further management per primary service    - Discharge Planning: Okay for discharge from surgical standpoint. I have reviewed discharge instructions and updated in the chart. She will need analgesic + antiemetics for home. She will follow up in 2 weeks. Reviewed signs/symptoms that should prompt return.   All of the above findings and recommendations were discussed with the patient, and the medical team, and all of  patient's questions were answered to her expressed satisfaction.  -- Edison Simon, PA-C [facility] 10/03/2020, 7:06 AM [phone] M-F: 7am - 4pm

## 2020-10-12 ENCOUNTER — Encounter: Payer: Self-pay | Admitting: Physician Assistant

## 2020-10-12 ENCOUNTER — Telehealth (INDEPENDENT_AMBULATORY_CARE_PROVIDER_SITE_OTHER): Payer: 59 | Admitting: Physician Assistant

## 2020-10-12 ENCOUNTER — Other Ambulatory Visit: Payer: Self-pay

## 2020-10-12 DIAGNOSIS — K8050 Calculus of bile duct without cholangitis or cholecystitis without obstruction: Secondary | ICD-10-CM

## 2020-10-12 DIAGNOSIS — Z09 Encounter for follow-up examination after completed treatment for conditions other than malignant neoplasm: Secondary | ICD-10-CM

## 2020-10-12 NOTE — Progress Notes (Signed)
[facility] SUR[facility]ICAL OFFICE VISIT - TELEPHONE  Referring provider:  Lorrene Reid, PA-C [address] [place],  [place] [address]   Virtual Visit via Telemedicine Note I connected with Rondall Allegra by telephone at her home on 10/12/20 at 10:30 AM EST and verified that I was speaking with the correct person using their name and two idenfiers/date of birth.   I discussed the limitations, risks, security and privacy concerns of performing an evaluation and management service by telephonic/video telemedicine and the availability of in person appointments. I also discussed with the patient that there may be a patient responsible charge related to this service. The patient expressed understanding and agreed to proceed.  History of Present Illness: Mackenzee Becvar is a 35 y.o. 11 days s/p robotic assisted laparoscopic cholecystectomy for choledocholithiasis and cholecystitis.   She is overall doing well She does have cough/congestion and wanted to do a virtual visit to limit any possible exposures. She did test negative for COVID. From a surgical standpoint, she has done well. She is not having any incisional pain. No fever, chills, nausea, emesis. Her icterus and dark urine have resolved. She is tolerating PO and not having any issues with bowel function. No problems with incisions  Physical Examination:  Constitutional: Well sounding female, NAD  Assessment/Plan:  Tenn[place] Hanlon is a 35 y.o. 11 days s/p robotic assisted laparoscopic cholecystectomy for choledocholithiasis and cholecystitis   - Pain control prn  - Reviewed lifting restrictions  - Reviewed surgical pathology  - She can rtc on an as needed basis.    From [facility] outpatient surgery office, I provided 10 minutes of non-face-to-face time during this encounter.  -- Edison Simon, PA-C [facility] 10/12/2020, 10:10 AM [phone] M-F: 7am - 4pm

## 2020-11-08 ENCOUNTER — Other Ambulatory Visit: Payer: Self-pay | Admitting: Physician Assistant

## 2020-11-08 DIAGNOSIS — F419 Anxiety disorder, unspecified: Secondary | ICD-10-CM

## 2020-11-09 ENCOUNTER — Ambulatory Visit (INDEPENDENT_AMBULATORY_CARE_PROVIDER_SITE_OTHER): Payer: 59 | Admitting: Physician Assistant

## 2020-11-09 ENCOUNTER — Encounter: Payer: Self-pay | Admitting: Physician Assistant

## 2020-11-09 ENCOUNTER — Other Ambulatory Visit: Payer: Self-pay

## 2020-11-09 VITALS — BP 113/75 | HR 70 | Temp 97.1°F | Wt 158.1 lb

## 2020-11-09 DIAGNOSIS — N62 Hypertrophy of breast: Secondary | ICD-10-CM | POA: Diagnosis not present

## 2020-11-09 DIAGNOSIS — R748 Abnormal levels of other serum enzymes: Secondary | ICD-10-CM | POA: Diagnosis not present

## 2020-11-09 DIAGNOSIS — F419 Anxiety disorder, unspecified: Secondary | ICD-10-CM | POA: Diagnosis not present

## 2020-11-09 MED ORDER — FLUOXETINE HCL 20 MG PO TABS: 20.0000 mg | ORAL_TABLET | Freq: Every day | ORAL | 1 refills | Status: DC

## 2020-11-09 NOTE — Progress Notes (Addendum)
Established Patient Office Visit  Subjective:  Patient ID: Tina Wilson, female    DOB: 03-09-1986  Age: 35 y.o. MRN: [id number]  CC:  Chief Complaint  Patient presents with  . Depression    HPI Tina Wilson presents for follow up on mood management. Patient was restarted on Prozac. Reports tolerating medication without issues and has noticed an improvement with her mood. Mood is more consistent. Patient recently had cholecystectomy and reports recovering well. Has lost about 10 pounds which has helped with neck/back pain related to her large breasts. Reports history of wearing bra size 36 G.    Past Medical History:  Diagnosis Date  . Allergy   . Anxiety   . Asthma   . Depression   . Herpes genitalis in women 2010  . Migraine   . Thrombocytopenic disorder [facility])     Past Surgical History:  Procedure Laterality Date  . CESAREAN SECTION N/A 12/01/2019   Procedure: CESAREAN SECTION;  Surgeon: Everlene Farrier, MD;  Location: [place] LD ORS;  Service: Obstetrics;  Laterality: N/A;  . ENDOSCOPIC RETROGRADE CHOLANGIOPANCREATOGRAPHY (ERCP) WITH PROPOFOL N/A 09/30/2020   Procedure: ENDOSCOPIC RETROGRADE CHOLANGIOPANCREATOGRAPHY (ERCP) WITH PROPOFOL;  Surgeon: Lucilla Lame, MD;  Location: [facility] ENDOSCOPY;  Service: Endoscopy;  Laterality: N/A;  . NO PAST SURGERIES      Family History  Problem Relation Age of Onset  . Alcohol abuse Maternal Grandmother   . Breast cancer Paternal Grandmother   . Alcohol abuse Paternal Grandfather   . Arthritis Mother   . Depression Mother   . Hyperlipidemia Father   . Hypertension Father   . Alcohol abuse Paternal Aunt   . Heart attack Maternal Grandfather   . Stroke Maternal Grandfather   . Cancer Neg Hx   . COPD Neg Hx   . Diabetes Neg Hx   . Drug abuse Neg Hx   . Early death Neg Hx   . Heart disease Neg Hx   . Kidney disease Neg Hx     Social History   Socioeconomic History  . Marital status: Married    Spouse name:  Christia Reading  . Number of children: 1  . Years of education: Not on file  . Highest education level: Bachelor's degree (e.g., BA, AB, BS)  Occupational History  . Occupation: Pharmacologist  Tobacco Use  . Smoking status: Never Smoker  . Smokeless tobacco: Never Used  Vaping Use  . Vaping Use: Never used  Substance and Sexual Activity  . Alcohol use: Yes    Alcohol/week: 14.0 standard drinks    Types: 10 Glasses of wine, 4 Standard drinks or equivalent per week  . Drug use: No    Comment: valtrex  . Sexual activity: Yes    Partners: Male    Birth control/protection: None    Comment: married.  Other Topics Concern  . Not on file  Social History Narrative   ** Merged History Encounter **       Marital status: married      Children: none      Living with: husband      Employment: works from [employer].      Tobacco: none       Alcohol: 2 glasses wine nightly      Drugs: none      Exercise: sporadically      Patient is right-handed. She lives with her husband and daughter in a 2 story home. She drinks 1-2 cups of coffee a day  and rare tea or soda. She does not exercise.   Social Determinants of Health   Financial Resource Strain: Not on file  Food Insecurity: Not on file  Transportation Needs: Not on file  Physical Activity: Not on file  Stress: Not on file  Social Connections: Not on file  Intimate Partner Violence: Not on file    Outpatient Medications Prior to Visit  Medication Sig Dispense Refill  . FLUoxetine (PROZAC) 20 MG tablet Take 0.5 (10mg ) tablet once daily for 7 days. Then take 1 tablet once daily. 30 tablet 1  . naproxen (NAPROSYN) 500 MG tablet Take 1 tablet (500 mg total) by mouth 2 (two) times daily. 30 tablet 0  . ondansetron (ZOFRAN ODT) 4 MG disintegrating tablet Take 1 tablet (4 mg total) by mouth every 8 (eight) hours as needed for nausea or vomiting. 20 tablet 0  . tiZANidine (ZANAFLEX) 4 MG tablet Take 1 tablet (4 mg total) by mouth every 6  (six) hours as needed for muscle spasms. 30 tablet 0  . traMADol (ULTRAM) 50 MG tablet Take 1 tablet (50 mg total) by mouth every 6 (six) hours as needed for severe pain. 8 tablet 0   No facility-administered medications prior to visit.    No Known Allergies  ROS Review of Systems A fourteen system review of systems was performed and found to be positive as per HPI.   Objective:    Physical Exam General:  Well Developed, well nourished, appropriate for stated age.  Neuro:  Alert and oriented,  extra-ocular muscles intact  HEENT:  Normocephalic, atraumatic, neck supple Skin:  no gross rash, warm, pink. Cardiac:  RRR, S1 S2 w/o murmur  Respiratory:  ECTA B/L w/o wheezing, Not using accessory muscles, speaking in full sentences- unlabored. Vascular:  Ext warm, no cyanosis apprec.; cap RF less 2 sec. Psych:  No HI/SI, judgement and insight good, Euthymic mood. Full Affect.  BP 113/75   Pulse 70   Temp (!) 97.1 F (36.2 C)   Wt 158 lb 1.6 oz (71.7 kg)   SpO2 100%   BMI 28.92 kg/m  Wt Readings from Last 3 Encounters:  11/09/20 158 lb 1.6 oz (71.7 kg)  09/30/20 164 lb 14.5 oz (74.8 kg)  09/28/20 165 lb (74.8 kg)     Health Maintenance Due  Topic Date Due  . PAP SMEAR-Modifier  12/24/2015  . INFLUENZA VACCINE  04/16/2020  . COVID-19 Vaccine (3 - Booster for Pfizer series) 08/20/2020    There are no preventive care reminders to display for this patient.  Lab Results  Component Value Date   TSH 1.450 07/16/2018   Lab Results  Component Value Date   WBC 7.4 10/02/2020   HGB 10.2 (L) 10/02/2020   HCT 31.6 (L) 10/02/2020   MCV 94.0 10/02/2020   PLT 151 10/02/2020   Lab Results  Component Value Date   NA 138 10/02/2020   K 3.2 (L) 10/02/2020   CO2 25 10/02/2020   GLUCOSE 93 10/02/2020   BUN 6 10/02/2020   CREATININE 0.37 (L) 10/02/2020   BILITOT 1.7 (H) 10/03/2020   ALKPHOS 342 (H) 10/03/2020   AST 78 (H) 10/03/2020   ALT 485 (H) 10/03/2020   PROT 6.1 (L)  10/03/2020   ALBUMIN 3.3 (L) 10/03/2020   CALCIUM 7.6 (L) 10/02/2020   ANIONGAP 6 10/02/2020   GFR 128.70 01/11/2013   Lab Results  Component Value Date   CHOL 246 (H) 07/16/2018   Lab Results  Component Value Date  HDL 55 07/16/2018   Lab Results  Component Value Date   LDLCALC 165 (H) 07/16/2018   Lab Results  Component Value Date   TRIG 129 07/16/2018   Lab Results  Component Value Date   CHOLHDL 4.5 (H) 07/16/2018   Lab Results  Component Value Date   HGBA1C 5.2 07/16/2018      Assessment & Plan:   Problem List Items Addressed This Visit      Other   Increased liver enzymes    Other Visit Diagnoses    Anxiety    -  Primary   Relevant Medications   FLUoxetine (PROZAC) 20 MG tablet   Large breasts         Anxiety: -Improved. GAD 7 and PHQ-9 are stable. Denies SI/HI. -Will continue current medication regimen. Provided refill. -Will continue to monitor. GAD 7 : Generalized Anxiety Score 11/09/2020 11/09/2020  Nervous, Anxious, on Edge 1 1  Control/stop worrying 1 1  Worry too much - different things 0 0  Trouble relaxing 1 1  Restless 0 0  Easily annoyed or irritable 0 0  Afraid - awful might happen 0 0  Total GAD 7 Score 3 3  Anxiety Difficulty - Not difficult at all    Depression screen [facility] 2/9 11/09/2020 09/28/2020 06/01/2019 08/04/2018 07/20/2018  Decreased Interest 0 0 0 0 0  Down, Depressed, Hopeless 0 0 0 0 0  PHQ - 2 Score 0 0 0 0 0  Altered sleeping - 0 1 1 1   Tired, decreased energy 1 0 3 0 0  Change in appetite 0 0 0 0 1  Feeling bad or failure about yourself  0 0 0 2 1  Trouble concentrating 1 0 0 0 0  Moving slowly or fidgety/restless 0 0 0 0 0  Suicidal thoughts 0 0 0 0 0  PHQ-9 Score - 0 4 3 3   Difficult doing work/chores - - Not difficult at all Somewhat difficult -    Large breasts: -Patient will continue with weight loss efforts before considering breast reduction surgery.  Increased liver enzymes: -Reviewed hepatic  function 10/03/20: AST 78, ALT 485, Alkaline phosphatase 342 -S/p cholecystectomy for cholecystitis and choledocholithiasis. Advised patient to schedule lab to repeat hepatic function.   Meds ordered this encounter  Medications  . FLUoxetine (PROZAC) 20 MG tablet    Sig: Take 1 tablet (20 mg total) by mouth daily.    Dispense:  90 tablet    Refill:  1    Order Specific Question:   Supervising Provider    Answer:   Beatrice Lecher D [[id number]]    Follow-up: Return in about 4 months (around 03/09/2021) for Mood; lab visit in 1-2 weeks for CMP.    Lorrene Reid, PA-C

## 2020-11-09 NOTE — Patient Instructions (Signed)

## 2020-11-17 ENCOUNTER — Other Ambulatory Visit: Payer: Self-pay | Admitting: Physician Assistant

## 2020-11-17 DIAGNOSIS — Z0000 Encounter for general adult medical examination without abnormal findings: Secondary | ICD-10-CM

## 2020-11-17 DIAGNOSIS — R748 Abnormal levels of other serum enzymes: Secondary | ICD-10-CM

## 2020-11-23 ENCOUNTER — Other Ambulatory Visit: Payer: 59

## 2020-11-23 ENCOUNTER — Other Ambulatory Visit: Payer: Self-pay

## 2020-11-23 DIAGNOSIS — Z0000 Encounter for general adult medical examination without abnormal findings: Secondary | ICD-10-CM

## 2020-11-23 DIAGNOSIS — R748 Abnormal levels of other serum enzymes: Secondary | ICD-10-CM

## 2020-11-24 LAB — COMPREHENSIVE METABOLIC PANEL
ALT: 17 IU/L (ref 0–32)
AST: 16 IU/L (ref 0–40)
Albumin/Globulin Ratio: 2 (ref 1.2–2.2)
Albumin: 5 g/dL — ABNORMAL HIGH (ref 3.8–4.8)
Alkaline Phosphatase: 65 IU/L (ref 44–121)
BUN/Creatinine Ratio: 14 (ref 9–23)
BUN: 10 mg/dL (ref 6–20)
Bilirubin Total: 0.5 mg/dL (ref 0.0–1.2)
CO2: 22 mmol/L (ref 20–29)
Calcium: 9.2 mg/dL (ref 8.7–10.2)
Chloride: 100 mmol/L (ref 96–106)
Creatinine, Ser: 0.74 mg/dL (ref 0.57–1.00)
Globulin, Total: 2.5 g/dL (ref 1.5–4.5)
Glucose: 87 mg/dL (ref 65–99)
Potassium: 4.4 mmol/L (ref 3.5–5.2)
Sodium: 138 mmol/L (ref 134–144)
Total Protein: 7.5 g/dL (ref 6.0–8.5)
eGFR: 109 mL/min/{1.73_m2} (ref >59)

## 2021-03-04 ENCOUNTER — Other Ambulatory Visit: Payer: Self-pay | Admitting: Physician Assistant

## 2021-03-04 DIAGNOSIS — F419 Anxiety disorder, unspecified: Secondary | ICD-10-CM

## 2021-03-05 ENCOUNTER — Other Ambulatory Visit: Payer: Self-pay | Admitting: Nurse Practitioner

## 2021-03-05 DIAGNOSIS — Z8659 Personal history of other mental and behavioral disorders: Secondary | ICD-10-CM

## 2021-03-05 MED ORDER — FLUOXETINE HCL 20 MG PO CAPS: 20.0000 mg | ORAL_CAPSULE | Freq: Every day | ORAL | 1 refills | Status: DC

## 2021-03-05 NOTE — Progress Notes (Signed)
Changed fluoxetine tablets to fluoxetine 20mg  capsules and sent new 90 day prescription to [facility] [address].

## 2021-03-05 NOTE — Telephone Encounter (Signed)
Changed fluoxetine tablets to fluoxetine 20mg  capsules and sent new 90 day prescription to [facility] [address].

## 2021-03-15 ENCOUNTER — Ambulatory Visit (INDEPENDENT_AMBULATORY_CARE_PROVIDER_SITE_OTHER): Payer: BC Managed Care – PPO | Admitting: Physician Assistant

## 2021-03-15 ENCOUNTER — Other Ambulatory Visit: Payer: Self-pay

## 2021-03-15 ENCOUNTER — Encounter: Payer: Self-pay | Admitting: Physician Assistant

## 2021-03-15 VITALS — BP 106/68 | HR 68 | Temp 98.4°F | Ht 62.0 in | Wt 160.1 lb

## 2021-03-15 DIAGNOSIS — F419 Anxiety disorder, unspecified: Secondary | ICD-10-CM | POA: Diagnosis not present

## 2021-03-15 NOTE — Progress Notes (Signed)
Established Patient Office Visit  Subjective:  Patient ID: Tina Wilson, female    DOB: 1986-05-19  Age: 35 y.o. MRN: [id number]  CC:  Chief Complaint  Patient presents with   Follow-up    Mood    HPI Tina Wilson presents for follow up on mood management. Has no acute concerns. Patient reports medication compliance. Anxiety has remained stable. States some days can be more stressful due to lack of sleep. Has two children and one is a one year old who is not a good sleeper. Denies SI/HI or significant mood changes. Continues to work on weight loss.   Past Medical History:  Diagnosis Date   Allergy    Anxiety    Asthma    Depression    Herpes genitalis in women 2010   Migraine    Thrombocytopenic disorder [facility])     Past Surgical History:  Procedure Laterality Date   CESAREAN SECTION N/A 12/01/2019   Procedure: CESAREAN SECTION;  Surgeon: Everlene Farrier, MD;  Location: [facility];  Service: Obstetrics;  Laterality: N/A;   ENDOSCOPIC RETROGRADE CHOLANGIOPANCREATOGRAPHY (ERCP) WITH PROPOFOL N/A 09/30/2020   Procedure: ENDOSCOPIC RETROGRADE CHOLANGIOPANCREATOGRAPHY (ERCP) WITH PROPOFOL;  Surgeon: Lucilla Lame, MD;  Location: [facility] ENDOSCOPY;  Service: Endoscopy;  Laterality: N/A;   NO PAST SURGERIES      Family History  Problem Relation Age of Onset   Alcohol abuse Maternal Grandmother    Breast cancer Paternal Grandmother    Alcohol abuse Paternal Grandfather    Arthritis Mother    Depression Mother    Hyperlipidemia Father    Hypertension Father    Alcohol abuse Paternal Aunt    Heart attack Maternal Grandfather    Stroke Maternal Grandfather    Cancer Neg Hx    COPD Neg Hx    Diabetes Neg Hx    Drug abuse Neg Hx    Early death Neg Hx    Heart disease Neg Hx    Kidney disease Neg Hx     Social History   Socioeconomic History   Marital status: Married    Spouse name: Christia Reading   Number of children: 1   Years of education: Not on file    Highest education level: Bachelor's degree (e.g., BA, AB, BS)  Occupational History   Occupation: marketing  Tobacco Use   Smoking status: Never   Smokeless tobacco: Never  Vaping Use   Vaping Use: Never used  Substance and Sexual Activity   Alcohol use: Yes    Alcohol/week: 14.0 standard drinks    Types: 10 Glasses of wine, 4 Standard drinks or equivalent per week   Drug use: No    Comment: valtrex   Sexual activity: Yes    Partners: Male    Birth control/protection: None    Comment: married.  Other Topics Concern   Not on file  Social History Narrative   ** Merged History Encounter **       Marital status: married      Children: none      Living with: husband      Employment: works from [employer].      Tobacco: none       Alcohol: 2 glasses wine nightly      Drugs: none      Exercise: sporadically      Patient is right-handed. She lives with her husband and daughter in a 2 story home. She drinks 1-2 cups of coffee a day and  rare tea or soda. She does not exercise.   Social Determinants of Health   Financial Resource Strain: Not on file  Food Insecurity: Not on file  Transportation Needs: Not on file  Physical Activity: Not on file  Stress: Not on file  Social Connections: Not on file  Intimate Partner Violence: Not on file    Outpatient Medications Prior to Visit  Medication Sig Dispense Refill   FLUoxetine (PROZAC) 20 MG capsule Take 1 capsule (20 mg total) by mouth daily. 90 capsule 1   No facility-administered medications prior to visit.    No Known Allergies  ROS Review of Systems A fourteen system review of systems was performed and found to be positive as per HPI.  Objective:    Physical Exam General:  Well Developed, well nourished, appropriate for stated age.  Neuro:  Alert and oriented,  extra-ocular muscles intact  HEENT:  Normocephalic, atraumatic, neck supple, no carotid bruits appreciated  Skin:  no gross rash, warm,  pink. Cardiac:  RRR, S1 S2 Respiratory:  ECTA B/L, Not using accessory muscles, speaking in full sentences- unlabored. Vascular:  Ext warm, no cyanosis apprec.; cap RF less 2 sec. Psych:  No HI/SI, judgement and insight good, Euthymic mood. Full Affect.  BP 106/68   Pulse 68   Temp 98.4 F (36.9 C)   Ht 5' 2"  (1.575 m)   Wt 160 lb 1.6 oz (72.6 kg)   LMP 02/22/2021 (Approximate)   SpO2 100%   BMI 29.28 kg/m  Wt Readings from Last 3 Encounters:  03/15/21 160 lb 1.6 oz (72.6 kg)  11/09/20 158 lb 1.6 oz (71.7 kg)  09/30/20 164 lb 14.5 oz (74.8 kg)     Health Maintenance Due  Topic Date Due   PAP SMEAR-Modifier  12/24/2015   COVID-19 Vaccine (3 - Booster for Pfizer series) 07/21/2020    There are no preventive care reminders to display for this patient.  Lab Results  Component Value Date   TSH 1.450 07/16/2018   Lab Results  Component Value Date   WBC 7.4 10/02/2020   HGB 10.2 (L) 10/02/2020   HCT 31.6 (L) 10/02/2020   MCV 94.0 10/02/2020   PLT 151 10/02/2020   Lab Results  Component Value Date   NA 138 11/23/2020   K 4.4 11/23/2020   CO2 22 11/23/2020   GLUCOSE 87 11/23/2020   BUN 10 11/23/2020   CREATININE 0.74 11/23/2020   BILITOT 0.5 11/23/2020   ALKPHOS 65 11/23/2020   AST 16 11/23/2020   ALT 17 11/23/2020   PROT 7.5 11/23/2020   ALBUMIN 5.0 (H) 11/23/2020   CALCIUM 9.2 11/23/2020   ANIONGAP 6 10/02/2020   EGFR 109 11/23/2020   GFR 128.70 01/11/2013   Lab Results  Component Value Date   CHOL 246 (H) 07/16/2018   Lab Results  Component Value Date   HDL 55 07/16/2018   Lab Results  Component Value Date   LDLCALC 165 (H) 07/16/2018   Lab Results  Component Value Date   TRIG 129 07/16/2018   Lab Results  Component Value Date   CHOLHDL 4.5 (H) 07/16/2018   Lab Results  Component Value Date   HGBA1C 5.2 07/16/2018      Assessment & Plan:   Problem List Items Addressed This Visit   None Visit Diagnoses     Anxiety    -  Primary       Anxiety: -PHQ-9 score of 2, GAD-7 score of 4. Stable. -Continue current medication regimen.  Advised patient to let me know if mood changes or becomes unstable and recommend adjusting medication therapy by increasing fluoxetine to 30 mg. Patient verbalized understanding. -Recommend to incorporate mindfulness therapy. -Will continue to monitor.   No orders of the defined types were placed in this encounter.   Follow-up: Return for CPE and FBW few days prior in 3-4 months .   Note:  This note was prepared with assistance of Dragon voice recognition software. Occasional wrong-word or sound-a-like substitutions may have occurred due to the inherent limitations of voice recognition software.   Lorrene Reid, PA-C

## 2021-03-15 NOTE — Patient Instructions (Signed)

## 2021-05-11 ENCOUNTER — Telehealth: Payer: Self-pay | Admitting: Physician Assistant

## 2021-05-11 ENCOUNTER — Other Ambulatory Visit: Payer: Self-pay

## 2021-05-11 ENCOUNTER — Encounter: Payer: Self-pay | Admitting: Emergency Medicine

## 2021-05-11 ENCOUNTER — Ambulatory Visit
Admission: EM | Admit: 2021-05-11 | Discharge: 2021-05-11 | Disposition: A | Discharge status: Home / Self Care | Payer: BC Managed Care – PPO | Attending: Internal Medicine | Admitting: Internal Medicine

## 2021-05-11 DIAGNOSIS — R509 Fever, unspecified: Secondary | ICD-10-CM | POA: Diagnosis not present

## 2021-05-11 DIAGNOSIS — J069 Acute upper respiratory infection, unspecified: Secondary | ICD-10-CM | POA: Insufficient documentation

## 2021-05-11 DIAGNOSIS — J029 Acute pharyngitis, unspecified: Secondary | ICD-10-CM | POA: Diagnosis not present

## 2021-05-11 DIAGNOSIS — R059 Cough, unspecified: Secondary | ICD-10-CM | POA: Diagnosis not present

## 2021-05-11 LAB — POCT RAPID STREP A (OFFICE): Rapid Strep A Screen: NEGATIVE

## 2021-05-11 MED ORDER — BENZONATATE 100 MG PO CAPS: 100.0000 mg | ORAL_CAPSULE | Freq: Three times a day (TID) | ORAL | 0 refills | Status: DC | PRN

## 2021-05-11 NOTE — Telephone Encounter (Signed)
Patient is in [place] now being seen. AS, CMA

## 2021-05-11 NOTE — Discharge Instructions (Addendum)
You likely having a viral upper respiratory infection. We recommended symptom control. I expect your symptoms to start improving in the next 1-2 weeks.   1. Take a daily allergy pill/anti-histamine like Zyrtec, Claritin, or Store brand consistently for 2 weeks  2. For congestion you may try an oral decongestant like Mucinex or sudafed. You may also try intranasal flonase nasal spray or saline irrigations (neti pot, sinus cleanse)  3. For your sore throat you may try cepacol lozenges, salt water gargles, throat spray. Treatment of congestion may also help your sore throat.  4. For cough you may try Robitussen, Mucinex DM  5. Take Tylenol or Ibuprofen to help with pain/inflammation  6. Stay hydrated, drink plenty of fluids to keep throat coated and less irritated  Honey Tea For cough/sore throat try using a honey-based tea. Use 3 teaspoons of honey with juice squeezed from half lemon. Place shaved pieces of ginger into 1/2-1 cup of water and warm over stove top. Then mix the ingredients and repeat every 4 hours as needed.   Your rapid strep test was negative. Throat culture and covid 19 viral swab are pending. We will call if these are positive.

## 2021-05-11 NOTE — ED Provider Notes (Signed)
[name]    CSN: [id number] Arrival date & time: 05/11/21  0831      History   Chief Complaint Chief Complaint  Patient presents with   Sore Throat    HPI Tina Wilson is a 35 y.o. female.   Patient presents with 3-day history of sore throat, cough, runny nose, and congestion.  Denies any known fevers or known sick contacts.  Has been taking NyQuil over-the-counter for symptoms with minimal relief.  Patient has taken at home COVID-19 test 2 days ago that was negative.  Denies any chest pain or shortness of breath.    Sore Throat   Past Medical History:  Diagnosis Date   Allergy    Anxiety    Asthma    Depression    Herpes genitalis in women 2010   Migraine    Thrombocytopenic disorder [facility])     Patient Active Problem List   Diagnosis Date Noted   Right upper quadrant abdominal pain    Choledocholithiasis 09/30/2020   Increased liver enzymes    Term pregnancy 12/01/2019   Rhinosinusitis 06/01/2019   Thrombocytopenia ([place]) 08/04/2018   Elevated LDL cholesterol level 08/04/2018   Sore throat 07/20/2018   Cough 07/20/2018   Migraine without aura and without status migrainosus, not intractable 06/23/2018   Post-dates pregnancy 10/30/2016   Healthcare maintenance 01/11/2013    Past Surgical History:  Procedure Laterality Date   CESAREAN SECTION N/A 12/01/2019   Procedure: CESAREAN SECTION;  Surgeon: Everlene Farrier, MD;  Location: [place] [facility];  Service: Obstetrics;  Laterality: N/A;   ENDOSCOPIC RETROGRADE CHOLANGIOPANCREATOGRAPHY (ERCP) WITH PROPOFOL N/A 09/30/2020   Procedure: ENDOSCOPIC RETROGRADE CHOLANGIOPANCREATOGRAPHY (ERCP) WITH PROPOFOL;  Surgeon: Lucilla Lame, MD;  Location: [facility] ENDOSCOPY;  Service: Endoscopy;  Laterality: N/A;   NO PAST SURGERIES      OB History     Gravida  2   Para  2   Term  2   Preterm      AB      Living  2      SAB      IAB      Ectopic      Multiple  0   Live Births  2             Home Medications    Prior to Admission medications   Medication Sig Start Date End Date Taking? Authorizing Provider  benzonatate (TESSALON) 100 MG capsule Take 1 capsule (100 mg total) by mouth every 8 (eight) hours as needed for cough. 05/11/21  Yes Odis Luster, FNP  FLUoxetine (PROZAC) 20 MG capsule Take 1 capsule (20 mg total) by mouth daily. 03/05/21  Yes Ronnell Freshwater, NP    Family History Family History  Problem Relation Age of Onset   Alcohol abuse Maternal Grandmother    Breast cancer Paternal Grandmother    Alcohol abuse Paternal Grandfather    Arthritis Mother    Depression Mother    Hyperlipidemia Father    Hypertension Father    Alcohol abuse Paternal Aunt    Heart attack Maternal Grandfather    Stroke Maternal Grandfather    Cancer Neg Hx    COPD Neg Hx    Diabetes Neg Hx    Drug abuse Neg Hx    Early death Neg Hx    Heart disease Neg Hx    Kidney disease Neg Hx     Social History Social History   Tobacco Use   Smoking  status: Never   Smokeless tobacco: Never  Vaping Use   Vaping Use: Never used  Substance Use Topics   Alcohol use: Yes    Alcohol/week: 14.0 standard drinks    Types: 10 Glasses of wine, 4 Standard drinks or equivalent per week   Drug use: No    Comment: valtrex     Allergies   Patient has no known allergies.   Review of Systems Review of Systems Per HPI  Physical Exam Triage Vital Signs ED Triage Vitals  Enc Vitals Group     BP 05/11/21 0859 126/87     Pulse Rate 05/11/21 0859 76     Resp 05/11/21 0859 18     Temp 05/11/21 0859 98.2 F (36.8 C)     Temp Source 05/11/21 0859 Oral     SpO2 05/11/21 0859 98 %     Weight 05/11/21 0901 165 lb (74.8 kg)     Height 05/11/21 0901 '5\' 2"'$  (1.575 m)     Head Circumference --      Peak Flow --      Pain Score 05/11/21 0901 8     Pain Loc --      Pain Edu? --      Excl. in [place]? --    No data found.  Updated Vital Signs BP 126/87 (BP Location: Left Arm)   Pulse  76   Temp 98.2 F (36.8 C) (Oral)   Resp 18   Ht '5\' 2"'$  (1.575 m)   Wt 165 lb (74.8 kg)   LMP 04/20/2021   SpO2 98%   Breastfeeding No   BMI 30.18 kg/m   Visual Acuity Right Eye Distance:   Left Eye Distance:   Bilateral Distance:    Right Eye Near:   Left Eye Near:    Bilateral Near:     Physical Exam Constitutional:      General: She is not in acute distress.    Appearance: Normal appearance.  HENT:     Head: Normocephalic and atraumatic.     Right Ear: Tympanic membrane and ear canal normal.     Left Ear: Tympanic membrane and ear canal normal.     Nose: Congestion present.     Mouth/Throat:     Mouth: Mucous membranes are moist.     Pharynx: Posterior oropharyngeal erythema present.  Eyes:     Extraocular Movements: Extraocular movements intact.     Conjunctiva/sclera: Conjunctivae normal.     Pupils: Pupils are equal, round, and reactive to light.  Cardiovascular:     Rate and Rhythm: Normal rate and regular rhythm.     Pulses: Normal pulses.     Heart sounds: Normal heart sounds.  Pulmonary:     Effort: Pulmonary effort is normal. No respiratory distress.     Breath sounds: Normal breath sounds. No wheezing.  Abdominal:     General: Abdomen is flat. Bowel sounds are normal.     Palpations: Abdomen is soft.  Musculoskeletal:        General: Normal range of motion.     Cervical back: Normal range of motion.  Skin:    General: Skin is warm and dry.  Neurological:     General: No focal deficit present.     Mental Status: She is alert and oriented to person, place, and time. Mental status is at baseline.  Psychiatric:        Mood and Affect: Mood normal.        Behavior: Behavior  normal.     UC Treatments / Results  Labs (all labs ordered are listed, but only abnormal results are displayed) Labs Reviewed  CULTURE, GROUP A STREP ([place])  NOVEL CORONAVIRUS, NAA  POCT RAPID STREP A (OFFICE)    EKG   Radiology No results  found.  Procedures Procedures (including critical care time)  Medications Ordered in UC Medications - No data to display  Initial Impression / Assessment and Plan / UC Course  I have reviewed the triage vital signs and the nursing notes.  Pertinent labs & imaging results that were available during my care of the patient were reviewed by me and considered in my medical decision making (see chart for details).     Patient presents with symptoms likely from a viral upper respiratory infection. Differential includes bacterial pneumonia, sinusitis, allergic rhinitis, Covid 19. Do not suspect underlying cardiopulmonary process. Symptoms seem unlikely related to ACS, CHF or COPD exacerbations, pneumonia, pneumothorax. Patient is nontoxic appearing and not in need of emergent medical intervention.  Recommended symptom control with over the counter medications: Daily oral anti-histamine, Oral decongestant or IN corticosteroid, saline irrigations, cepacol lozenges, Robitussin, Delsym, honey tea.  Benzonatate prescribed take as needed for cough.   Strep test was negative today.  Throat culture and COVID-19 viral swab are pending.  Return if symptoms fail to improve in 1-2 weeks or you develop shortness of breath, chest pain, severe headache. Patient states understanding and is agreeable.  Discharged with PCP or urgent care followup.  Final Clinical Impressions(s) / UC Diagnoses   Final diagnoses:  Sore throat  Viral upper respiratory infection  Cough  Fever, unspecified     Discharge Instructions      You likely having a viral upper respiratory infection. We recommended symptom control. I expect your symptoms to start improving in the next 1-2 weeks.   1. Take a daily allergy pill/anti-histamine like Zyrtec, Claritin, or Store brand consistently for 2 weeks  2. For congestion you may try an oral decongestant like Mucinex or sudafed. You may also try intranasal flonase nasal spray or  saline irrigations (neti pot, sinus cleanse)  3. For your sore throat you may try cepacol lozenges, salt water gargles, throat spray. Treatment of congestion may also help your sore throat.  4. For cough you may try Robitussen, Mucinex DM  5. Take Tylenol or Ibuprofen to help with pain/inflammation  6. Stay hydrated, drink plenty of fluids to keep throat coated and less irritated  Honey Tea For cough/sore throat try using a honey-based tea. Use 3 teaspoons of honey with juice squeezed from half lemon. Place shaved pieces of ginger into 1/2-1 cup of water and warm over stove top. Then mix the ingredients and repeat every 4 hours as needed.   Your rapid strep test was negative. Throat culture and covid 19 viral swab are pending. We will call if these are positive.      ED Prescriptions     Medication Sig Dispense Auth. Provider   benzonatate (TESSALON) 100 MG capsule Take 1 capsule (100 mg total) by mouth every 8 (eight) hours as needed for cough. 21 capsule Odis Luster, FNP      PDMP not reviewed this encounter.   Odis Luster, FNP 05/11/21 1001

## 2021-05-11 NOTE — Telephone Encounter (Signed)
Patient left a voicemail stating she believes she has strep or bronchitis and asked for a sick visit, as we are booked today. Please advise, thanks.

## 2021-05-11 NOTE — ED Triage Notes (Signed)
Patient c/o sore throat, productive cough x 3 days.  Patient taken Nyquil for sx's.  Patient did home COVID test negative.  Patient is vaccinated for COVID.

## 2021-05-12 LAB — SARS-COV-2, NAA 2 DAY TAT

## 2021-05-12 LAB — NOVEL CORONAVIRUS, NAA: SARS-CoV-2, NAA: NOT DETECTED

## 2021-05-15 LAB — CULTURE, GROUP A STREP (THRC)

## 2021-06-01 LAB — HM PAP SMEAR: HM Pap smear: NORMAL

## 2021-06-19 ENCOUNTER — Other Ambulatory Visit: Payer: Self-pay

## 2021-06-19 ENCOUNTER — Other Ambulatory Visit: Payer: BC Managed Care – PPO

## 2021-06-19 DIAGNOSIS — R748 Abnormal levels of other serum enzymes: Secondary | ICD-10-CM

## 2021-06-19 DIAGNOSIS — Z0000 Encounter for general adult medical examination without abnormal findings: Secondary | ICD-10-CM | POA: Diagnosis not present

## 2021-06-19 NOTE — Addendum Note (Signed)
Addended by: Vivia Birmingham on: 06/19/2021 09:02 AM   Modules accepted: Orders

## 2021-06-21 LAB — COMPREHENSIVE METABOLIC PANEL
ALT: 16 IU/L (ref 0–32)
AST: 21 IU/L (ref 0–40)
Albumin/Globulin Ratio: 1.8 (ref 1.2–2.2)
Albumin: 4.8 g/dL (ref 3.8–4.8)
Alkaline Phosphatase: 55 IU/L (ref 44–121)
BUN/Creatinine Ratio: 20 (ref 9–23)
BUN: 12 mg/dL (ref 6–20)
Bilirubin Total: 0.3 mg/dL (ref 0.0–1.2)
CO2: 21 mmol/L (ref 20–29)
Calcium: 9.4 mg/dL (ref 8.7–10.2)
Chloride: 101 mmol/L (ref 96–106)
Creatinine, Ser: 0.59 mg/dL (ref 0.57–1.00)
Globulin, Total: 2.7 g/dL (ref 1.5–4.5)
Glucose: 91 mg/dL (ref 70–99)
Potassium: 4.7 mmol/L (ref 3.5–5.2)
Sodium: 137 mmol/L (ref 134–144)
Total Protein: 7.5 g/dL (ref 6.0–8.5)
eGFR: 121 mL/min/{1.73_m2} (ref >59)

## 2021-06-21 LAB — CBC WITH DIFFERENTIAL/PLATELET
Basophils Absolute: 0 10*3/uL (ref 0.0–0.2)
Basos: 1 %
EOS (ABSOLUTE): 0.1 10*3/uL (ref 0.0–0.4)
Eos: 2 %
Hematocrit: 39.4 % (ref 34.0–46.6)
Hemoglobin: 13 g/dL (ref 11.1–15.9)
Immature Grans (Abs): 0 10*3/uL (ref 0.0–0.1)
Immature Granulocytes: 0 %
Lymphocytes Absolute: 1.6 10*3/uL (ref 0.7–3.1)
Lymphs: 33 %
MCH: 30.2 pg (ref 26.6–33.0)
MCHC: 33 g/dL (ref 31.5–35.7)
MCV: 91 fL (ref 79–97)
Monocytes Absolute: 0.4 10*3/uL (ref 0.1–0.9)
Monocytes: 7 %
Neutrophils Absolute: 2.9 10*3/uL (ref 1.4–7.0)
Neutrophils: 57 %
Platelets: 100 10*3/uL — CL (ref 150–450)
RBC: 4.31 x10E6/uL (ref 3.77–5.28)
RDW: 12.4 % (ref 11.7–15.4)
WBC: 5 10*3/uL (ref 3.4–10.8)

## 2021-06-21 LAB — TSH: TSH: 1.8 u[IU]/mL (ref 0.450–4.500)

## 2021-06-21 LAB — LIPID PANEL
Chol/HDL Ratio: 4.5 ratio — ABNORMAL HIGH (ref 0.0–4.4)
Cholesterol, Total: 268 mg/dL — ABNORMAL HIGH (ref 100–199)
HDL: 59 mg/dL (ref >39)
LDL Chol Calc (NIH): 177 mg/dL — ABNORMAL HIGH (ref 0–99)
Triglycerides: 173 mg/dL — ABNORMAL HIGH (ref 0–149)
VLDL Cholesterol Cal: 32 mg/dL (ref 5–40)

## 2021-06-21 LAB — HEMOGLOBIN A1C
Est. average glucose Bld gHb Est-mCnc: 105 mg/dL
Hgb A1c MFr Bld: 5.3 % (ref 4.8–5.6)

## 2021-06-27 ENCOUNTER — Other Ambulatory Visit: Payer: Self-pay

## 2021-06-27 ENCOUNTER — Ambulatory Visit (INDEPENDENT_AMBULATORY_CARE_PROVIDER_SITE_OTHER): Payer: BC Managed Care – PPO | Admitting: Physician Assistant

## 2021-06-27 ENCOUNTER — Encounter: Payer: Self-pay | Admitting: Physician Assistant

## 2021-06-27 VITALS — BP 105/70 | HR 82 | Temp 97.8°F | Ht 62.0 in | Wt 166.2 lb

## 2021-06-27 DIAGNOSIS — Z23 Encounter for immunization: Secondary | ICD-10-CM

## 2021-06-27 DIAGNOSIS — Z832 Family history of diseases of the blood and blood-forming organs and certain disorders involving the immune mechanism: Secondary | ICD-10-CM

## 2021-06-27 DIAGNOSIS — D696 Thrombocytopenia, unspecified: Secondary | ICD-10-CM

## 2021-06-27 DIAGNOSIS — Z0000 Encounter for general adult medical examination without abnormal findings: Secondary | ICD-10-CM | POA: Diagnosis not present

## 2021-06-27 DIAGNOSIS — D229 Melanocytic nevi, unspecified: Secondary | ICD-10-CM

## 2021-06-27 DIAGNOSIS — E7800 Pure hypercholesterolemia, unspecified: Secondary | ICD-10-CM

## 2021-06-27 NOTE — Progress Notes (Signed)
Subjective:     Tina Wilson is a 35 y.o. female and is here for a comprehensive physical exam. The patient reports no problems.  Social History   Socioeconomic History   Marital status: Married    Spouse name: Christia Reading   Number of children: 1   Years of education: Not on file   Highest education level: Bachelor's degree (e.g., BA, AB, BS)  Occupational History   Occupation: marketing  Tobacco Use   Smoking status: Never   Smokeless tobacco: Never  Vaping Use   Vaping Use: Never used  Substance and Sexual Activity   Alcohol use: Yes    Alcohol/week: 14.0 standard drinks    Types: 10 Glasses of wine, 4 Standard drinks or equivalent per week   Drug use: No    Comment: valtrex   Sexual activity: Yes    Partners: Male    Birth control/protection: None    Comment: married.  Other Topics Concern   Not on file  Social History Narrative   ** Merged History Encounter **       Marital status: married      Children: none      Living with: husband      Employment: works from [employer].      Tobacco: none       Alcohol: 2 glasses wine nightly      Drugs: none      Exercise: sporadically      Patient is right-handed. She lives with her husband and daughter in a 2 story home. She drinks 1-2 cups of coffee a day and rare tea or soda. She does not exercise.   Social Determinants of Health   Financial Resource Strain: Not on file  Food Insecurity: Not on file  Transportation Needs: Not on file  Physical Activity: Not on file  Stress: Not on file  Social Connections: Not on file  Intimate Partner Violence: Not on file   Health Maintenance  Topic Date Due   PAP SMEAR-Modifier  12/24/2015   COVID-19 Vaccine (3 - Pfizer risk series) 03/18/2020   INFLUENZA VACCINE  04/16/2021   TETANUS/TDAP  08/31/2029   Hepatitis C Screening  Completed   HIV Screening  Completed   HPV VACCINES  Aged Out    The following portions of the patient's history were  reviewed and updated as appropriate: allergies, current medications, past family history, past medical history, past social history, past surgical history, and problem list.  Review of Systems Pertinent items noted in HPI and remainder of comprehensive ROS otherwise negative.   Objective:    BP 105/70   Pulse 82   Temp 97.8 F (36.6 C)   Ht 5\' 2"  (1.575 m)   Wt 166 lb 3.2 oz (75.4 kg)   SpO2 100%   BMI 30.40 kg/m  General appearance: alert, cooperative, and no distress Head: Normocephalic, without obvious abnormality, atraumatic Eyes: conjunctivae/corneas clear. PERRL, EOM's intact. Fundi benign. Ears: normal TM's and external ear canals both ears Nose: Nares normal. Septum midline. Mucosa normal. No drainage or sinus tenderness. Throat: normal findings: lips normal without lesions, buccal mucosa normal, teeth intact, non-carious, and geographic tongue Neck: no adenopathy, supple, symmetrical, trachea midline, and thyroid: normal to inspection and palpation Back: symmetric, no curvature. ROM normal. No CVA tenderness. Lungs: clear to auscultation bilaterally Heart: regular rate and rhythm, S1, S2 normal, no murmur, click, rub or gallop Abdomen: soft, non-tender; bowel sounds normal; no masses,  no organomegaly Extremities: extremities  normal, atraumatic, no cyanosis or edema Pulses: 2+ and symmetric Skin: mobility and turgor normal and no edema or ecchymoses - scattered, raised and irregular mole Lymph nodes: Cervical adenopathy: normal and Supraclavicular adenopathy: normal Neurologic: Grossly normal    Assessment:    Healthy female exam.     Plan:  -Discussed most recent labs which are essentially within normal limits or stable from prior. Discussed low fat and carbohydrate diet. Encourage to increase physical activity. -Will place referral to hematology for low platelet count, pt does have family hx of VWD. -Will request OB/GYN records for female exam/pap. -Agreeable to  influenza vaccine. -Will place dermatology referral for atypical mole and skin cancer screening.   See After Visit Summary for Counseling Recommendations

## 2021-06-27 NOTE — Patient Instructions (Signed)
Preventive Care 21-34 Years Old, Female Preventive care refers to lifestyle choices and visits with your health care provider that can promote health and wellness. This includes: A yearly physical exam. This is also called an annual wellness visit. Regular dental and eye exams. Immunizations. Screening for certain conditions. Healthy lifestyle choices, such as: Eating a healthy diet. Getting regular exercise. Not using drugs or products that contain nicotine and tobacco. Limiting alcohol use. What can I expect for my preventive care visit? Physical exam Your health care provider may check your: Height and weight. These may be used to calculate your BMI (body mass index). BMI is a measurement that tells if you are at a healthy weight. Heart rate and blood pressure. Body temperature. Skin for abnormal spots. Counseling Your health care provider may ask you questions about your: Past medical problems. Family's medical history. Alcohol, tobacco, and drug use. Emotional well-being. Home life and relationship well-being. Sexual activity. Diet, exercise, and sleep habits. Work and work environment. Access to firearms. Method of birth control. Menstrual cycle. Pregnancy history. What immunizations do I need? Vaccines are usually given at various ages, according to a schedule. Your health care provider will recommend vaccines for you based on your age, medical history, and lifestyle or other factors, such as travel or where you work. What tests do I need? Blood tests Lipid and cholesterol levels. These may be checked every 5 years starting at age 20. Hepatitis C test. Hepatitis B test. Screening Diabetes screening. This is done by checking your blood sugar (glucose) after you have not eaten for a while (fasting). STD (sexually transmitted disease) testing, if you are at risk. BRCA-related cancer screening. This may be done if you have a family history of breast, ovarian, tubal, or  peritoneal cancers. Pelvic exam and Pap test. This may be done every 3 years starting at age 21. Starting at age 30, this may be done every 5 years if you have a Pap test in combination with an HPV test. Talk with your health care provider about your test results, treatment options, and if necessary, the need for more tests. Follow these instructions at home: Eating and drinking  Eat a healthy diet that includes fresh fruits and vegetables, whole grains, lean protein, and low-fat dairy products. Take vitamin and mineral supplements as recommended by your health care provider. Do not drink alcohol if: Your health care provider tells you not to drink. You are pregnant, may be pregnant, or are planning to become pregnant. If you drink alcohol: Limit how much you have to 0-1 drink a day. Be aware of how much alcohol is in your drink. In the [place] one drink equals one 12 oz bottle of beer (355 mL), one 5 oz glass of wine (148 mL), or one 1 oz glass of hard liquor (44 mL). Lifestyle Take daily care of your teeth and gums. Brush your teeth every morning and night with fluoride toothpaste. Floss one time each day. Stay active. Exercise for at least 30 minutes 5 or more days each week. Do not use any products that contain nicotine or tobacco, such as cigarettes, e-cigarettes, and chewing tobacco. If you need help quitting, ask your health care provider. Do not use drugs. If you are sexually active, practice safe sex. Use a condom or other form of protection to prevent STIs (sexually transmitted infections). If you do not wish to become pregnant, use a form of birth control. If you plan to become pregnant, see your health care provider   for a prepregnancy visit. Find healthy ways to cope with stress, such as: Meditation, yoga, or listening to music. Journaling. Talking to a trusted person. Spending time with friends and family. Safety Always wear your seat belt while driving or riding in a  vehicle. Do not drive: If you have been drinking alcohol. Do not ride with someone who has been drinking. When you are tired or distracted. While texting. Wear a helmet and other protective equipment during sports activities. If you have firearms in your house, make sure you follow all gun safety procedures. Seek help if you have been physically or sexually abused. What's next? Go to your health care provider once a year for an annual wellness visit. Ask your health care provider how often you should have your eyes and teeth checked. Stay up to date on all vaccines. This information is not intended to replace advice given to you by your health care provider. Make sure you discuss any questions you have with your health care provider. Document Revised: 11/10/2020 Document Reviewed: 05/14/2018 Elsevier Patient Education  2022 [employer] Inc[employer]  

## 2021-06-29 ENCOUNTER — Telehealth: Payer: Self-pay | Admitting: Hematology and Oncology

## 2021-06-29 NOTE — Telephone Encounter (Signed)
Scheduled appt per 10/12 referral. Pt had seen Dr. Irene Limbo previously, but she requested to see Dr. Lindi Adie per updated referral. Scheduled appt and pt is aware of appt date and time. Msg also sent to MD's letting them know of pt's preference.

## 2021-07-12 ENCOUNTER — Encounter: Payer: Self-pay | Admitting: Physician Assistant

## 2021-07-14 NOTE — Progress Notes (Signed)
[facility] CONSULT NOTE  Patient Care Team: Lorrene Reid, PA-C as PCP - General (Physician Assistant) [name], MD (Internal Medicine) Everlene Farrier, MD as Consulting Physician (Obstetrics and Gynecology)  CHIEF COMPLAINTS/PURPOSE OF CONSULTATION:  Newly diagnosed thrombocytopenia and family history of von Willebrand disease  HISTORY OF PRESENTING ILLNESS:  Tina Wilson 35 y.o. female is here because of recent diagnosis of thrombocytopenia and family history of von Willebrand disease. Labs on 06/19/2021 showed PLT 100. She presents to the clinic today for initial evaluation and discussion of treatment options.  Patient does feel that when she had her second child she had thrombocytopenia with the platelets dipping down into 75 but subsequently she was able to have a C-section and did not experience any excessive bleeding as far she can remember.  She has never had any excessive nosebleeds or gum bleeds however recently her menstrual cycles have become heavier.  Her mother was diagnosed with von Willebrand disease and she thinks she has a mild form. She had a couple of recent viral illnesses.  COVID was in January but most recently she also had some other viral illness that was non-COVID.  I reviewed her records extensively and collaborated the history with the patient.  MEDICAL HISTORY:  Past Medical History:  Diagnosis Date   Allergy    Anxiety    Asthma    Depression    Herpes genitalis in women 2010   Migraine    Thrombocytopenic disorder ([place])     SURGICAL HISTORY: Past Surgical History:  Procedure Laterality Date   CESAREAN SECTION N/A 12/01/2019   Procedure: CESAREAN SECTION;  Surgeon: Everlene Farrier, MD;  Location: [place] [facility] ORS;  Service: Obstetrics;  Laterality: N/A;   ENDOSCOPIC RETROGRADE CHOLANGIOPANCREATOGRAPHY (ERCP) WITH PROPOFOL N/A 09/30/2020   Procedure: ENDOSCOPIC RETROGRADE CHOLANGIOPANCREATOGRAPHY (ERCP) WITH PROPOFOL;   Surgeon: Lucilla Lame, MD;  Location: [facility] ENDOSCOPY;  Service: Endoscopy;  Laterality: N/A;   NO PAST SURGERIES      SOCIAL HISTORY: Social History   Socioeconomic History   Marital status: Married    Spouse name: Christia Reading   Number of children: 1   Years of education: Not on file   Highest education level: Bachelor's degree (e.g., BA, AB, BS)  Occupational History   Occupation: marketing  Tobacco Use   Smoking status: Never   Smokeless tobacco: Never  Vaping Use   Vaping Use: Never used  Substance and Sexual Activity   Alcohol use: Yes    Alcohol/week: 14.0 standard drinks    Types: 10 Glasses of wine, 4 Standard drinks or equivalent per week   Drug use: No    Comment: valtrex   Sexual activity: Yes    Partners: Male    Birth control/protection: None    Comment: married.  Other Topics Concern   Not on file  Social History Narrative   ** Merged History Encounter **       Marital status: married      Children: none      Living with: husband      Employment: works from [employer].      Tobacco: none       Alcohol: 2 glasses wine nightly      Drugs: none      Exercise: sporadically      Patient is right-handed. She lives with her husband and daughter in a 2 story home. She drinks 1-2 cups of coffee a day and rare tea or soda. She does  not exercise.   Social Determinants of Health   Financial Resource Strain: Not on file  Food Insecurity: Not on file  Transportation Needs: Not on file  Physical Activity: Not on file  Stress: Not on file  Social Connections: Not on file  Intimate Partner Violence: Not on file    FAMILY HISTORY: Family History  Problem Relation Age of Onset   Alcohol abuse Maternal Grandmother    Breast cancer Paternal Grandmother    Alcohol abuse Paternal Grandfather    Arthritis Mother    Depression Mother    Hyperlipidemia Father    Hypertension Father    Alcohol abuse Paternal Aunt    Heart attack Maternal Grandfather     Stroke Maternal Grandfather    Cancer Neg Hx    COPD Neg Hx    Diabetes Neg Hx    Drug abuse Neg Hx    Early death Neg Hx    Heart disease Neg Hx    Kidney disease Neg Hx     ALLERGIES:  has No Known Allergies.  MEDICATIONS:  Current Outpatient Medications  Medication Sig Dispense Refill   FLUoxetine (PROZAC) 20 MG capsule Take 1 capsule (20 mg total) by mouth daily. 90 capsule 1   No current facility-administered medications for this visit.    REVIEW OF SYSTEMS:   Constitutional: Denies fevers, chills or abnormal night sweats Eyes: Denies blurriness of vision, double vision or watery eyes Ears, nose, mouth, throat, and face: Denies mucositis or sore throat Respiratory: Denies cough, dyspnea or wheezes Cardiovascular: Denies palpitation, chest discomfort or lower extremity swelling Gastrointestinal:  Denies nausea, heartburn or change in bowel habits Skin: Denies abnormal skin rashes Lymphatics: Denies new lymphadenopathy or easy bruising Neurological:Denies numbness, tingling or new weaknesses Behavioral/Psych: Mood is stable, no new changes  All other systems were reviewed with the patient and are negative.  PHYSICAL EXAMINATION: ECOG PERFORMANCE STATUS: 0 - Asymptomatic  Vitals:   07/16/21 1423  BP: 126/82  Pulse: 71  Resp: 18  Temp: (!) 97.5 F (36.4 C)  SpO2: 99%   Filed Weights   07/16/21 1423  Weight: 163 lb 8 oz (74.2 kg)    GENERAL:alert, no distress and comfortable SKIN: skin color, texture, turgor are normal, no rashes or significant lesions EYES: normal, conjunctiva are pink and non-injected, sclera clear OROPHARYNX:no exudate, no erythema and lips, buccal mucosa, and tongue normal  NECK: supple, thyroid normal size, non-tender, without nodularity LYMPH:  no palpable lymphadenopathy in the cervical, axillary or inguinal LUNGS: clear to auscultation and percussion with normal breathing effort HEART: regular rate & rhythm and no murmurs and no lower  extremity edema ABDOMEN:abdomen soft, non-tender and normal bowel sounds Musculoskeletal:no cyanosis of digits and no clubbing  PSYCH: alert & oriented x 3 with fluent speech NEURO: no focal motor/sensory deficits   LABORATORY DATA:  I have reviewed the data as listed Lab Results  Component Value Date   WBC 5.0 06/19/2021   HGB 13.0 06/19/2021   HCT 39.4 06/19/2021   MCV 91 06/19/2021   PLT 100 (LL) 06/19/2021   Lab Results  Component Value Date   NA 137 06/19/2021   K 4.7 06/19/2021   CL 101 06/19/2021   CO2 21 06/19/2021    RADIOGRAPHIC STUDIES: I have personally reviewed the radiological reports and agreed with the findings in the report.  ASSESSMENT AND PLAN:  Thrombocytopenia ([place]) Lab review: 10/30/2016: 139 11/26/2019: 86 10/02/2020: 158 06/19/2021: 100  Differential diagnosis: 1.  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 medications: I do not see any medications that can cause thrombocytopenia on her list. 3.  Viral illnesses 4.  Bone marrow causes 5.  Spurious due to platelet clumping  Plan: Recheck CBC with differential to confirm the platelet count.  Family history of von Willebrand disease: I discussed with the patient extensively the mechanism of von Willebrand factor and how it prevents bleeding. Patient does not have any clear-cut evidence of von Willebrand factor deficiency.  She had C-section and apparently did not have any excessive bleeding. We will check von Willebrand factor antigen multimers and ristocetin cofactor along with factor VIII.  MyChart virtual visit in 2 weeks to discuss results of these tests.   All questions were answered. The patient knows to call the clinic with any problems, questions or concerns.   Rulon Eisenmenger, MD, MPH 07/16/2021    I, Thana Ates, am acting as scribe for Nicholas Lose, MD.  I have reviewed the above documentation for accuracy and completeness, and I agree with the  above.

## 2021-07-16 ENCOUNTER — Inpatient Hospital Stay: Payer: BC Managed Care – PPO | Attending: Hematology and Oncology | Admitting: Hematology and Oncology

## 2021-07-16 ENCOUNTER — Inpatient Hospital Stay: Payer: BC Managed Care – PPO

## 2021-07-16 ENCOUNTER — Other Ambulatory Visit: Payer: Self-pay

## 2021-07-16 VITALS — BP 126/82 | HR 71 | Temp 97.5°F | Resp 18 | Ht 62.0 in | Wt 163.5 lb

## 2021-07-16 DIAGNOSIS — D696 Thrombocytopenia, unspecified: Secondary | ICD-10-CM

## 2021-07-16 LAB — CBC WITH DIFFERENTIAL (CANCER CENTER ONLY)
Abs Immature Granulocytes: 0.01 10*3/uL (ref 0.00–0.07)
Basophils Absolute: 0 10*3/uL (ref 0.0–0.1)
Basophils Relative: 1 %
Eosinophils Absolute: 0.1 10*3/uL (ref 0.0–0.5)
Eosinophils Relative: 2 %
HCT: 36.3 % (ref 36.0–46.0)
Hemoglobin: 12.4 g/dL (ref 12.0–15.0)
Immature Granulocytes: 0 %
Lymphocytes Relative: 26 %
Lymphs Abs: 1.4 10*3/uL (ref 0.7–4.0)
MCH: 31 pg (ref 26.0–34.0)
MCHC: 34.2 g/dL (ref 30.0–36.0)
MCV: 90.8 fL (ref 80.0–100.0)
Monocytes Absolute: 0.4 10*3/uL (ref 0.1–1.0)
Monocytes Relative: 8 %
Neutro Abs: 3.5 10*3/uL (ref 1.7–7.7)
Neutrophils Relative %: 63 %
Platelet Count: 93 10*3/uL — ABNORMAL LOW (ref 150–400)
RBC: 4 MIL/uL (ref 3.87–5.11)
RDW: 12.1 % (ref 11.5–15.5)
WBC Count: 5.5 10*3/uL (ref 4.0–10.5)
nRBC: 0 % (ref 0.0–0.2)

## 2021-07-16 NOTE — Assessment & Plan Note (Signed)
Lab review: 10/30/2016: 139 11/26/2019: 86 10/02/2020: 158 06/19/2021: 100  Differential diagnosis: 1.  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 medications: I do not see any medications that can cause thrombocytopenia on her list. 3.  Viral illnesses 4.  Bone marrow causes 5.  Spurious due to platelet clumping  Plan: Recheck CBC with differential to confirm the platelet count.  Family history of von Willebrand disease: I discussed with the patient extensively the mechanism of von Willebrand factor and how it prevents bleeding. Patient does not have any clear-cut evidence of von Willebrand factor deficiency.  She had C-section and apparently did not have any excessive bleeding. We will check von Willebrand factor antigen multimers and ristocetin cofactor along with factor VIII.  MyChart virtual visit in 2 weeks to discuss results of these tests.

## 2021-07-17 LAB — VON WILLEBRAND PANEL
Coagulation Factor VIII: 51 % — ABNORMAL LOW (ref 56–140)
Ristocetin Co-factor, Plasma: 62 % (ref 50–200)
Von Willebrand Antigen, Plasma: 61 % (ref 50–200)

## 2021-07-17 LAB — COAG STUDIES INTERP REPORT

## 2021-07-24 ENCOUNTER — Encounter: Payer: Self-pay | Admitting: Physician Assistant

## 2021-07-24 LAB — VON WILLEBRAND FACTOR MULTIMER

## 2021-07-25 ENCOUNTER — Other Ambulatory Visit: Payer: Self-pay

## 2021-07-25 ENCOUNTER — Ambulatory Visit: Payer: BC Managed Care – PPO | Admitting: Physician Assistant

## 2021-07-25 ENCOUNTER — Encounter: Payer: Self-pay | Admitting: Physician Assistant

## 2021-07-25 VITALS — BP 103/64 | HR 58 | Temp 98.4°F | Ht 62.0 in | Wt 169.0 lb

## 2021-07-25 DIAGNOSIS — J0140 Acute pansinusitis, unspecified: Secondary | ICD-10-CM | POA: Diagnosis not present

## 2021-07-25 MED ORDER — DM-GUAIFENESIN ER 30-600 MG PO TB12: 1.0000 | ORAL_TABLET | Freq: Two times a day (BID) | ORAL | 0 refills | Status: DC | PRN

## 2021-07-25 MED ORDER — AZITHROMYCIN 250 MG PO TABS: ORAL_TABLET | ORAL | 0 refills | Status: AC

## 2021-07-25 MED ORDER — PREDNISONE 20 MG PO TABS: ORAL_TABLET | ORAL | 0 refills | Status: DC

## 2021-07-25 NOTE — Progress Notes (Signed)
Acute Office Visit  Subjective:    Patient ID: Nayomi Tabron, female    DOB: 12/09/1985, 35 y.o.   MRN: [id number]  Chief Complaint  Patient presents with   Acute Visit   Sinusitis    HPI Patient is in today for c/o nasal congestion, sinus pressure right sided, mild postnasal drainage, and runny nose. Symptoms started Monday and reports symptoms are progressively getting worse. BC powder has provided mild relief for pressure and headache. Has tried nasal spray (Flonase) which provided minimal relief. Denies fever, cough, sore throat, body aches, chills, night sweats, shortness of breath or wheezing. Did an at home Covid test yesterday which was negative.   Past Medical History:  Diagnosis Date   Allergy    Anxiety    Asthma    Depression    Herpes genitalis in women 2010   Migraine    Thrombocytopenic disorder [facility])     Past Surgical History:  Procedure Laterality Date   CESAREAN SECTION N/A 12/01/2019   Procedure: CESAREAN SECTION;  Surgeon: Everlene Farrier, MD;  Location: [facility];  Service: Obstetrics;  Laterality: N/A;   ENDOSCOPIC RETROGRADE CHOLANGIOPANCREATOGRAPHY (ERCP) WITH PROPOFOL N/A 09/30/2020   Procedure: ENDOSCOPIC RETROGRADE CHOLANGIOPANCREATOGRAPHY (ERCP) WITH PROPOFOL;  Surgeon: Lucilla Lame, MD;  Location: [facility] ENDOSCOPY;  Service: Endoscopy;  Laterality: N/A;   NO PAST SURGERIES      Family History  Problem Relation Age of Onset   Alcohol abuse Maternal Grandmother    Breast cancer Paternal Grandmother    Alcohol abuse Paternal Grandfather    Arthritis Mother    Depression Mother    Hyperlipidemia Father    Hypertension Father    Alcohol abuse Paternal Aunt    Heart attack Maternal Grandfather    Stroke Maternal Grandfather    Cancer Neg Hx    COPD Neg Hx    Diabetes Neg Hx    Drug abuse Neg Hx    Early death Neg Hx    Heart disease Neg Hx    Kidney disease Neg Hx     Social History   Socioeconomic History   Marital status:  Married    Spouse name: Christia Reading   Number of children: 1   Years of education: Not on file   Highest education level: Bachelor's degree (e.g., BA, AB, BS)  Occupational History   Occupation: marketing  Tobacco Use   Smoking status: Never   Smokeless tobacco: Never  Vaping Use   Vaping Use: Never used  Substance and Sexual Activity   Alcohol use: Yes    Alcohol/week: 14.0 standard drinks    Types: 10 Glasses of wine, 4 Standard drinks or equivalent per week   Drug use: No    Comment: valtrex   Sexual activity: Yes    Partners: Male    Birth control/protection: None    Comment: married.  Other Topics Concern   Not on file  Social History Narrative   ** Merged History Encounter **       Marital status: married      Children: none      Living with: husband      Employment: works from [employer].      Tobacco: none       Alcohol: 2 glasses wine nightly      Drugs: none      Exercise: sporadically      Patient is right-handed. She lives with her husband and daughter in a 2 story home. She  drinks 1-2 cups of coffee a day and rare tea or soda. She does not exercise.   Social Determinants of Health   Financial Resource Strain: Not on file  Food Insecurity: Not on file  Transportation Needs: Not on file  Physical Activity: Not on file  Stress: Not on file  Social Connections: Not on file  Intimate Partner Violence: Not on file    Outpatient Medications Prior to Visit  Medication Sig Dispense Refill   FLUoxetine (PROZAC) 20 MG capsule Take 1 capsule (20 mg total) by mouth daily. 90 capsule 1   No facility-administered medications prior to visit.    No Known Allergies  Review of Systems Review of Systems:  A fourteen system review of systems was performed and found to be positive as per HPI.    Objective:    Physical Exam General:  Well Developed, well nourished, appropriate for stated age.  Neuro:  Alert and oriented,  extra-ocular muscles intact   HEENT:  Normocephalic, atraumatic, tenderness of right sided maxillary sinus and temporal side, PERRL, some fluid behind both TM's, negative Tragus sign b/lm boggy turbinate of left nostril, mild erythema of posterior oropharynx w/o exudate, neck supple, +cervical adenopathy (left submandibular gland, mild)  Skin:  no gross rash, warm, pink. Cardiac:  RRR Respiratory:  CTA B/L w/o wheezing,crackles or rales, Not using accessory muscles, speaking in full sentences- unlabored. Vascular:  Ext warm, no cyanosis apprec.; cap RF less 2 sec. Psych:  No HI/SI, judgement and insight good, Euthymic mood. Full Affect.  BP 103/64   Pulse (!) 58   Temp 98.4 F (36.9 C)   Ht $R'5\' 2"'qs$  (1.575 m)   Wt 169 lb (76.7 kg)   SpO2 98%   BMI 30.91 kg/m  Wt Readings from Last 3 Encounters:  07/25/21 169 lb (76.7 kg)  07/16/21 163 lb 8 oz (74.2 kg)  06/27/21 166 lb 3.2 oz (75.4 kg)    Health Maintenance Due  Topic Date Due   Pneumococcal Vaccine 27-29 Years old (1 - PCV) Never done   COVID-19 Vaccine (3 - Pfizer risk series) 03/18/2020    There are no preventive care reminders to display for this patient.   Lab Results  Component Value Date   TSH 1.800 06/19/2021   Lab Results  Component Value Date   WBC 5.5 07/16/2021   HGB 12.4 07/16/2021   HCT 36.3 07/16/2021   MCV 90.8 07/16/2021   PLT 93 (L) 07/16/2021   Lab Results  Component Value Date   NA 137 06/19/2021   K 4.7 06/19/2021   CO2 21 06/19/2021   GLUCOSE 91 06/19/2021   BUN 12 06/19/2021   CREATININE 0.59 06/19/2021   BILITOT 0.3 06/19/2021   ALKPHOS 55 06/19/2021   AST 21 06/19/2021   ALT 16 06/19/2021   PROT 7.5 06/19/2021   ALBUMIN 4.8 06/19/2021   CALCIUM 9.4 06/19/2021   ANIONGAP 6 10/02/2020   EGFR 121 06/19/2021   GFR 128.70 01/11/2013   Lab Results  Component Value Date   CHOL 268 (H) 06/19/2021   Lab Results  Component Value Date   HDL 59 06/19/2021   Lab Results  Component Value Date   LDLCALC 177 (H)  06/19/2021   Lab Results  Component Value Date   TRIG 173 (H) 06/19/2021   Lab Results  Component Value Date   CHOLHDL 4.5 (H) 06/19/2021   Lab Results  Component Value Date   HGBA1C 5.3 06/19/2021       Assessment &  Plan:   Problem List Items Addressed This Visit   None Visit Diagnoses     Acute non-recurrent pansinusitis    -  Primary   Relevant Medications   predniSONE (DELTASONE) 20 MG tablet   azithromycin (ZITHROMAX) 250 MG tablet   dextromethorphan-guaiFENesin (MUCINEX DM) 30-600 MG 12hr tablet      Influenza swab collected in office and resulted negative. Patient has s/sx suggestive of sinusitis and due to severity and worsening symptoms will start decongestant and corticosteroid therapy. Advised patient will provide rx for azithromycin to take if symptoms fail to improve or worsen. Patient verbalized understanding. Recommend supportive care including warm liquids/honey and steamy showers.   Meds ordered this encounter  Medications   predniSONE (DELTASONE) 20 MG tablet    Sig: Take 2 tablets by mouth x 2 days, 1 tablets x 2 days, 0.5 tablet x 2 days    Dispense:  7 tablet    Refill:  0    Order Specific Question:   Supervising Provider    Answer:   Beatrice Lecher D [[id number]]   azithromycin (ZITHROMAX) 250 MG tablet    Sig: Take 2 tablets on day 1, then 1 tablet daily on days 2 through 5    Dispense:  6 tablet    Refill:  0    Order Specific Question:   Supervising Provider    Answer:   Hali Marry [[id number]]   dextromethorphan-guaiFENesin (MUCINEX DM) 30-600 MG 12hr tablet    Sig: Take 1 tablet by mouth 2 (two) times daily as needed for cough.    Dispense:  30 tablet    Refill:  0    Order Specific Question:   Supervising Provider    Answer:   Beatrice Lecher D [[id number]]     Lorrene Reid, PA-C

## 2021-07-25 NOTE — Patient Instructions (Signed)

## 2021-07-30 NOTE — Progress Notes (Signed)
  HEMATOLOGY-ONCOLOGY MYCHART VIDEO VISIT PROGRESS NOTE  I connected with Tina Wilson on 07/31/2021 at  3:15 PM EST by MyChart video conference and verified that I am speaking with the correct person using two identifiers.  I discussed the limitations, risks, security and privacy concerns of performing an evaluation and management service by MyChart and the availability of in person appointments.  I also discussed with the patient that there may be a patient responsible charge related to this service. The patient expressed understanding and agreed to proceed.  Patient's Location: Home Physician Location: Clinic  CHIEF COMPLIANT: Follow-up of thrombocytopenia and family history of von Willebrand disease.  INTERVAL HISTORY: Tina Wilson is a 35 y.o. female with above-mentioned history of thrombocytopenia and family history of von Willebrand disease. Labs on 07/16/2021 showed PLT 93. She presents via MyChart today for follow-up.  No signs or symptoms of bruising or bleeding.  Observations/Objective:  There were no vitals filed for this visit. There is no height or weight on file to calculate BMI.  I have reviewed the data as listed CMP Latest Ref Rng & Units 06/19/2021 11/23/2020 10/03/2020  Glucose 70 - 99 mg/dL 91 87 -  BUN 6 - 20 mg/dL 12 10 -  Creatinine 0.57 - 1.00 mg/dL 0.59 0.74 -  Sodium 134 - 144 mmol/L 137 138 -  Potassium 3.5 - 5.2 mmol/L 4.7 4.4 -  Chloride 96 - 106 mmol/L 101 100 -  CO2 20 - 29 mmol/L 21 22 -  Calcium 8.7 - 10.2 mg/dL 9.4 9.2 -  Total Protein 6.0 - 8.5 g/dL 7.5 7.5 6.1(L)  Total Bilirubin 0.0 - 1.2 mg/dL 0.3 0.5 1.7(H)  Alkaline Phos 44 - 121 IU/L 55 65 342(H)  AST 0 - 40 IU/L 21 16 78(H)  ALT 0 - 32 IU/L 16 17 485(H)    Lab Results  Component Value Date   WBC 5.5 07/16/2021   HGB 12.4 07/16/2021   HCT 36.3 07/16/2021   MCV 90.8 07/16/2021   PLT 93 (L) 07/16/2021   NEUTROABS 3.5 07/16/2021      Assessment Plan:   Thrombocytopenia ([place])  Lab review: 10/30/2016: 139 11/26/2019: 86 10/02/2020: 158 06/19/2021: 100 07/16/2021: Platelets 93 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  There is no indication to treat at this number.  This can be watched and monitored.  Family history of von Willebrand disease: 07/16/2021: Von Willebrand factor antigen, multimers, VWF: RCL ratio is normal, factor VIII slightly decreased 51% Therefore patient does not have von Willebrand disease.  Return to clinic every 3 months with labs in 6 months for follow-up with me.    I discussed the assessment and treatment plan with the patient. The patient was provided an opportunity to ask questions and all were answered. The patient agreed with the plan and demonstrated an understanding of the instructions. The patient was advised to call back or seek an in-person evaluation if the symptoms worsen or if the condition fails to improve as anticipated.   Total time spent: 20 minutes including face-to-face MyChart video visit time and time spent for planning, charting and coordination of care  Rulon Eisenmenger, MD 07/31/2021  I, Thana Ates am acting as scribe for Nicholas Lose, MD.  I have reviewed the above documentation for accuracy and completeness, and I agree with the above.

## 2021-07-31 ENCOUNTER — Inpatient Hospital Stay: Payer: BC Managed Care – PPO | Attending: Hematology and Oncology | Admitting: Hematology and Oncology

## 2021-07-31 ENCOUNTER — Other Ambulatory Visit: Payer: Self-pay

## 2021-07-31 DIAGNOSIS — D696 Thrombocytopenia, unspecified: Secondary | ICD-10-CM

## 2021-07-31 NOTE — Assessment & Plan Note (Signed)
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  There is no indication to treat at this number.  This can be watched and monitored.  Family history of von Willebrand disease: 07/16/2021: Von Willebrand factor antigen, multimers, VWF: RCL ratio is normal, factor VIII slightly decreased 51% Therefore patient does not have von Willebrand disease.  Return to clinic every 3 months with labs in 6 months for follow-up with me.

## 2021-08-06 ENCOUNTER — Encounter: Payer: Self-pay | Admitting: Physician Assistant

## 2021-08-30 ENCOUNTER — Other Ambulatory Visit: Payer: Self-pay | Admitting: Nurse Practitioner

## 2021-08-30 DIAGNOSIS — Z8659 Personal history of other mental and behavioral disorders: Secondary | ICD-10-CM

## 2021-09-18 ENCOUNTER — Other Ambulatory Visit: Payer: Self-pay

## 2021-09-18 ENCOUNTER — Ambulatory Visit (INDEPENDENT_AMBULATORY_CARE_PROVIDER_SITE_OTHER): Payer: BC Managed Care – PPO | Admitting: Nurse Practitioner

## 2021-09-18 ENCOUNTER — Encounter: Payer: Self-pay | Admitting: Nurse Practitioner

## 2021-09-18 VITALS — BP 104/72 | HR 81 | Temp 98.1°F | Ht 62.0 in | Wt 166.6 lb

## 2021-09-18 DIAGNOSIS — J029 Acute pharyngitis, unspecified: Secondary | ICD-10-CM | POA: Diagnosis not present

## 2021-09-18 LAB — POCT RAPID STREP A (OFFICE): Rapid Strep A Screen: NEGATIVE

## 2021-09-18 MED ORDER — AMOXICILLIN 875 MG PO TABS: 875.0000 mg | ORAL_TABLET | Freq: Two times a day (BID) | ORAL | 0 refills | Status: DC

## 2021-09-18 NOTE — Addendum Note (Signed)
Addended by: Vivia Birmingham on: 09/18/2021 04:14 PM   Modules accepted: Orders

## 2021-09-18 NOTE — Progress Notes (Signed)
Acute Office Visit  Subjective:    Patient ID: Tina Wilson, female    DOB: [birth year], 36 y.o.   MRN: [id number]  Chief Complaint  Patient presents with   Ear Pain   Sore Throat    The patient states that she did a home test for COVID 19 and results were negative.   Sore Throat  This is a new problem. The current episode started in the past 7 days. The problem has been unchanged. Neither side of throat is experiencing more pain than the other. There has been no fever. Associated symptoms include coughing, diarrhea, ear pain, a plugged ear sensation, neck pain and swollen glands. Pertinent negatives include no abdominal pain, congestion, headaches, hoarse voice, shortness of breath or vomiting. She has had no exposure to strep. She has tried acetaminophen (NyQuil has been helping until last night. last night she coitnued to have pain.) for the symptoms. The treatment provided mild relief.    Past Medical History:  Diagnosis Date   Allergy    Anxiety    Asthma    Depression    Herpes genitalis in women 2010   Migraine    Thrombocytopenic disorder [facility])     Past Surgical History:  Procedure Laterality Date   CESAREAN SECTION N/A 12/01/2019   Procedure: CESAREAN SECTION;  Surgeon: Everlene Farrier, MD;  Location: [facility] LD ORS;  Service: Obstetrics;  Laterality: N/A;   ENDOSCOPIC RETROGRADE CHOLANGIOPANCREATOGRAPHY (ERCP) WITH PROPOFOL N/A 09/30/2020   Procedure: ENDOSCOPIC RETROGRADE CHOLANGIOPANCREATOGRAPHY (ERCP) WITH PROPOFOL;  Surgeon: Lucilla Lame, MD;  Location: [facility] ENDOSCOPY;  Service: Endoscopy;  Laterality: N/A;   NO PAST SURGERIES      Family History  Problem Relation Age of Onset   Alcohol abuse Maternal Grandmother    Breast cancer Paternal Grandmother    Alcohol abuse Paternal Grandfather    Arthritis Mother    Depression Mother    Hyperlipidemia Father    Hypertension Father    Alcohol abuse Paternal Aunt    Heart attack Maternal Grandfather     Stroke Maternal Grandfather    Cancer Neg Hx    COPD Neg Hx    Diabetes Neg Hx    Drug abuse Neg Hx    Early death Neg Hx    Heart disease Neg Hx    Kidney disease Neg Hx     Social History   Socioeconomic History   Marital status: Married    Spouse name: Christia Reading   Number of children: 1   Years of education: Not on file   Highest education level: Bachelor's degree (e.g., BA, AB, BS)  Occupational History   Occupation: marketing  Tobacco Use   Smoking status: Never   Smokeless tobacco: Never  Vaping Use   Vaping Use: Never used  Substance and Sexual Activity   Alcohol use: Yes    Alcohol/week: 14.0 standard drinks    Types: 10 Glasses of wine, 4 Standard drinks or equivalent per week   Drug use: No    Comment: valtrex   Sexual activity: Yes    Partners: Male    Birth control/protection: None    Comment: married.  Other Topics Concern   Not on file  Social History Narrative   ** Merged History Encounter **       Marital status: married      Children: none      Living with: husband      Employment: works from [employer].  Tobacco: none       Alcohol: 2 glasses wine nightly      Drugs: none      Exercise: sporadically      Patient is right-handed. She lives with her husband and daughter in a 2 story home. She drinks 1-2 cups of coffee a day and rare tea or soda. She does not exercise.   Social Determinants of Health   Financial Resource Strain: Not on file  Food Insecurity: Not on file  Transportation Needs: Not on file  Physical Activity: Not on file  Stress: Not on file  Social Connections: Not on file  Intimate Partner Violence: Not on file    Outpatient Medications Prior to Visit  Medication Sig Dispense Refill   FLUoxetine (PROZAC) 20 MG capsule TAKE 1 CAPSULE BY MOUTH EVERY DAY 90 capsule 1   dextromethorphan-guaiFENesin (MUCINEX DM) 30-600 MG 12hr tablet Take 1 tablet by mouth 2 (two) times daily as needed for cough. (Patient not  taking: Reported on 09/18/2021) 30 tablet 0   predniSONE (DELTASONE) 20 MG tablet Take 2 tablets by mouth x 2 days, 1 tablets x 2 days, 0.5 tablet x 2 days (Patient not taking: Reported on 09/18/2021) 7 tablet 0   No facility-administered medications prior to visit.    No Known Allergies  Review of Systems  Constitutional:  Negative for activity change, appetite change, chills, fatigue and fever.  HENT:  Positive for ear pain and sore throat. Negative for congestion, hoarse voice, postnasal drip, rhinorrhea, sinus pressure, sinus pain and sneezing.   Eyes: Negative.   Respiratory:  Positive for cough. Negative for chest tightness, shortness of breath and wheezing.   Cardiovascular:  Negative for chest pain and palpitations.  Gastrointestinal:  Positive for diarrhea. Negative for abdominal pain, constipation, nausea and vomiting.  Endocrine: Negative for cold intolerance, heat intolerance, polydipsia and polyuria.  Genitourinary:  Negative for dyspareunia, dysuria, flank pain, frequency and urgency.  Musculoskeletal:  Positive for neck pain. Negative for arthralgias, back pain and myalgias.  Skin:  Negative for rash.  Allergic/Immunologic: Negative for environmental allergies.  Neurological:  Negative for dizziness, weakness and headaches.  Hematological:  Positive for adenopathy.  Psychiatric/Behavioral:  The patient is not nervous/anxious.       Objective:    Physical Exam Vitals and nursing note reviewed.  Constitutional:      Appearance: Normal appearance. She is well-developed. She is ill-appearing.  HENT:     Head: Normocephalic.     Right Ear: Hearing, ear canal and external ear normal. Tympanic membrane is bulging.     Left Ear: Hearing, ear canal and external ear normal. Tympanic membrane is bulging.     Nose: Nose normal.     Mouth/Throat:     Pharynx: Posterior oropharyngeal erythema present.     Tonsils: 1+ on the right. 1+ on the left.     Comments: Right side more  erythematous than the left  Eyes:     Pupils: Pupils are equal, round, and reactive to light.  Cardiovascular:     Rate and Rhythm: Normal rate and regular rhythm.     Pulses: Normal pulses.     Heart sounds: Normal heart sounds.  Pulmonary:     Effort: Pulmonary effort is normal.     Breath sounds: Normal breath sounds.  Abdominal:     Palpations: Abdomen is soft.  Musculoskeletal:        General: Normal range of motion.     Cervical back: Normal range  of motion and neck supple.  Lymphadenopathy:     Cervical: Cervical adenopathy present.  Skin:    General: Skin is warm and dry.     Capillary Refill: Capillary refill takes less than 2 seconds.  Neurological:     General: No focal deficit present.     Mental Status: She is alert and oriented to person, place, and time.  Psychiatric:        Mood and Affect: Mood normal.        Behavior: Behavior normal.        Thought Content: Thought content normal.        Judgment: Judgment normal.   BP 104/72    Pulse 81    Temp 98.1 F (36.7 C)    Ht 5' 2"  (1.575 m)    Wt 166 lb 9.6 oz (75.6 kg)    LMP 09/04/2021    SpO2 98%    BMI 30.47 kg/m  Wt Readings from Last 3 Encounters:  09/18/21 166 lb 9.6 oz (75.6 kg)  07/25/21 169 lb (76.7 kg)  07/16/21 163 lb 8 oz (74.2 kg)    Health Maintenance Due  Topic Date Due   Pneumococcal Vaccine 26-47 Years old (1 - PCV) Never done   COVID-19 Vaccine (3 - Pfizer risk series) 03/18/2020    There are no preventive care reminders to display for this patient.   Lab Results  Component Value Date   TSH 1.800 06/19/2021   Lab Results  Component Value Date   WBC 5.5 07/16/2021   HGB 12.4 07/16/2021   HCT 36.3 07/16/2021   MCV 90.8 07/16/2021   PLT 93 (L) 07/16/2021   Lab Results  Component Value Date   NA 137 06/19/2021   K 4.7 06/19/2021   CO2 21 06/19/2021   GLUCOSE 91 06/19/2021   BUN 12 06/19/2021   CREATININE 0.59 06/19/2021   BILITOT 0.3 06/19/2021   ALKPHOS 55 06/19/2021    AST 21 06/19/2021   ALT 16 06/19/2021   PROT 7.5 06/19/2021   ALBUMIN 4.8 06/19/2021   CALCIUM 9.4 06/19/2021   ANIONGAP 6 10/02/2020   EGFR 121 06/19/2021   GFR 128.70 01/11/2013   Lab Results  Component Value Date   CHOL 268 (H) 06/19/2021   Lab Results  Component Value Date   HDL 59 06/19/2021   Lab Results  Component Value Date   LDLCALC 177 (H) 06/19/2021   Lab Results  Component Value Date   TRIG 173 (H) 06/19/2021   Lab Results  Component Value Date   CHOLHDL 4.5 (H) 06/19/2021   Lab Results  Component Value Date   HGBA1C 5.3 06/19/2021       Assessment & Plan:   Problem List Items Addressed This Visit   None    No orders of the defined types were placed in this encounter.    Ronnell Freshwater, NP

## 2021-12-18 ENCOUNTER — Ambulatory Visit: Payer: BC Managed Care – PPO | Admitting: Dermatology

## 2021-12-19 ENCOUNTER — Ambulatory Visit: Payer: BC Managed Care – PPO | Admitting: Dermatology

## 2021-12-19 DIAGNOSIS — Z1283 Encounter for screening for malignant neoplasm of skin: Secondary | ICD-10-CM

## 2021-12-19 DIAGNOSIS — D2272 Melanocytic nevi of left lower limb, including hip: Secondary | ICD-10-CM

## 2021-12-19 DIAGNOSIS — L905 Scar conditions and fibrosis of skin: Secondary | ICD-10-CM | POA: Diagnosis not present

## 2021-12-19 DIAGNOSIS — D225 Melanocytic nevi of trunk: Secondary | ICD-10-CM

## 2021-12-19 DIAGNOSIS — L720 Epidermal cyst: Secondary | ICD-10-CM | POA: Diagnosis not present

## 2021-12-19 DIAGNOSIS — I781 Nevus, non-neoplastic: Secondary | ICD-10-CM

## 2021-12-19 DIAGNOSIS — L814 Other melanin hyperpigmentation: Secondary | ICD-10-CM

## 2021-12-19 DIAGNOSIS — L821 Other seborrheic keratosis: Secondary | ICD-10-CM

## 2021-12-19 DIAGNOSIS — L578 Other skin changes due to chronic exposure to nonionizing radiation: Secondary | ICD-10-CM

## 2021-12-19 DIAGNOSIS — D229 Melanocytic nevi, unspecified: Secondary | ICD-10-CM

## 2021-12-19 DIAGNOSIS — D492 Neoplasm of unspecified behavior of bone, soft tissue, and skin: Secondary | ICD-10-CM

## 2021-12-19 DIAGNOSIS — D1800 Hemangioma unspecified site: Secondary | ICD-10-CM

## 2021-12-19 NOTE — Patient Instructions (Addendum)

## 2021-12-19 NOTE — Progress Notes (Signed)
? ?New Patient Visit ? ?Subjective  ?Tina Wilson is a 36 y.o. female who presents for the following: Annual Exam (The patient presents for Total-Body Skin Exam (TBSE) for skin cancer screening and mole check.  The patient has spots, moles and lesions to be evaluated, some may be new or changing and the patient has concerns that these could be cancer. ). ? ?The following portions of the chart were reviewed this encounter and updated as appropriate:  ? Tobacco  Allergies  Meds  Problems  Med Hx  Surg Hx  Fam Hx   ?  ?Review of Systems:  No other skin or systemic complaints except as noted in HPI or Assessment and Plan. ? ?Objective  ?Well appearing patient in no apparent distress; mood and affect are within normal limits. ? ?A full examination was performed including scalp, head, eyes, ears, nose, lips, neck, chest, axillae, abdomen, back, buttocks, bilateral upper extremities, bilateral lower extremities, hands, feet, fingers, toes, fingernails, and toenails. All findings within normal limits unless otherwise noted below. ? ?left cheek ? Dilated blood vessel ? ?left shoulder ?Dyspigmented smooth macule or patch.  ? ?right scapula, left scapula ?Subcutaneous nodule.  ? ?Right mid back paraspinal ?0.6 x 0.4 cm regular brown macule  ? ? ? ? ? ? ?left 4th toe ?Tan-brown and/or pink-flesh-colored symmetric macule ? ? ? ? ? ? ? ?Assessment & Plan  ?Telangiectasia ?left cheek ?Telangiectasia ?- Dilated blood vessel ?- Benign appearing on exam ?- Call for changes  ?Discussed the treatment option of BBL/laser.  Typically we recommend 1-3 treatment sessions about 5-8 weeks apart for best results.  The patient's condition may require "maintenance treatments" in the future.  The fee for BBL / laser treatments is $200 per treatment session.  A fee can be quoted for other parts of the body. ?Insurance typically does not pay for BBL/laser treatments and therefore the fee is an out-of-pocket cost.  ? ?Scar ?left  shoulder ?Scar from previous cyst removal with I&D ?Observe  ? ?Epidermal inclusion cyst ?right scapula, left scapula ? Benign-appearing. Exam most consistent with an epidermal inclusion cyst. Discussed that a cyst is a benign growth that can grow over time and sometimes get irritated or inflamed. Recommend observation if it is not bothersome. Discussed option of surgical excision to remove it if it is growing, symptomatic, or other changes noted. Please call for new or changing lesions so they can be evaluated. ?  ?Neoplasm of skin ?Right mid back paraspinal ?Epidermal / dermal shaving ? ?Lesion diameter (cm):  0.4 ?Informed consent: discussed and consent obtained   ?Timeout: patient name, date of birth, surgical site, and procedure verified   ?Procedure prep:  Patient was prepped and draped in usual sterile fashion ?Prep type:  Isopropyl alcohol ?Anesthesia: the lesion was anesthetized in a standard fashion   ?Anesthetic:  1% lidocaine w/ epinephrine 1-100,000 buffered w/ 8.4% NaHCO3 ?Hemostasis achieved with: pressure, aluminum chloride and electrodesiccation   ?Outcome: patient tolerated procedure well   ?Post-procedure details: sterile dressing applied and wound care instructions given   ?Dressing type: bandage and petrolatum   ? ?Specimen 1 - Surgical pathology ?Differential Diagnosis: R/O Dysplastic nevus  ?Check Margins: No ? ?Nevus ?left 4th toe ?See photo ?Benign-appearing.  Observation.  Call clinic for new or changing lesions.  Recommend daily use of broad spectrum spf 30+ sunscreen to sun-exposed areas.  ? ?Lentigines ?- Scattered tan macules ?- Due to sun exposure ?- Benign-appearing, observe ?- Recommend daily broad  spectrum sunscreen SPF 30+ to sun-exposed areas, reapply every 2 hours as needed. ?- Call for any changes ? ?Seborrheic Keratoses ?- Stuck-on, waxy, tan-brown papules and/or plaques  ?- Benign-appearing ?- Discussed benign etiology and prognosis. ?- Observe ?- Call for any  changes ? ?Melanocytic Nevi ?- Tan-brown and/or pink-flesh-colored symmetric macules and papules ?- Benign appearing on exam today ?- Observation ?- Call clinic for new or changing moles ?- Recommend daily use of broad spectrum spf 30+ sunscreen to sun-exposed areas.  ? ?Hemangiomas ?- Red papules ?- Discussed benign nature ?- Observe ?- Call for any changes ? ?Actinic Damage ?- Chronic condition, secondary to cumulative UV/sun exposure ?- diffuse scaly erythematous macules with underlying dyspigmentation ?- Recommend daily broad spectrum sunscreen SPF 30+ to sun-exposed areas, reapply every 2 hours as needed.  ?- Staying in the shade or wearing long sleeves, sun glasses (UVA+UVB protection) and wide brim hats (4-inch brim around the entire circumference of the hat) are also recommended for sun protection.  ?- Call for new or changing lesions. ? ?Acrochordons (Skin Tags) ?- Fleshy, skin-colored pedunculated papules ?- Benign appearing.  ?- Observe. ?- If desired, they can be removed with an in office procedure that is not covered by insurance. ?- Please call the clinic if you notice any new or changing lesions.  ? ?Skin cancer screening performed today.  ? ?Return in about 1 year (around 12/20/2022) for TBSE. ? ?I, Marye Round, CMA, am acting as scribe for Sarina Ser, MD .  ?Documentation: I have reviewed the above documentation for accuracy and completeness, and I agree with the above. ? ?Sarina Ser, MD ? ?

## 2021-12-20 ENCOUNTER — Encounter: Payer: Self-pay | Admitting: Dermatology

## 2021-12-20 DIAGNOSIS — D239 Other benign neoplasm of skin, unspecified: Secondary | ICD-10-CM

## 2021-12-20 HISTORY — DX: Other benign neoplasm of skin, unspecified: D23.9

## 2021-12-24 ENCOUNTER — Telehealth: Payer: Self-pay

## 2021-12-24 NOTE — Telephone Encounter (Signed)
-----   Message from Ralene Bathe, MD sent at 12/20/2021  6:03 PM EDT ----- ?Diagnosis ?Skin , right mid back paraspinal ?DYSPLASTIC COMPOUND NEVUS WITH MODERATE ATYPIA, CLOSE TO MARGIN ? ?Moderate dysplastic ?Recheck next visit ?

## 2021-12-24 NOTE — Telephone Encounter (Signed)
Patient advised of BX results .aw 

## 2021-12-24 NOTE — Telephone Encounter (Signed)
Left message on voicemail to return my call.  

## 2022-03-21 ENCOUNTER — Other Ambulatory Visit: Payer: Self-pay | Admitting: Physician Assistant

## 2022-03-21 DIAGNOSIS — Z8659 Personal history of other mental and behavioral disorders: Secondary | ICD-10-CM

## 2022-04-19 ENCOUNTER — Ambulatory Visit
Admission: EM | Admit: 2022-04-19 | Discharge: 2022-04-19 | Disposition: A | Discharge status: Home / Self Care | Payer: BC Managed Care – PPO | Attending: Physician Assistant | Admitting: Physician Assistant

## 2022-04-19 DIAGNOSIS — H65192 Other acute nonsuppurative otitis media, left ear: Secondary | ICD-10-CM

## 2022-04-19 MED ORDER — AMOXICILLIN-POT CLAVULANATE 875-125 MG PO TABS: 1.0000 | ORAL_TABLET | Freq: Two times a day (BID) | ORAL | 0 refills | Status: DC

## 2022-04-19 MED ORDER — FLUTICASONE PROPIONATE 50 MCG/ACT NA SUSP: 1.0000 | Freq: Every day | NASAL | 0 refills | Status: DC

## 2022-04-19 NOTE — ED Provider Notes (Signed)
[name]    CSN: [id number] Arrival date & time: 04/19/22  1112      History   Chief Complaint Chief Complaint  Patient presents with   Ear Fullness    HPI Tina Wilson is a 36 y.o. female.   Patient here today for evaluation of bilateral ear fullness and decreased hearing. She reports she has started to have some headache frontally as well as left ear pain today. She denies any fever. She has not had cough or sore throat. She has tried OTC treatment without significant relief.   The history is provided by the patient.    Past Medical History:  Diagnosis Date   Allergy    Anxiety    Asthma    Depression    Dysplastic nevus 12/20/2021   right mid back paraspinal - moderate   Herpes genitalis in women 2010   Migraine    Thrombocytopenic disorder [facility])     Patient Active Problem List   Diagnosis Date Noted   Right upper quadrant abdominal pain    Choledocholithiasis 09/30/2020   Increased liver enzymes    Term pregnancy 12/01/2019   Rhinosinusitis 06/01/2019   Thrombocytopenia ([place]) 08/04/2018   Elevated LDL cholesterol level 08/04/2018   Sore throat 07/20/2018   Cough 07/20/2018   Migraine without aura and without status migrainosus, not intractable 06/23/2018   Post-dates pregnancy 10/30/2016   Healthcare maintenance 01/11/2013    Past Surgical History:  Procedure Laterality Date   CESAREAN SECTION N/A 12/01/2019   Procedure: CESAREAN SECTION;  Surgeon: Everlene Farrier, MD;  Location: [place] LD ORS;  Service: Obstetrics;  Laterality: N/A;   ENDOSCOPIC RETROGRADE CHOLANGIOPANCREATOGRAPHY (ERCP) WITH PROPOFOL N/A 09/30/2020   Procedure: ENDOSCOPIC RETROGRADE CHOLANGIOPANCREATOGRAPHY (ERCP) WITH PROPOFOL;  Surgeon: Lucilla Lame, MD;  Location: [facility] ENDOSCOPY;  Service: Endoscopy;  Laterality: N/A;   NO PAST SURGERIES      OB History     Gravida  2   Para  2   Term  2   Preterm      AB      Living  2      SAB      IAB       Ectopic      Multiple  0   Live Births  2            Home Medications    Prior to Admission medications   Medication Sig Start Date End Date Taking? Authorizing Provider  amoxicillin-clavulanate (AUGMENTIN) 875-125 MG tablet Take 1 tablet by mouth every 12 (twelve) hours. 04/19/22  Yes Francene Finders, PA-C  fluticasone (FLONASE) 50 MCG/ACT nasal spray Place 1 spray into both nostrils daily for 14 days. 04/19/22 05/03/22 Yes Francene Finders, PA-C  dextromethorphan-guaiFENesin [facility] DM) 30-600 MG 12hr tablet Take 1 tablet by mouth 2 (two) times daily as needed for cough. Patient not taking: Reported on 09/18/2021 07/25/21   Lorrene Reid, PA-C  FLUoxetine (PROZAC) 20 MG capsule TAKE 1 CAPSULE BY MOUTH EVERY DAY 03/21/22   Lorrene Reid, PA-C  predniSONE (DELTASONE) 20 MG tablet Take 2 tablets by mouth x 2 days, 1 tablets x 2 days, 0.5 tablet x 2 days Patient not taking: Reported on 09/18/2021 07/25/21   Lorrene Reid, PA-C    Family History Family History  Problem Relation Age of Onset   Alcohol abuse Maternal Grandmother    Breast cancer Paternal Grandmother    Alcohol abuse Paternal Grandfather    Arthritis Mother  Depression Mother    Hyperlipidemia Father    Hypertension Father    Alcohol abuse Paternal Aunt    Heart attack Maternal Grandfather    Stroke Maternal Grandfather    Cancer Neg Hx    COPD Neg Hx    Diabetes Neg Hx    Drug abuse Neg Hx    Early death Neg Hx    Heart disease Neg Hx    Kidney disease Neg Hx     Social History Social History   Tobacco Use   Smoking status: Never   Smokeless tobacco: Never  Vaping Use   Vaping Use: Never used  Substance Use Topics   Alcohol use: Yes    Alcohol/week: 14.0 standard drinks of alcohol    Types: 10 Glasses of wine, 4 Standard drinks or equivalent per week   Drug use: No    Comment: valtrex     Allergies   Patient has no known allergies.   Review of Systems Review of Systems  Constitutional:   Negative for chills and fever.  HENT:  Positive for ear pain and hearing loss. Negative for ear discharge.   Eyes:  Negative for discharge and redness.  Respiratory:  Negative for cough and shortness of breath.   Gastrointestinal:  Negative for abdominal pain, nausea and vomiting.  Neurological:  Positive for headaches.     Physical Exam Triage Vital Signs ED Triage Vitals [04/19/22 1121]  Enc Vitals Group     BP 118/76     Pulse Rate 74     Resp 18     Temp 98 F (36.7 C)     Temp Source Oral     SpO2 98 %     Weight      Height      Head Circumference      Peak Flow      Pain Score      Pain Loc      Pain Edu?      Excl. in [place]?    No data found.  Updated Vital Signs BP 118/76 (BP Location: Left Arm)   Pulse 74   Temp 98 F (36.7 C) (Oral)   Resp 18   LMP 04/02/2022   SpO2 98%      Physical Exam Vitals and nursing note reviewed.  Constitutional:      General: She is not in acute distress.    Appearance: Normal appearance. She is not ill-appearing.  HENT:     Head: Normocephalic and atraumatic.     Ears:     Comments: Bilateral TM s dull, left TM injected    Nose: Congestion (mild) present.  Eyes:     Conjunctiva/sclera: Conjunctivae normal.  Cardiovascular:     Rate and Rhythm: Normal rate.  Pulmonary:     Effort: Pulmonary effort is normal.  Neurological:     Mental Status: She is alert.  Psychiatric:        Mood and Affect: Mood normal.        Behavior: Behavior normal.        Thought Content: Thought content normal.      UC Treatments / Results  Labs (all labs ordered are listed, but only abnormal results are displayed) Labs Reviewed - No data to display  EKG   Radiology No results found.  Procedures Procedures (including critical care time)  Medications Ordered in UC Medications - No data to display  Initial Impression / Assessment and Plan / UC Course  I  have reviewed the triage vital signs and the nursing notes.  Pertinent  labs & imaging results that were available during my care of the patient were reviewed by me and considered in my medical decision making (see chart for details).    Augmentin prescribed to cover otitis media as well as possible impending sinusitis given reported headaches that have started. Flonase to help with symptom relief, treat possible ETD as well. Encouraged follow up if no improvement or if symptoms worsen in any way.   Final Clinical Impressions(s) / UC Diagnoses   Final diagnoses:  Other acute nonsuppurative otitis media of left ear, recurrence not specified   Discharge Instructions   None    ED Prescriptions     Medication Sig Dispense Auth. Provider   fluticasone (FLONASE) 50 MCG/ACT nasal spray Place 1 spray into both nostrils daily for 14 days. 15.8 g Ewell Poe F, PA-C   amoxicillin-clavulanate (AUGMENTIN) 875-125 MG tablet Take 1 tablet by mouth every 12 (twelve) hours. 14 tablet Francene Finders, PA-C      PDMP not reviewed this encounter.   Francene Finders, PA-C 04/19/22 1133

## 2022-04-19 NOTE — ED Triage Notes (Signed)
Pt presents with bilateral ear fullness for over a week.

## 2022-08-03 IMAGING — MR MR ABDOMEN WO/W CM MRCP
20 of 21 series · 45 of 48 positions shown · IV contrast (gadavist)
Comparison: 09/30/2020

CLINICAL DATA: Jaundice. Worsening hyperbilirubinemia. One day
postop from laparoscopic cholecystectomy.

EXAM:
MRI ABDOMEN WITHOUT AND WITH CONTRAST (INCLUDING MRCP)
TECHNIQUE: Multiplanar multisequence MR imaging of the abdomen was performed
both before and after the administration of intravenous contrast.
Heavily T2-weighted images of the biliary and pancreatic ducts were
obtained, and three-dimensional MRCP images were rendered by post
processing.
CONTRAST:  7mL GADAVIST GADOBUTROL 1 MMOL/ML IV SOLN

[Series 3: T2 · coronal · 6.0mm · 1.19mm/px · 2 of 30 slices shown (1 of 2)]
[im 1/30]
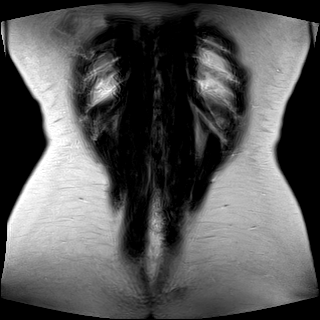
[im 30/30]
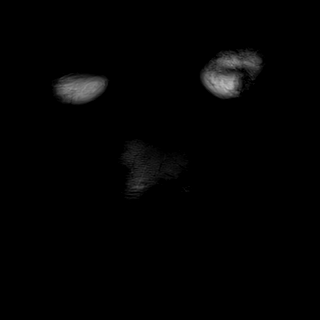

[Series 4: T2 · axial · 6.0mm · 1.19mm/px · z∈[-11,+241]mm · 2 of 36 slices shown (2 of 2)]
[im 1/36]
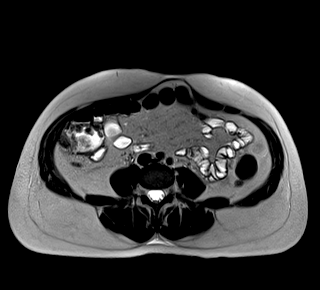
[im 36/36]
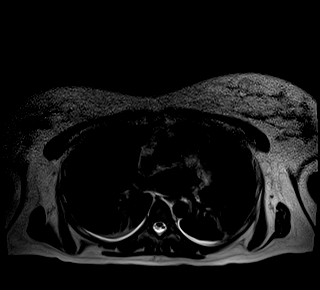

[Series 8: T2 fat-sat · axial · 6.0mm · 1.19mm/px · 1 of 32 slices shown]
[im 1/32]
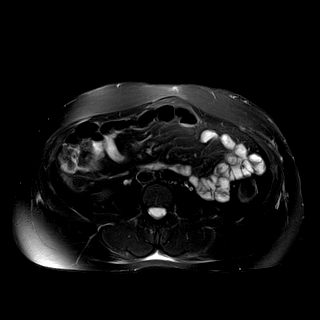

[Series 9: ax dwi_tracew · axial · 6.0mm · 1.42mm/px · z∈[-3,+234]mm · 4 of 102 slices shown]
[im 1/102]
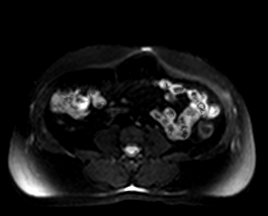
[im 34/102]
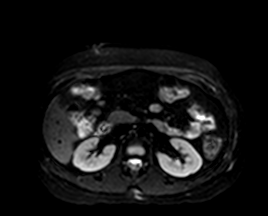
[im 68/102]
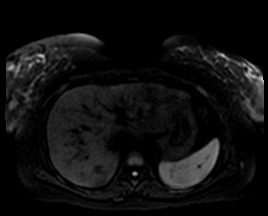
[im 102/102]
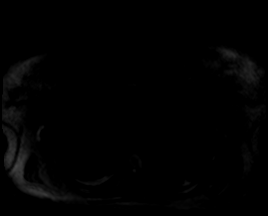

[Series 10: ax dwi_adc · axial · 6.0mm · 1.42mm/px · 1 of 34 slices shown]
[im 1/34]
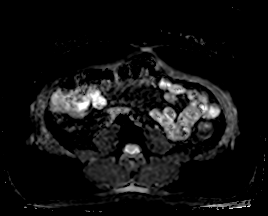

[Series 11: MRCP · coronal · 3.0mm · 1.12mm/px · 1 of 17 slices shown]
[im 1/17]
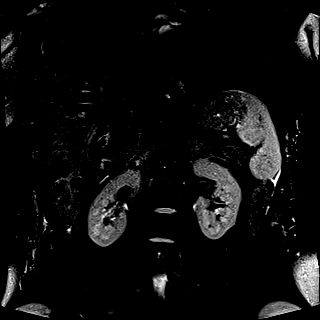

[Series 16: radials · coronal · 50.0mm · 0.78mm/px · 1 of 5 slices shown]
[im 1/5]
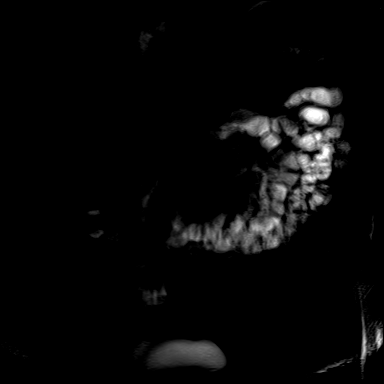

[Series 17: T1 dynamic fat-sat · axial · non-contrast · 3.0mm · 1.19mm/px · z∈[+2,+239]mm · 3 of 80 slices shown (1 of 5)]
[im 1/80]
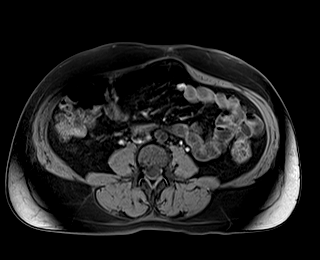
[im 40/80]
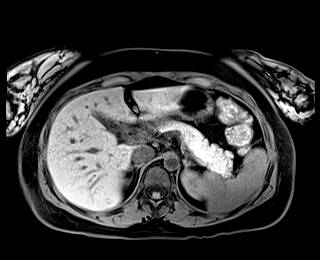
[im 80/80]
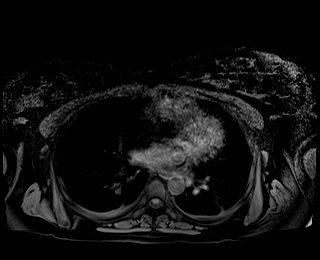

[Series 18: T1 dynamic fat-sat post-contrast · axial · 3.0mm · 1.19mm/px · z∈[+2,+239]mm · 3 of 80 slices shown (1 of 4)]
[im 1/80]
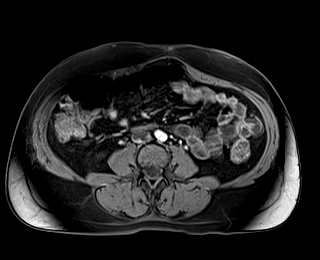
[im 40/80]
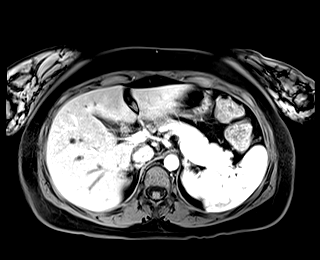
[im 80/80]
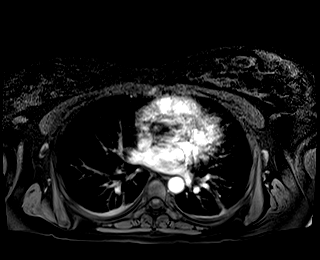

[Series 19: T1 dynamic fat-sat · axial · 3.0mm · 1.19mm/px · z∈[+2,+239]mm · 3 of 80 slices shown (2 of 5)]
[im 1/80]
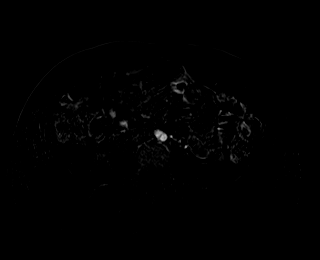
[im 40/80]
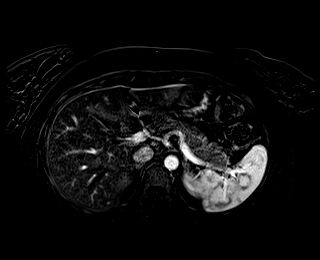
[im 80/80]
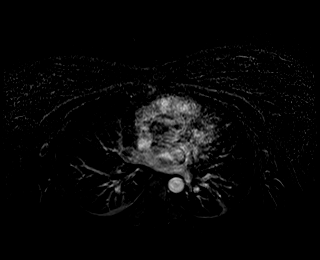

[Series 20: T1 dynamic fat-sat post-contrast · axial · 3.0mm · 1.19mm/px · z∈[+2,+239]mm · 3 of 80 slices shown (2 of 4)]
[im 1/80]
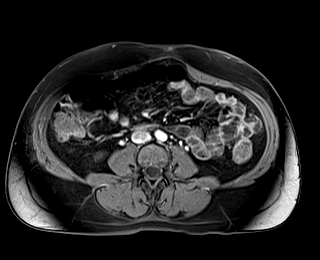
[im 40/80]
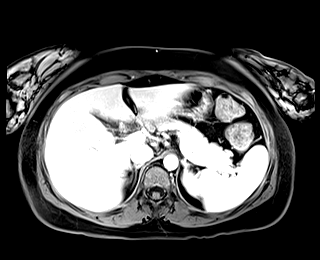
[im 80/80]
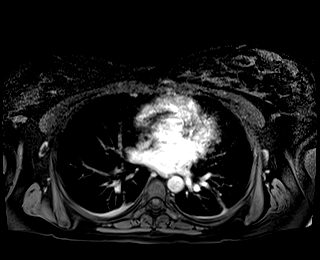

[Series 21: T1 dynamic fat-sat · axial · 3.0mm · 1.19mm/px · z∈[+2,+239]mm · 3 of 80 slices shown (3 of 5)]
[im 1/80]
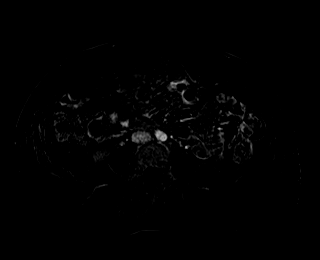
[im 40/80]
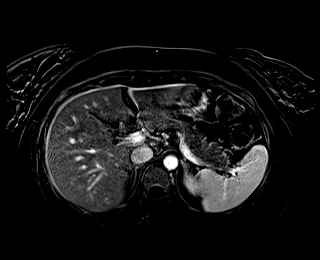
[im 80/80]
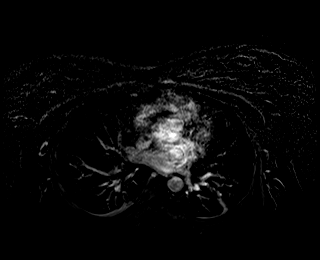

[Series 22: T1 dynamic fat-sat post-contrast · axial · 3.0mm · 1.19mm/px · z∈[+2,+239]mm · 3 of 80 slices shown (3 of 4)]
[im 1/80]
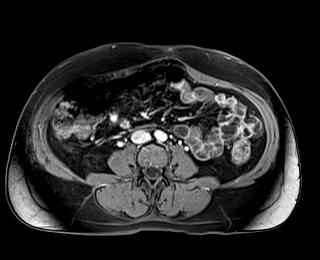
[im 40/80]
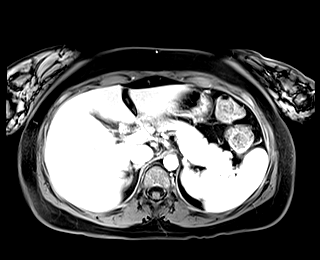
[im 80/80]
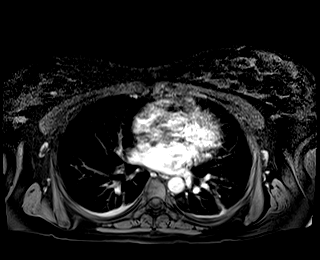

[Series 23: T1 dynamic fat-sat · axial · 3.0mm · 1.19mm/px · z∈[+2,+239]mm · 3 of 80 slices shown (4 of 5)]
[im 1/80]
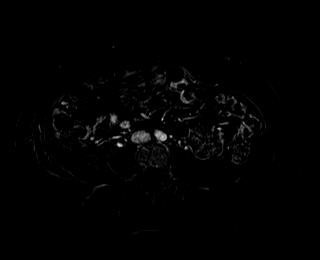
[im 40/80]
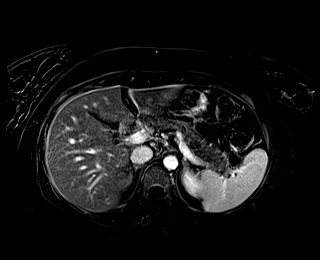
[im 80/80]
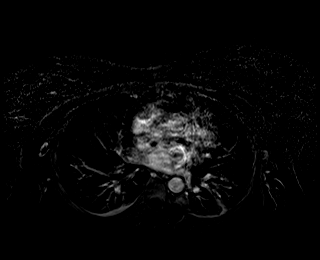

[Series 24: T1 dynamic post-contrast · coronal · 3.0mm · 1.31mm/px · 3 of 72 slices shown]
[im 1/72]
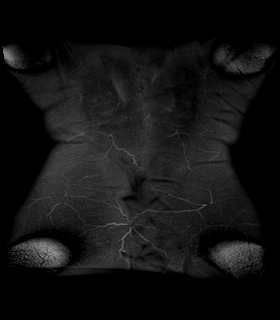
[im 36/72]
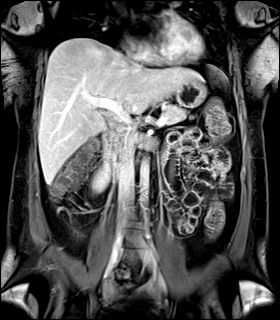
[im 72/72]
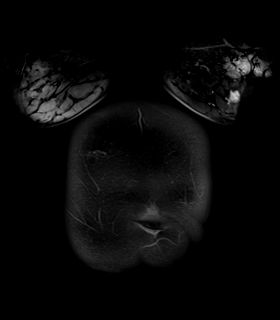

[Series 25: T1 dynamic fat-sat post-contrast · axial · 3.0mm · 1.19mm/px · z∈[+2,+239]mm · 3 of 80 slices shown (4 of 4)]
[im 1/80]
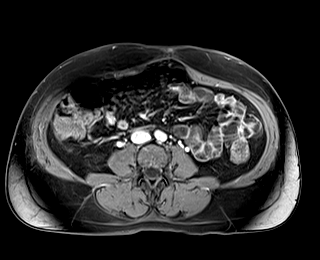
[im 40/80]
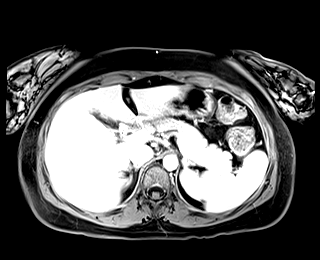
[im 80/80]
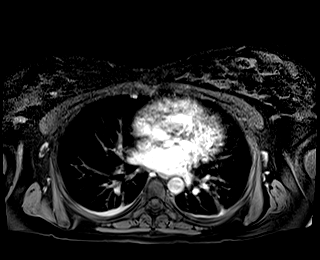

[Series 26: T1 dynamic fat-sat · axial · 3.0mm · 1.19mm/px · z∈[+2,+239]mm · 3 of 80 slices shown (5 of 5)]
[im 1/80]
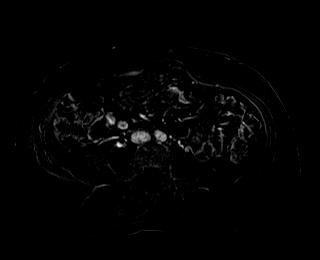
[im 40/80]
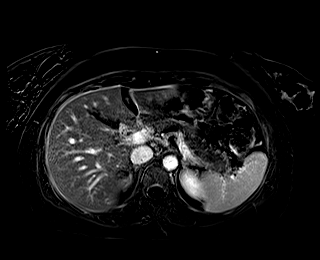
[im 80/80]
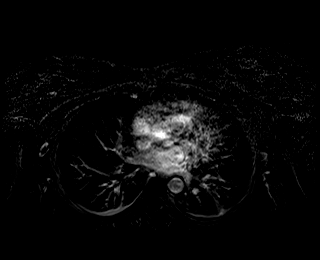

[Series 1015: out of phase · axial · 6.0mm · 0.74mm/px · 1 of 36 slices shown]
[im 1/36]
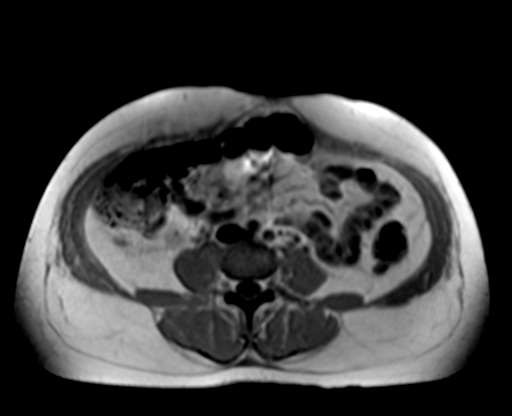

[Series 1022: out phase · axial · 6.0mm · 0.74mm/px · 1 of 36 slices shown]
[im 1/36]
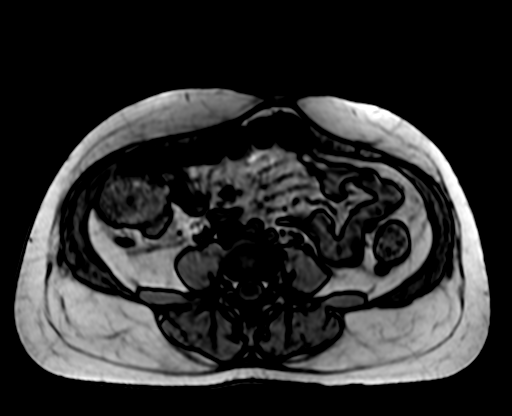

[Series 1052: h-f · axial · 0.5mm · 0.43mm/px · 1 of 3 slices shown]
[im 1/3]
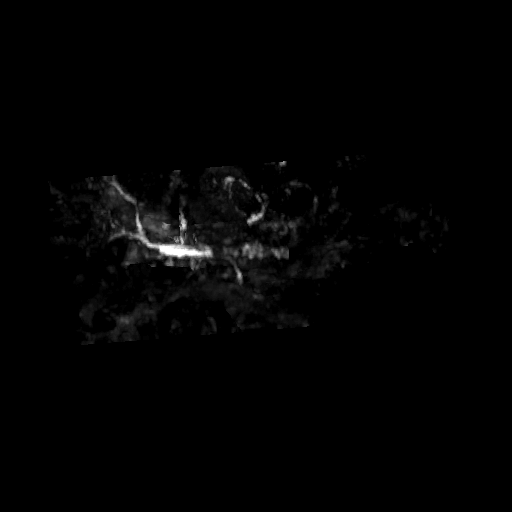

[45 of 48 positions shown; findings below may reference images not displayed]

FINDINGS: Lower chest: Mild dependent bibasilar atelectasis.

Hepatobiliary: No hepatic masses identified. A tiny sub-cm cyst is
seen in segment 3 of the left lobe. Postop changes are seen from
recent cholecystectomy. No evidence of abnormal fluid collections.
No evidence of biliary ductal dilatation, with common bile duct
measuring 3 mm. No evidence of choledocholithiasis.

Pancreas: No mass or inflammatory changes. No evidence of pancreatic
ductal dilatation.

Spleen:  Within normal limits in size and appearance.

Adrenals/Urinary Tract: No masses identified. No evidence of
hydronephrosis.

Stomach/Bowel: Visualized portion unremarkable.

Vascular/Lymphatic: No pathologically enlarged lymph nodes
identified. No abdominal aortic aneurysm.

Other:  None.

Musculoskeletal:  No suspicious bone lesions identified.
IMPRESSION: Expected postop changes from recent cholecystectomy. No evidence of
postop fluid collections, biliary ductal dilatation, or other
complication.

## 2022-08-15 ENCOUNTER — Ambulatory Visit
Admission: EM | Admit: 2022-08-15 | Discharge: 2022-08-15 | Disposition: A | Discharge status: Home / Self Care | Payer: BC Managed Care – PPO | Attending: Physician Assistant | Admitting: Physician Assistant

## 2022-08-15 DIAGNOSIS — J069 Acute upper respiratory infection, unspecified: Secondary | ICD-10-CM

## 2022-08-15 DIAGNOSIS — J029 Acute pharyngitis, unspecified: Secondary | ICD-10-CM | POA: Insufficient documentation

## 2022-08-15 DIAGNOSIS — Z1152 Encounter for screening for COVID-19: Secondary | ICD-10-CM | POA: Insufficient documentation

## 2022-08-15 LAB — POCT RAPID STREP A (OFFICE): Rapid Strep A Screen: NEGATIVE

## 2022-08-15 LAB — RESP PANEL BY RT-PCR (FLU A&B, COVID) ARPGX2
Influenza A by PCR: NEGATIVE
Influenza B by PCR: NEGATIVE
SARS Coronavirus 2 by RT PCR: POSITIVE — AB

## 2022-08-15 NOTE — ED Triage Notes (Signed)
Pt c/o headache, ear ache, body aches, feeling like swallowing glass onset ~ last night.

## 2022-08-15 NOTE — ED Provider Notes (Signed)
[facility]    CSN: [id number] Arrival date & time: 08/15/22  0810      History   Chief Complaint Chief Complaint  Patient presents with   Sore Throat    HPI Tina Wilson is a 36 y.o. female.   Patient here today for evaluation of sore throat that started yesterday.  She reports that she has had some congestion, ear pain, fever, body aches. Tmax 101 F. She has not had significant cough.  She does note that her boss tested positive for COVID at the beginning of the week.  She has not had any vomiting or diarrhea.  She states she took NyQuil last night with mild relief.  The history is provided by the patient.    Past Medical History:  Diagnosis Date   Allergy    Anxiety    Asthma    Depression    Dysplastic nevus 12/20/2021   right mid back paraspinal - moderate   Herpes genitalis in women 2010   Migraine    Thrombocytopenic disorder [facility])     Patient Active Problem List   Diagnosis Date Noted   Right upper quadrant abdominal pain    Choledocholithiasis 09/30/2020   Increased liver enzymes    Term pregnancy 12/01/2019   Rhinosinusitis 06/01/2019   Thrombocytopenia ([place]) 08/04/2018   Elevated LDL cholesterol level 08/04/2018   Sore throat 07/20/2018   Cough 07/20/2018   Migraine without aura and without status migrainosus, not intractable 06/23/2018   Post-dates pregnancy 10/30/2016   Healthcare maintenance 01/11/2013    Past Surgical History:  Procedure Laterality Date   CESAREAN SECTION N/A 12/01/2019   Procedure: CESAREAN SECTION;  Surgeon: Everlene Farrier, MD;  Location: [facility];  Service: Obstetrics;  Laterality: N/A;   ENDOSCOPIC RETROGRADE CHOLANGIOPANCREATOGRAPHY (ERCP) WITH PROPOFOL N/A 09/30/2020   Procedure: ENDOSCOPIC RETROGRADE CHOLANGIOPANCREATOGRAPHY (ERCP) WITH PROPOFOL;  Surgeon: Lucilla Lame, MD;  Location: [facility] ENDOSCOPY;  Service: Endoscopy;  Laterality: N/A;   NO PAST SURGERIES      OB History     Gravida  2    Para  2   Term  2   Preterm      AB      Living  2      SAB      IAB      Ectopic      Multiple  0   Live Births  2            Home Medications    Prior to Admission medications   Medication Sig Start Date End Date Taking? Authorizing Provider  amoxicillin-clavulanate (AUGMENTIN) 875-125 MG tablet Take 1 tablet by mouth every 12 (twelve) hours. 04/19/22   Francene Finders, PA-C  dextromethorphan-guaiFENesin [facility] DM) 30-600 MG 12hr tablet Take 1 tablet by mouth 2 (two) times daily as needed for cough. Patient not taking: Reported on 09/18/2021 07/25/21   Lorrene Reid, PA-C  FLUoxetine (PROZAC) 20 MG capsule TAKE 1 CAPSULE BY MOUTH EVERY DAY 03/21/22   Abonza, Maritza, PA-C  fluticasone (FLONASE) 50 MCG/ACT nasal spray Place 1 spray into both nostrils daily for 14 days. 04/19/22 05/03/22  Francene Finders, PA-C  predniSONE (DELTASONE) 20 MG tablet Take 2 tablets by mouth x 2 days, 1 tablets x 2 days, 0.5 tablet x 2 days Patient not taking: Reported on 09/18/2021 07/25/21   Lorrene Reid, PA-C    Family History Family History  Problem Relation Age of Onset   Alcohol abuse Maternal Grandmother  Breast cancer Paternal Grandmother    Alcohol abuse Paternal Grandfather    Arthritis Mother    Depression Mother    Hyperlipidemia Father    Hypertension Father    Alcohol abuse Paternal Aunt    Heart attack Maternal Grandfather    Stroke Maternal Grandfather    Cancer Neg Hx    COPD Neg Hx    Diabetes Neg Hx    Drug abuse Neg Hx    Early death Neg Hx    Heart disease Neg Hx    Kidney disease Neg Hx     Social History Social History   Tobacco Use   Smoking status: Never   Smokeless tobacco: Never  Vaping Use   Vaping Use: Never used  Substance Use Topics   Alcohol use: Yes    Alcohol/week: 14.0 standard drinks of alcohol    Types: 10 Glasses of wine, 4 Standard drinks or equivalent per week   Drug use: No    Comment: valtrex     Allergies   Patient  has no known allergies.   Review of Systems Review of Systems  Constitutional:  Positive for chills and fever.  HENT:  Positive for congestion, ear pain and sore throat.   Eyes:  Negative for discharge and redness.  Respiratory:  Negative for cough, shortness of breath and wheezing.   Gastrointestinal:  Negative for abdominal pain, diarrhea, nausea and vomiting.  Musculoskeletal:  Positive for myalgias.     Physical Exam Triage Vital Signs ED Triage Vitals  Enc Vitals Group     BP      Pulse      Resp      Temp      Temp src      SpO2      Weight      Height      Head Circumference      Peak Flow      Pain Score      Pain Loc      Pain Edu?      Excl. in [place]?    No data found.  Updated Vital Signs BP 124/87 (BP Location: Right Arm)   Pulse 100   Temp 98.5 F (36.9 C) (Oral)   Resp 16   SpO2 98%     Physical Exam Vitals and nursing note reviewed.  Constitutional:      General: She is not in acute distress.    Appearance: Normal appearance. She is not ill-appearing.  HENT:     Head: Normocephalic and atraumatic.     Right Ear: Tympanic membrane normal.     Left Ear: Tympanic membrane normal.     Nose: Congestion present.     Mouth/Throat:     Mouth: Mucous membranes are moist.     Pharynx: No oropharyngeal exudate or posterior oropharyngeal erythema.  Eyes:     Conjunctiva/sclera: Conjunctivae normal.  Cardiovascular:     Rate and Rhythm: Normal rate and regular rhythm.     Heart sounds: Normal heart sounds. No murmur heard. Pulmonary:     Effort: Pulmonary effort is normal. No respiratory distress.     Breath sounds: Normal breath sounds. No wheezing, rhonchi or rales.  Skin:    General: Skin is warm and dry.  Neurological:     Mental Status: She is alert.  Psychiatric:        Mood and Affect: Mood normal.        Thought Content: Thought content normal.  UC Treatments / Results  Labs (all labs ordered are listed, but only abnormal  results are displayed) Labs Reviewed  RESP PANEL BY RT-PCR (FLU A&B, COVID) [id number]  CULTURE, GROUP A STREP [facility])  POCT RAPID STREP A (OFFICE)    EKG   Radiology No results found.  Procedures Procedures (including critical care time)  Medications Ordered in UC Medications - No data to display  Initial Impression / Assessment and Plan / UC Course  I have reviewed the triage vital signs and the nursing notes.  Pertinent labs & imaging results that were available during my care of the patient were reviewed by me and considered in my medical decision making (see chart for details).    Strep test negative in office.  Will order throat culture.  Recommend viral screening for COVID and flu, will await results further recommendation.  Encouraged symptomatic treatment with increase fluids in the meantime.  Recommend follow-up with any further concerns.  Final Clinical Impressions(s) / UC Diagnoses   Final diagnoses:  Acute upper respiratory infection  Acute pharyngitis, unspecified etiology  Encounter for screening for COVID-19   Discharge Instructions   None    ED Prescriptions   None    PDMP not reviewed this encounter.   Francene Finders, PA-C 08/15/22 [phone]

## 2022-08-18 LAB — CULTURE, GROUP A STREP (THRC)

## 2022-10-31 DIAGNOSIS — Z124 Encounter for screening for malignant neoplasm of cervix: Secondary | ICD-10-CM | POA: Diagnosis not present

## 2022-10-31 DIAGNOSIS — Z6831 Body mass index (BMI) 31.0-31.9, adult: Secondary | ICD-10-CM | POA: Diagnosis not present

## 2022-10-31 DIAGNOSIS — Z01419 Encounter for gynecological examination (general) (routine) without abnormal findings: Secondary | ICD-10-CM | POA: Diagnosis not present

## 2022-11-25 DIAGNOSIS — Z1231 Encounter for screening mammogram for malignant neoplasm of breast: Secondary | ICD-10-CM | POA: Diagnosis not present

## 2022-12-10 DIAGNOSIS — E782 Mixed hyperlipidemia: Secondary | ICD-10-CM | POA: Diagnosis not present

## 2022-12-10 DIAGNOSIS — Z1331 Encounter for screening for depression: Secondary | ICD-10-CM | POA: Diagnosis not present

## 2022-12-10 DIAGNOSIS — D696 Thrombocytopenia, unspecified: Secondary | ICD-10-CM | POA: Diagnosis not present

## 2022-12-10 DIAGNOSIS — Z6831 Body mass index (BMI) 31.0-31.9, adult: Secondary | ICD-10-CM | POA: Diagnosis not present

## 2022-12-10 DIAGNOSIS — E8889 Other specified metabolic disorders: Secondary | ICD-10-CM | POA: Diagnosis not present

## 2022-12-10 DIAGNOSIS — R5383 Other fatigue: Secondary | ICD-10-CM | POA: Diagnosis not present

## 2022-12-10 DIAGNOSIS — G43909 Migraine, unspecified, not intractable, without status migrainosus: Secondary | ICD-10-CM | POA: Diagnosis not present

## 2022-12-10 DIAGNOSIS — Z131 Encounter for screening for diabetes mellitus: Secondary | ICD-10-CM | POA: Diagnosis not present

## 2022-12-24 DIAGNOSIS — R5383 Other fatigue: Secondary | ICD-10-CM | POA: Diagnosis not present

## 2022-12-24 DIAGNOSIS — D696 Thrombocytopenia, unspecified: Secondary | ICD-10-CM | POA: Diagnosis not present

## 2022-12-24 DIAGNOSIS — G43909 Migraine, unspecified, not intractable, without status migrainosus: Secondary | ICD-10-CM | POA: Diagnosis not present

## 2022-12-24 DIAGNOSIS — E782 Mixed hyperlipidemia: Secondary | ICD-10-CM | POA: Diagnosis not present

## 2022-12-25 ENCOUNTER — Ambulatory Visit: Payer: BC Managed Care – PPO | Admitting: Dermatology

## 2022-12-25 VITALS — BP 125/77 | HR 79

## 2022-12-25 DIAGNOSIS — D225 Melanocytic nevi of trunk: Secondary | ICD-10-CM

## 2022-12-25 DIAGNOSIS — Z86018 Personal history of other benign neoplasm: Secondary | ICD-10-CM

## 2022-12-25 DIAGNOSIS — L814 Other melanin hyperpigmentation: Secondary | ICD-10-CM

## 2022-12-25 DIAGNOSIS — D229 Melanocytic nevi, unspecified: Secondary | ICD-10-CM

## 2022-12-25 DIAGNOSIS — D492 Neoplasm of unspecified behavior of bone, soft tissue, and skin: Secondary | ICD-10-CM

## 2022-12-25 DIAGNOSIS — Z1283 Encounter for screening for malignant neoplasm of skin: Secondary | ICD-10-CM

## 2022-12-25 DIAGNOSIS — L821 Other seborrheic keratosis: Secondary | ICD-10-CM

## 2022-12-25 DIAGNOSIS — L578 Other skin changes due to chronic exposure to nonionizing radiation: Secondary | ICD-10-CM | POA: Diagnosis not present

## 2022-12-25 NOTE — Progress Notes (Signed)
   Follow-Up Visit   Subjective  Tina Wilson is a 37 y.o. female who presents for the following: Skin Cancer Screening and Full Body Skin Exam  The patient presents for Total-Body Skin Exam (TBSE) for skin cancer screening and mole check. The patient has spots, moles and lesions to be evaluated, some may be new or changing and the patient has concerns that these could be cancer.    The following portions of the chart were reviewed this encounter and updated as appropriate: medications, allergies, medical history  Review of Systems:  No other skin or systemic complaints except as noted in HPI or Assessment and Plan.  Objective  Well appearing patient in no apparent distress; mood and affect are within normal limits.  A full examination was performed including scalp, head, eyes, ears, nose, lips, neck, chest, axillae, abdomen, back, buttocks, bilateral upper extremities, bilateral lower extremities, hands, feet, fingers, toes, fingernails, and toenails. All findings within normal limits unless otherwise noted below.   Relevant physical exam findings are noted in the Assessment and Plan.  right mid back paraspinal 0.2 cm brown macule        Assessment & Plan   LENTIGINES, SEBORRHEIC KERATOSES, HEMANGIOMAS - Benign normal skin lesions - Benign-appearing - Call for any changes  MELANOCYTIC NEVI - Tan-brown and/or pink-flesh-colored symmetric macules and papules - Benign appearing on exam today - Observation - Call clinic for new or changing moles - Recommend daily use of broad spectrum spf 30+ sunscreen to sun-exposed areas.   ACTINIC DAMAGE - Chronic condition, secondary to cumulative UV/sun exposure - diffuse scaly erythematous macules with underlying dyspigmentation - Recommend daily broad spectrum sunscreen SPF 30+ to sun-exposed areas, reapply every 2 hours as needed.  - Staying in the shade or wearing long sleeves, sun glasses (UVA+UVB protection) and wide  brim hats (4-inch brim around the entire circumference of the hat) are also recommended for sun protection.  - Call for new or changing lesions.  HISTORY OF DYSPLASTIC NEVUS Right mid back paraspinal-mod atypia  No evidence of recurrence today Recommend regular full body skin exams Recommend daily broad spectrum sunscreen SPF 30+ to sun-exposed areas, reapply every 2 hours as needed.  Call if any new or changing lesions are noted between office visits    SKIN CANCER SCREENING PERFORMED TODAY.  Neoplasm of skin right mid back paraspinal  Epidermal / dermal shaving  Lesion diameter (cm):  0.2 Informed consent: discussed and consent obtained   Patient was prepped and draped in usual sterile fashion: area prepped with alcohol. Anesthesia: the lesion was anesthetized in a standard fashion   Anesthetic:  1% lidocaine w/ epinephrine 1-100,000 buffered w/ 8.4% NaHCO3 Instrument used: flexible razor blade   Hemostasis achieved with: pressure, aluminum chloride and electrodesiccation   Outcome: patient tolerated procedure well   Post-procedure details: wound care instructions given   Post-procedure details comment:  Ointment and small bandage applied  Specimen 1 - Surgical pathology Differential Diagnosis: recurrent dysplastic nevus   Check Margins: No   Return in about 1 year (around 12/25/2023) for TBSE, hx of Dysplastic nevus .  IAngelique Holm, CMA, am acting as scribe for Armida Sans, MD .   Documentation: I have reviewed the above documentation for accuracy and completeness, and I agree with the above.  Armida Sans, MD

## 2022-12-25 NOTE — Patient Instructions (Addendum)
Wound Care Instructions  Cleanse wound gently with soap and water once a day then pat dry with clean gauze. Apply a thin coat of Petrolatum (petroleum jelly, "Vaseline") over the wound (unless you have an allergy to this). We recommend that you use a new, sterile tube of Vaseline. Do not pick or remove scabs. Do not remove the yellow or white "healing tissue" from the base of the wound.  Cover the wound with fresh, clean, nonstick gauze and secure with paper tape. You may use Band-Aids in place of gauze and tape if the wound is small enough, but would recommend trimming much of the tape off as there is often too much. Sometimes Band-Aids can irritate the skin.  You should call the office for your biopsy report after 1 week if you have not already been contacted.  If you experience any problems, such as abnormal amounts of bleeding, swelling, significant bruising, significant pain, or evidence of infection, please call the office immediately.  FOR ADULT SURGERY PATIENTS: If you need something for pain relief you may take 1 extra strength Tylenol (acetaminophen) AND 2 Ibuprofen (200mg each) together every 4 hours as needed for pain. (do not take these if you are allergic to them or if you have a reason you should not take them.) Typically, you may only need pain medication for 1 to 3 days.     Due to recent changes in healthcare laws, you may see results of your pathology and/or laboratory studies on MyChart before the doctors have had a chance to review them. We understand that in some cases there may be results that are confusing or concerning to you. Please understand that not all results are received at the same time and often the doctors may need to interpret multiple results in order to provide you with the best plan of care or course of treatment. Therefore, we ask that you please give us 2 business days to thoroughly review all your results before contacting the office for clarification. Should  we see a critical lab result, you will be contacted sooner.   If You Need Anything After Your Visit  If you have any questions or concerns for your doctor, please call our main line at [phone] and press option 4 to reach your doctor's medical assistant. If no one answers, please leave a voicemail as directed and we will return your call as soon as possible. Messages left after 4 pm will be answered the following business day.   You may also send us a message via MyChart. We typically respond to MyChart messages within 1-2 business days.  For prescription refills, please ask your pharmacy to contact our office. Our fax number is [phone].  If you have an urgent issue when the clinic is closed that cannot wait until the next business day, you can page your doctor at the number below.    Please note that while we do our best to be available for urgent issues outside of office hours, we are not available 24/7.   If you have an urgent issue and are unable to reach us, you may choose to seek medical care at your doctor's office, retail clinic, urgent care center, or emergency room.  If you have a medical emergency, please immediately call 911 or go to the emergency department.  Pager Numbers  - Dr. Kowalski: [phone]  - Dr. Moye: [phone]  - Dr. Stewart: [phone]  In the event of inclement weather, please call our main line at   [phone] for an update on the status of any delays or closures.  Dermatology Medication Tips: Please keep the boxes that topical medications come in in order to help keep track of the instructions about where and how to use these. Pharmacies typically print the medication instructions only on the boxes and not directly on the medication tubes.   If your medication is too expensive, please contact our office at [phone] option 4 or send us a message through MyChart.   We are unable to tell what your co-pay for medications will be in  advance as this is different depending on your insurance coverage. However, we may be able to find a substitute medication at lower cost or fill out paperwork to get insurance to cover a needed medication.   If a prior authorization is required to get your medication covered by your insurance company, please allow us 1-2 business days to complete this process.  Drug prices often vary depending on where the prescription is filled and some pharmacies may offer cheaper prices.  The website www.goodrx.com contains coupons for medications through different pharmacies. The prices here do not account for what the cost may be with help from insurance (it may be cheaper with your insurance), but the website can give you the price if you did not use any insurance.  - You can print the associated coupon and take it with your prescription to the pharmacy.  - You may also stop by our office during regular business hours and pick up a GoodRx coupon card.  - If you need your prescription sent electronically to a different pharmacy, notify our office through [place] MyChart or by phone at [phone] option 4.     Si Usted Necesita Algo Despus de Su Visita  Tambin puede enviarnos un mensaje a travs de MyChart. Por lo general respondemos a los mensajes de MyChart en el transcurso de 1 a 2 das hbiles.  Para renovar recetas, por favor pida a su farmacia que se ponga en contacto con nuestra oficina. Nuestro nmero de fax es el [phone].  Si tiene un asunto urgente cuando la clnica est cerrada y que no puede esperar hasta el siguiente da hbil, puede llamar/localizar a su doctor(a) al nmero que aparece a continuacin.   Por favor, tenga en cuenta que aunque hacemos todo lo posible para estar disponibles para asuntos urgentes fuera del horario de oficina, no estamos disponibles las 24 horas del da, los 7 das de la semana.   Si tiene un problema urgente y no puede comunicarse con nosotros, puede  optar por buscar atencin mdica  en el consultorio de su doctor(a), en una clnica privada, en un centro de atencin urgente o en una sala de emergencias.  Si tiene una emergencia mdica, por favor llame inmediatamente al 911 o vaya a la sala de emergencias.  Nmeros de bper  - Dr. Kowalski: [phone]  - Dra. Moye: [phone]  - Dra. Stewart: [phone]  En caso de inclemencias del tiempo, por favor llame a nuestra lnea principal al [phone] para una actualizacin sobre el estado de cualquier retraso o cierre.  Consejos para la medicacin en dermatologa: Por favor, guarde las cajas en las que vienen los medicamentos de uso tpico para ayudarle a seguir las instrucciones sobre dnde y cmo usarlos. Las farmacias generalmente imprimen las instrucciones del medicamento slo en las cajas y no directamente en los tubos del medicamento.   Si su medicamento es muy caro, por favor, pngase en contacto con   nuestra oficina llamando al [phone] y presione la opcin 4 o envenos un mensaje a travs de MyChart.   No podemos decirle cul ser su copago por los medicamentos por adelantado ya que esto es diferente dependiendo de la cobertura de su seguro. Sin embargo, es posible que podamos encontrar un medicamento sustituto a menor costo o llenar un formulario para que el seguro cubra el medicamento que se considera necesario.   Si se requiere una autorizacin previa para que su compaa de seguros cubra su medicamento, por favor permtanos de 1 a 2 das hbiles para completar este proceso.  Los precios de los medicamentos varan con frecuencia dependiendo del lugar de dnde se surte la receta y alguna farmacias pueden ofrecer precios ms baratos.  El sitio web www.goodrx.com tiene cupones para medicamentos de diferentes farmacias. Los precios aqu no tienen en cuenta lo que podra costar con la ayuda del seguro (puede ser ms barato con su seguro), pero el sitio web puede darle el  precio si no utiliz ningn seguro.  - Puede imprimir el cupn correspondiente y llevarlo con su receta a la farmacia.  - Tambin puede pasar por nuestra oficina durante el horario de atencin regular y recoger una tarjeta de cupones de GoodRx.  - Si necesita que su receta se enve electrnicamente a una farmacia diferente, informe a nuestra oficina a travs de MyChart de [place] o por telfono llamando al [phone] y presione la opcin 4.  

## 2022-12-30 ENCOUNTER — Telehealth: Payer: Self-pay

## 2022-12-30 NOTE — Telephone Encounter (Signed)
Left message for patient to call for pathology results/hd

## 2022-12-30 NOTE — Telephone Encounter (Signed)
-----   Message from Deirdre Evener, MD sent at 12/29/2022  6:29 PM EDT ----- Diagnosis Skin , right mid back paraspinal RECURRENT MELANOCYTIC NEVUS, CLOSE TO MARGIN  Recurrent dysplastic nevus Recheck next visit

## 2022-12-31 ENCOUNTER — Telehealth: Payer: Self-pay

## 2022-12-31 NOTE — Telephone Encounter (Signed)
Called pt discussed biopsy results  

## 2022-12-31 NOTE — Telephone Encounter (Signed)
-----   Message from David C Kowalski, MD sent at 12/29/2022  6:29 PM EDT ----- Diagnosis Skin , right mid back paraspinal RECURRENT MELANOCYTIC NEVUS, CLOSE TO MARGIN  Recurrent dysplastic nevus Recheck next visit 

## 2023-01-08 DIAGNOSIS — E782 Mixed hyperlipidemia: Secondary | ICD-10-CM | POA: Diagnosis not present

## 2023-01-08 DIAGNOSIS — D696 Thrombocytopenia, unspecified: Secondary | ICD-10-CM | POA: Diagnosis not present

## 2023-01-08 DIAGNOSIS — E559 Vitamin D deficiency, unspecified: Secondary | ICD-10-CM | POA: Diagnosis not present

## 2023-01-08 DIAGNOSIS — G43909 Migraine, unspecified, not intractable, without status migrainosus: Secondary | ICD-10-CM | POA: Diagnosis not present

## 2023-01-12 ENCOUNTER — Ambulatory Visit
Admission: EM | Admit: 2023-01-12 | Discharge: 2023-01-12 | Disposition: A | Discharge status: Home / Self Care | Payer: BC Managed Care – PPO | Attending: Internal Medicine | Admitting: Internal Medicine

## 2023-01-12 ENCOUNTER — Encounter: Payer: Self-pay | Admitting: Dermatology

## 2023-01-12 DIAGNOSIS — J029 Acute pharyngitis, unspecified: Secondary | ICD-10-CM | POA: Diagnosis not present

## 2023-01-12 LAB — POCT RAPID STREP A (OFFICE): Rapid Strep A Screen: NEGATIVE

## 2023-01-12 MED ORDER — GUAIFENESIN ER 600 MG PO TB12: 600.0000 mg | ORAL_TABLET | Freq: Two times a day (BID) | ORAL | 0 refills | Status: AC

## 2023-01-12 NOTE — Discharge Instructions (Addendum)
Strep test was negative Will send the throat samples off for cultures If samples are positive we will call you with recommendations Warm salt water gargles or Chloraseptic throat spray Tylenol or ibuprofen as needed for pain Please return to urgent care if you have worsening symptoms.

## 2023-01-12 NOTE — ED Triage Notes (Signed)
Pt presents today with a sore throat that she started started Friday night and some ear pain that's been going on for a couple of weeks.

## 2023-01-12 NOTE — ED Provider Notes (Signed)
[name]    CSN: [id number] Arrival date & time: 01/12/23  0807      History   Chief Complaint Chief Complaint  Patient presents with   Sore Throat   Otalgia    HPI Tina Wilson is a 37 y.o. female comes to urgent care with 1 week history of bilateral ear pain.  Patient endorses having a moderate, sharp and throbbing ear pain from 1 to 2 weeks duration.  Over the past couple of days patient started experiencing sore throat and pain on swallowing.  She denies any nasal congestion or nasal discharge.  No fever or chills.  No nausea or vomiting.  No shortness of breath, cough or wheezing.  Patient has children at home who having some sniffles and coughing.  She denies any generalized body aches.  No known relieving factors.  She has been taking over-the-counter medication with no improvement in her symptoms.Marland Kitchen   HPI  Past Medical History:  Diagnosis Date   Allergy    Anxiety    Asthma    Depression    Dysplastic nevus 12/20/2021   right mid back paraspinal - moderate - recurrent 12/25/2022   Herpes genitalis in women 2010   Migraine    Thrombocytopenic disorder [facility])     Patient Active Problem List   Diagnosis Date Noted   Right upper quadrant abdominal pain    Choledocholithiasis 09/30/2020   Increased liver enzymes    Term pregnancy 12/01/2019   Rhinosinusitis 06/01/2019   Thrombocytopenia (HCC) 08/04/2018   Elevated LDL cholesterol level 08/04/2018   Sore throat 07/20/2018   Cough 07/20/2018   Migraine without aura and without status migrainosus, not intractable 06/23/2018   Post-dates pregnancy 10/30/2016   Healthcare maintenance 01/11/2013    Past Surgical History:  Procedure Laterality Date   CESAREAN SECTION N/A 12/01/2019   Procedure: CESAREAN SECTION;  Surgeon: Harold Hedge, MD;  Location: [facility] LD ORS;  Service: Obstetrics;  Laterality: N/A;   ENDOSCOPIC RETROGRADE CHOLANGIOPANCREATOGRAPHY (ERCP) WITH PROPOFOL N/A 09/30/2020    Procedure: ENDOSCOPIC RETROGRADE CHOLANGIOPANCREATOGRAPHY (ERCP) WITH PROPOFOL;  Surgeon: Midge Minium, MD;  Location: [facility] ENDOSCOPY;  Service: Endoscopy;  Laterality: N/A;   NO PAST SURGERIES      OB History     Gravida  2   Para  2   Term  2   Preterm      AB      Living  2      SAB      IAB      Ectopic      Multiple  0   Live Births  2            Home Medications    Prior to Admission medications   Medication Sig Start Date End Date Taking? Authorizing Provider  guaiFENesin (MUCINEX) 600 MG 12 hr tablet Take 1 tablet (600 mg total) by mouth 2 (two) times daily for 10 days. 01/12/23 01/22/23 Yes Polette Nofsinger, Britta Mccreedy, MD  amoxicillin-clavulanate (AUGMENTIN) 875-125 MG tablet Take 1 tablet by mouth every 12 (twelve) hours. 04/19/22   Tomi Bamberger, PA-C  FLUoxetine (PROZAC) 20 MG capsule TAKE 1 CAPSULE BY MOUTH EVERY DAY 03/21/22   Abonza, Maritza, PA-C  fluticasone (FLONASE) 50 MCG/ACT nasal spray Place 1 spray into both nostrils daily for 14 days. 04/19/22 05/03/22  Tomi Bamberger, PA-C  predniSONE (DELTASONE) 20 MG tablet Take 2 tablets by mouth x 2 days, 1 tablets x 2 days, 0.5 tablet x 2  days Patient not taking: Reported on 09/18/2021 07/25/21   Mayer Masker, PA-C    Family History Family History  Problem Relation Age of Onset   Alcohol abuse Maternal Grandmother    Breast cancer Paternal Grandmother    Alcohol abuse Paternal Grandfather    Arthritis Mother    Depression Mother    Hyperlipidemia Father    Hypertension Father    Alcohol abuse Paternal Aunt    Heart attack Maternal Grandfather    Stroke Maternal Grandfather    Cancer Neg Hx    COPD Neg Hx    Diabetes Neg Hx    Drug abuse Neg Hx    Early death Neg Hx    Heart disease Neg Hx    Kidney disease Neg Hx     Social History Social History   Tobacco Use   Smoking status: Never   Smokeless tobacco: Never  Vaping Use   Vaping Use: Never used  Substance Use Topics   Alcohol use: Yes     Alcohol/week: 14.0 standard drinks of alcohol    Types: 10 Glasses of wine, 4 Standard drinks or equivalent per week   Drug use: No    Comment: valtrex     Allergies   Patient has no known allergies.   Review of Systems Review of Systems As per HPI  Physical Exam Triage Vital Signs ED Triage Vitals  Enc Vitals Group     BP 01/12/23 0839 118/81     Pulse Rate 01/12/23 0839 91     Resp 01/12/23 0839 18     Temp 01/12/23 0839 98.5 F (36.9 C)     Temp src --      SpO2 01/12/23 0839 96 %     Weight --      Height --      Head Circumference --      Peak Flow --      Pain Score 01/12/23 0837 8     Pain Loc --      Pain Edu? --      Excl. in GC? --    No data found.  Updated Vital Signs BP 118/81   Pulse 91   Temp 98.5 F (36.9 C)   Resp 18   LMP 12/30/2022   SpO2 96%   Visual Acuity Right Eye Distance:   Left Eye Distance:   Bilateral Distance:    Right Eye Near:   Left Eye Near:    Bilateral Near:     Physical Exam Vitals and nursing note reviewed.  Constitutional:      General: She is not in acute distress.    Appearance: She is not ill-appearing.  HENT:     Ears:     Comments: Bilateral middle ear effusion with no erythema of the tympanic membranes.    Mouth/Throat:     Mouth: Mucous membranes are moist.     Pharynx: Uvula midline. Pharyngeal swelling present.     Tonsils: No tonsillar exudate.  Eyes:     Conjunctiva/sclera: Conjunctivae normal.  Cardiovascular:     Rate and Rhythm: Normal rate and regular rhythm.  Neurological:     Mental Status: She is alert.      UC Treatments / Results  Labs (all labs ordered are listed, but only abnormal results are displayed) Labs Reviewed  POCT RAPID STREP A (OFFICE)    EKG   Radiology No results found.  Procedures Procedures (including critical care time)  Medications Ordered in UC Medications -  No data to display  Initial Impression / Assessment and Plan / UC Course  I have  reviewed the triage vital signs and the nursing notes.  Pertinent labs & imaging results that were available during my care of the patient were reviewed by me and considered in my medical decision making (see chart for details).     1.  Acute viral pharyngitis: Point-of-care strep is negative No indication for COVID testing given the duration of his symptoms Warm salt water gargle advised. Tylenol/Motrin as needed for pain and/or fever Will send the throat samples for culture.  If the culture results are abnormal we will call you with recommendations. Return to urgent care if you have worsening symptoms. Final Clinical Impressions(s) / UC Diagnoses   Final diagnoses:  Acute viral pharyngitis     Discharge Instructions      Strep test was negative Will send the throat samples off for cultures If samples are positive we will call you with recommendations Warm salt water gargles or Chloraseptic throat spray Tylenol or ibuprofen as needed for pain Please return to urgent care if you have worsening symptoms.   ED Prescriptions     Medication Sig Dispense Auth. Provider   guaiFENesin (MUCINEX) 600 MG 12 hr tablet Take 1 tablet (600 mg total) by mouth 2 (two) times daily for 10 days. 20 tablet Ikaika Showers, Britta Mccreedy, MD      PDMP not reviewed this encounter.   Merrilee Jansky, MD 01/12/23 [phone]

## 2023-01-14 LAB — CULTURE, GROUP A STREP (THRC)

## 2023-01-15 ENCOUNTER — Telehealth (HOSPITAL_COMMUNITY): Payer: Self-pay | Admitting: Emergency Medicine

## 2023-01-15 MED ORDER — AMOXICILLIN 500 MG PO CAPS: 500.0000 mg | ORAL_CAPSULE | Freq: Two times a day (BID) | ORAL | 0 refills | Status: AC

## 2023-01-29 DIAGNOSIS — E782 Mixed hyperlipidemia: Secondary | ICD-10-CM | POA: Diagnosis not present

## 2023-01-29 DIAGNOSIS — D696 Thrombocytopenia, unspecified: Secondary | ICD-10-CM | POA: Diagnosis not present

## 2023-01-29 DIAGNOSIS — E559 Vitamin D deficiency, unspecified: Secondary | ICD-10-CM | POA: Diagnosis not present

## 2023-01-29 DIAGNOSIS — G43909 Migraine, unspecified, not intractable, without status migrainosus: Secondary | ICD-10-CM | POA: Diagnosis not present

## 2023-06-26 ENCOUNTER — Encounter: Payer: Self-pay | Admitting: Dermatology

## 2023-06-26 ENCOUNTER — Ambulatory Visit: Payer: BC Managed Care – PPO | Admitting: Dermatology

## 2023-06-26 DIAGNOSIS — L821 Other seborrheic keratosis: Secondary | ICD-10-CM

## 2023-06-26 NOTE — Progress Notes (Signed)
   Follow-Up Visit   Subjective  Tina Wilson is a 37 y.o. female who presents for the following: Patient c/o a mole on her right cheek that is growing and changing, pt would like this mole removed today.  The patient has spots, moles and lesions to be evaluated, some may be new or changing and the patient may have concern these could be cancer.   The following portions of the chart were reviewed this encounter and updated as appropriate: medications, allergies, medical history  Review of Systems:  No other skin or systemic complaints except as noted in HPI or Assessment and Plan.  Objective  Well appearing patient in no apparent distress; mood and affect are within normal limits.  A focused examination was performed of the following areas:face   Relevant exam findings are noted in the Assessment and Plan.       R cheek x 1 Stuck on waxy papule           Assessment & Plan   Seborrheic keratosis R cheek x 1  Frozen as test spot. Do not bill insurance  Destruction of lesion - R cheek x 1 Complexity: simple   Destruction method: cryotherapy   Informed consent: discussed and consent obtained   Timeout:  patient name, date of birth, surgical site, and procedure verified Lesion destroyed using liquid nitrogen: Yes   Region frozen until ice ball extended beyond lesion: Yes   Cryotherapy cycles:  2 (1 or 2) Outcome: patient tolerated procedure well with no complications   Post-procedure details: wound care instructions given      Return for as scheduled with Dr. Gwen Pounds for TBSE.  I, Ardis Rowan, CMA, am acting as scribe for Elie Goody, MD .   Documentation: I have reviewed the above documentation for accuracy and completeness, and I agree with the above.  Elie Goody, MD

## 2023-06-26 NOTE — Patient Instructions (Addendum)
Cryotherapy Aftercare  Wash gently with soap and water everyday.   Apply Vaseline and Band-Aid daily until healed.   Seborrheic Keratosis  What causes seborrheic keratoses? Seborrheic keratoses are harmless, common skin growths that first appear during adult life.  As time goes by, more growths appear.  Some people may develop a large number of them.  Seborrheic keratoses appear on both covered and uncovered body parts.  They are not caused by sunlight.  The tendency to develop seborrheic keratoses can be inherited.  They vary in color from skin-colored to gray, brown, or even black.  They can be either smooth or have a rough, warty surface.   Seborrheic keratoses are superficial and look as if they were stuck on the skin.  Under the microscope this type of keratosis looks like layers upon layers of skin.  That is why at times the top layer may seem to fall off, but the rest of the growth remains and re-grows.    Treatment Seborrheic keratoses do not need to be treated, but can easily be removed in the office.  Seborrheic keratoses often cause symptoms when they rub on clothing or jewelry.  Lesions can be in the way of shaving.  If they become inflamed, they can cause itching, soreness, or burning.  Removal of a seborrheic keratosis can be accomplished by freezing, burning, or surgery. If any spot bleeds, scabs, or grows rapidly, please return to have it checked, as these can be an indication of a skin cancer.  Due to recent changes in healthcare laws, you may see results of your pathology and/or laboratory studies on MyChart before the doctors have had a chance to review them. We understand that in some cases there may be results that are confusing or concerning to you. Please understand that not all results are received at the same time and often the doctors may need to interpret multiple results in order to provide you with the best plan of care or course of treatment. Therefore, we ask that you  please give [place] 2 business days to thoroughly review all your results before contacting the office for clarification. Should we see a critical lab result, you will be contacted sooner.   If You Need Anything After Your Visit  If you have any questions or concerns for your doctor, please call our main line at [phone] and press option 4 to reach your doctor's medical assistant. If no one answers, please leave a voicemail as directed and we will return your call as soon as possible. Messages left after 4 pm will be answered the following business day.   You may also send [place] a message via MyChart. We typically respond to MyChart messages within 1-2 business days.  For prescription refills, please ask your pharmacy to contact our office. Our fax number is [phone].  If you have an urgent issue when the clinic is closed that cannot wait until the next business day, you can page your doctor at the number below.    Please note that while we do our best to be available for urgent issues outside of office hours, we are not available 24/7.   If you have an urgent issue and are unable to reach [place], you may choose to seek medical care at your doctor's office, retail clinic, urgent care center, or emergency room.  If you have a medical emergency, please immediately call 911 or go to the emergency department.  Pager Numbers  - Dr. Gwen Pounds: [phone]  -  Dr. Roseanne Reno: [phone]  - Dr. Katrinka Blazing: [phone]   In the event of inclement weather, please call our main line at [phone] for an update on the status of any delays or closures.  Dermatology Medication Tips: Please keep the boxes that topical medications come in in order to help keep track of the instructions about where and how to use these. Pharmacies typically print the medication instructions only on the boxes and not directly on the medication tubes.   If your medication is too expensive, please contact our office at  [phone] option 4 or send [place] a message through MyChart.   We are unable to tell what your co-pay for medications will be in advance as this is different depending on your insurance coverage. However, we may be able to find a substitute medication at lower cost or fill out paperwork to get insurance to cover a needed medication.   If a prior authorization is required to get your medication covered by your insurance company, please allow [place] 1-2 business days to complete this process.  Drug prices often vary depending on where the prescription is filled and some pharmacies may offer cheaper prices.  The website www.goodrx.com contains coupons for medications through different pharmacies. The prices here do not account for what the cost may be with help from insurance (it may be cheaper with your insurance), but the website can give you the price if you did not use any insurance.  - You can print the associated coupon and take it with your prescription to the pharmacy.  - You may also stop by our office during regular business hours and pick up a GoodRx coupon card.  - If you need your prescription sent electronically to a different pharmacy, notify our office through [facility] or by phone at [phone] option 4.     Si Usted Necesita Algo Despus de Su Visita  Tambin puede enviarnos un mensaje a travs de Clinical cytogeneticist. Por lo general respondemos a los mensajes de MyChart en el transcurso de 1 a 2 das hbiles.  Para renovar recetas, por favor pida a su farmacia que se ponga en contacto con nuestra oficina. Annie Sable de fax es [place] [phone].  Si tiene un asunto urgente cuando la clnica est cerrada y que no puede esperar hasta el siguiente da hbil, puede llamar/localizar a su doctor(a) al nmero que aparece a continuacin.   Por favor, tenga en cuenta que aunque hacemos todo lo posible para estar disponibles para asuntos urgentes fuera del horario de [place], no estamos  disponibles las 24 horas del da, los 7 [address]  [address] de la [place].   Si tiene un problema urgente y no puede comunicarse con nosotros, puede optar por buscar atencin mdica  en el consultorio de su doctor(a), en una clnica privada, en un centro de atencin urgente o en una sala de emergencias.  Si tiene Engineer, drilling, por favor llame inmediatamente al 911 o vaya a la sala de emergencias.  Nmeros de bper  - Dr. Gwen Pounds: [phone]  - Dra. Roseanne Reno: [phone]  - Dr. Katrinka Blazing: [phone]   En caso de inclemencias del tiempo, por favor llame a Lacy Duverney principal al [phone] para una actualizacin sobre el [place] de cualquier retraso o cierre.  Consejos para la medicacin en dermatologa: Por favor, guarde las cajas en las que vienen los medicamentos de uso tpico para ayudarle a seguir las instrucciones sobre dnde y cmo usarlos. Las farmacias generalmente imprimen las instrucciones del medicamento slo en las  cajas y no directamente en los tubos del medicamento.   Si su medicamento es muy caro, por favor, pngase en contacto con Rolm Gala llamando al [phone] y presione la opcin 4 o envenos un mensaje a travs de Clinical cytogeneticist.   No podemos decirle cul ser su copago por los medicamentos por adelantado ya que esto es diferente dependiendo de la cobertura de su seguro. Sin embargo, es posible que podamos encontrar un medicamento sustituto a Audiological scientist un formulario para que el seguro cubra el medicamento que se considera necesario.   Si se requiere una autorizacin previa para que su compaa de seguros [place] su medicamento, por favor permtanos de 1 a 2 das hbiles para completar [address]  Los precios de los medicamentos varan con frecuencia dependiendo del Environmental consultant de dnde se surte la receta y alguna farmacias pueden ofrecer precios ms baratos.  El sitio web www.goodrx.com tiene cupones para medicamentos de Health and safety inspector. Los precios aqu no  tienen en cuenta lo que podra costar con la ayuda del seguro (puede ser ms barato con su seguro), pero el sitio web puede darle el precio si no utiliz Tourist information centre manager.  - Puede imprimir el cupn correspondiente y llevarlo con su receta a la farmacia.  - Tambin puede pasar por nuestra oficina durante el horario de atencin regular y Education officer, museum una tarjeta de cupones de GoodRx.  - Si necesita que su receta se enve electrnicamente a una farmacia diferente, informe a nuestra oficina a travs de MyChart de [place] o por telfono llamando al [phone] y presione la opcin 4.

## 2023-11-04 DIAGNOSIS — Z124 Encounter for screening for malignant neoplasm of cervix: Secondary | ICD-10-CM | POA: Diagnosis not present

## 2023-11-04 DIAGNOSIS — Z6832 Body mass index (BMI) 32.0-32.9, adult: Secondary | ICD-10-CM | POA: Diagnosis not present

## 2023-11-04 DIAGNOSIS — Z01419 Encounter for gynecological examination (general) (routine) without abnormal findings: Secondary | ICD-10-CM | POA: Diagnosis not present

## 2023-11-27 ENCOUNTER — Ambulatory Visit
Admission: EM | Admit: 2023-11-27 | Discharge: 2023-11-27 | Disposition: A | Discharge status: Home / Self Care | Attending: Emergency Medicine | Admitting: Emergency Medicine

## 2023-11-27 DIAGNOSIS — R053 Chronic cough: Secondary | ICD-10-CM | POA: Insufficient documentation

## 2023-11-27 DIAGNOSIS — J029 Acute pharyngitis, unspecified: Secondary | ICD-10-CM | POA: Insufficient documentation

## 2023-11-27 LAB — POCT RAPID STREP A (OFFICE): Rapid Strep A Screen: NEGATIVE

## 2023-11-27 MED ORDER — BENZONATATE 100 MG PO CAPS: 100.0000 mg | ORAL_CAPSULE | Freq: Three times a day (TID) | ORAL | 0 refills | Status: AC | PRN

## 2023-11-27 MED ORDER — PROMETHAZINE-DM 6.25-15 MG/5ML PO SYRP: 5.0000 mL | ORAL_SOLUTION | Freq: Four times a day (QID) | ORAL | 0 refills | Status: AC | PRN

## 2023-11-27 MED ORDER — ALBUTEROL SULFATE HFA 108 (90 BASE) MCG/ACT IN AERS: 2.0000 | INHALATION_SPRAY | Freq: Four times a day (QID) | RESPIRATORY_TRACT | 1 refills | Status: AC | PRN

## 2023-11-27 NOTE — ED Triage Notes (Signed)
 Pt states cough for the past 2 weeks,sore throat  and right ear pain. States her daughter tested positive for strep and she would like to be tested.

## 2023-11-27 NOTE — Discharge Instructions (Signed)
 Inhaler can be used 2-3 times daily for the next several days  The tessalon cough pills can be taken 3x daily  The promethazine DM cough syrup can be used up to 4 times daily. If this medication makes you drowsy, take only once before bed.  If for some reason cough worsens please return!  We will call you if anything grows on your throat culture (1-3 days)

## 2023-11-27 NOTE — ED Provider Notes (Signed)
 [facility]    CSN: [id number] Arrival date & time: 11/27/23  1101      History   Chief Complaint Chief Complaint  Patient presents with   Cough    HPI Tina Wilson is a 38 y.o. female.  2 weeks of persistent dry cough. No shortness of breath, wheezing, chest tightness. Reports history of bronchitis, used to have inhaler that would help. No fever or chills Has used throat lozenges  About 3 days ago developed sore throat and left ear pain Daughter was positive for strep. She would like a test today  Past Medical History:  Diagnosis Date   Allergy    Anxiety    Asthma    Depression    Dysplastic nevus 12/20/2021   right mid back paraspinal - moderate - recurrent 12/25/2022   Herpes genitalis in women 2010   Migraine    Thrombocytopenic disorder [facility])     Patient Active Problem List   Diagnosis Date Noted   Right upper quadrant abdominal pain    Choledocholithiasis 09/30/2020   Increased liver enzymes    Term pregnancy 12/01/2019   Rhinosinusitis 06/01/2019   Thrombocytopenia (HCC) 08/04/2018   Elevated LDL cholesterol level 08/04/2018   Sore throat 07/20/2018   Cough 07/20/2018   Migraine without aura and without status migrainosus, not intractable 06/23/2018   Post-dates pregnancy 10/30/2016   Healthcare maintenance 01/11/2013    Past Surgical History:  Procedure Laterality Date   CESAREAN SECTION N/A 12/01/2019   Procedure: CESAREAN SECTION;  Surgeon: Harold Hedge, MD;  Location: [facility] LD ORS;  Service: Obstetrics;  Laterality: N/A;   ENDOSCOPIC RETROGRADE CHOLANGIOPANCREATOGRAPHY (ERCP) WITH PROPOFOL N/A 09/30/2020   Procedure: ENDOSCOPIC RETROGRADE CHOLANGIOPANCREATOGRAPHY (ERCP) WITH PROPOFOL;  Surgeon: Midge Minium, MD;  Location: [facility] ENDOSCOPY;  Service: Endoscopy;  Laterality: N/A;   NO PAST SURGERIES      OB History     Gravida  2   Para  2   Term  2   Preterm      AB      Living  2      SAB      IAB       Ectopic      Multiple  0   Live Births  2            Home Medications    Prior to Admission medications   Medication Sig Start Date End Date Taking? Authorizing Provider  albuterol (VENTOLIN HFA) 108 (90 Base) MCG/ACT inhaler Inhale 2 puffs into the lungs every 6 (six) hours as needed for wheezing or shortness of breath. 11/27/23  Yes Darcee Dekker, Lurena Joiner, PA-C  benzonatate (TESSALON) 100 MG capsule Take 1 capsule (100 mg total) by mouth 3 (three) times daily as needed for cough. 11/27/23  Yes Arilynn Blakeney, Lurena Joiner, PA-C  promethazine-dextromethorphan (PROMETHAZINE-DM) 6.25-15 MG/5ML syrup Take 5 mLs by mouth 4 (four) times daily as needed for cough. 11/27/23  Yes Dameshia Seybold, Lurena Joiner, PA-C  FLUoxetine (PROZAC) 20 MG capsule TAKE 1 CAPSULE BY MOUTH EVERY DAY 03/21/22   Mayer Masker, PA-C    Family History Family History  Problem Relation Age of Onset   Alcohol abuse Maternal Grandmother    Breast cancer Paternal Grandmother    Alcohol abuse Paternal Grandfather    Arthritis Mother    Depression Mother    Hyperlipidemia Father    Hypertension Father    Alcohol abuse Paternal Aunt    Heart attack Maternal Grandfather    Stroke Maternal Grandfather  Cancer Neg Hx    COPD Neg Hx    Diabetes Neg Hx    Drug abuse Neg Hx    Early death Neg Hx    Heart disease Neg Hx    Kidney disease Neg Hx     Social History Social History   Tobacco Use   Smoking status: Never   Smokeless tobacco: Never  Vaping Use   Vaping status: Never Used  Substance Use Topics   Alcohol use: Yes    Alcohol/week: 14.0 standard drinks of alcohol    Types: 10 Glasses of wine, 4 Standard drinks or equivalent per week   Drug use: No    Comment: valtrex     Allergies   Patient has no known allergies.   Review of Systems Review of Systems  Respiratory:  Positive for cough.    Per HPI  Physical Exam Triage Vital Signs ED Triage Vitals [11/27/23 1126]  Encounter Vitals Group     BP (!) 126/90      Systolic BP Percentile      Diastolic BP Percentile      Pulse Rate 82     Resp 16     Temp 98.1 F (36.7 C)     Temp Source Oral     SpO2 97 %     Weight      Height      Head Circumference      Peak Flow      Pain Score 0     Pain Loc      Pain Education      Exclude from Growth Chart    No data found.  Updated Vital Signs BP (!) 126/90 (BP Location: Left Arm)   Pulse 82   Temp 98.1 F (36.7 C) (Oral)   Resp 16   LMP 10/30/2023 (Approximate)   SpO2 97%   Visual Acuity Right Eye Distance:   Left Eye Distance:   Bilateral Distance:    Right Eye Near:   Left Eye Near:    Bilateral Near:     Physical Exam Vitals and nursing note reviewed.  Constitutional:      Appearance: She is not ill-appearing.  HENT:     Right Ear: Tympanic membrane and ear canal normal.     Left Ear: Tympanic membrane and ear canal normal.     Nose: No congestion or rhinorrhea.     Mouth/Throat:     Mouth: Mucous membranes are moist.     Pharynx: Oropharynx is clear. No posterior oropharyngeal erythema.  Eyes:     Conjunctiva/sclera: Conjunctivae normal.  Cardiovascular:     Rate and Rhythm: Normal rate and regular rhythm.     Pulses: Normal pulses.     Heart sounds: Normal heart sounds.  Pulmonary:     Effort: Pulmonary effort is normal. No respiratory distress.     Breath sounds: Normal breath sounds. No wheezing, rhonchi or rales.  Musculoskeletal:     Cervical back: Normal range of motion.  Lymphadenopathy:     Cervical: No cervical adenopathy.  Skin:    General: Skin is warm and dry.  Neurological:     Mental Status: She is alert and oriented to person, place, and time.      UC Treatments / Results  Labs (all labs ordered are listed, but only abnormal results are displayed) Labs Reviewed  POCT RAPID STREP A (OFFICE)    EKG   Radiology No results found.  Procedures Procedures (including critical care  time)  Medications Ordered in UC Medications - No data to  display  Initial Impression / Assessment and Plan / UC Course  I have reviewed the triage vital signs and the nursing notes.  Pertinent labs & imaging results that were available during my care of the patient were reviewed by me and considered in my medical decision making (see chart for details).  Afebrile, well-appearing, clear lungs throughout, satting 97% room air. Sent inhaler to use 2 or 3 times daily as this has helped her in the past.  Also recommend trying Tessalon and Promethazine DM.  Rapid strep negative.  Culture is pending, will treat positive if needed  Advised return precautions Patient agrees to plan, no questions  Final Clinical Impressions(s) / UC Diagnoses   Final diagnoses:  Persistent cough  Sore throat     Discharge Instructions      Inhaler can be used 2-3 times daily for the next several days  The tessalon cough pills can be taken 3x daily  The promethazine DM cough syrup can be used up to 4 times daily. If this medication makes you drowsy, take only once before bed.  If for some reason cough worsens please return!  We will call you if anything grows on your throat culture (1-3 days)     ED Prescriptions     Medication Sig Dispense Auth. Provider   albuterol (VENTOLIN HFA) 108 (90 Base) MCG/ACT inhaler Inhale 2 puffs into the lungs every 6 (six) hours as needed for wheezing or shortness of breath. 8 g Faiga Stones, PA-C   benzonatate (TESSALON) 100 MG capsule Take 1 capsule (100 mg total) by mouth 3 (three) times daily as needed for cough. 30 capsule Amariah Kierstead, PA-C   promethazine-dextromethorphan (PROMETHAZINE-DM) 6.25-15 MG/5ML syrup Take 5 mLs by mouth 4 (four) times daily as needed for cough. 240 mL Jalasia Eskridge, Lurena Joiner, PA-C      PDMP not reviewed this encounter.   Taci Sterling, Lurena Joiner, [place] 11/27/23 1352

## 2023-11-30 LAB — CULTURE, GROUP A STREP (THRC)

## 2023-12-25 ENCOUNTER — Ambulatory Visit: Payer: BC Managed Care – PPO | Admitting: Dermatology

## 2024-01-08 ENCOUNTER — Encounter: Payer: Self-pay | Admitting: Dermatology

## 2024-01-08 ENCOUNTER — Ambulatory Visit: Admitting: Dermatology

## 2024-01-08 DIAGNOSIS — Z7189 Other specified counseling: Secondary | ICD-10-CM

## 2024-01-08 DIAGNOSIS — L814 Other melanin hyperpigmentation: Secondary | ICD-10-CM

## 2024-01-08 DIAGNOSIS — W908XXA Exposure to other nonionizing radiation, initial encounter: Secondary | ICD-10-CM

## 2024-01-08 DIAGNOSIS — Z86018 Personal history of other benign neoplasm: Secondary | ICD-10-CM

## 2024-01-08 DIAGNOSIS — D224 Melanocytic nevi of scalp and neck: Secondary | ICD-10-CM

## 2024-01-08 DIAGNOSIS — D229 Melanocytic nevi, unspecified: Secondary | ICD-10-CM

## 2024-01-08 DIAGNOSIS — L578 Other skin changes due to chronic exposure to nonionizing radiation: Secondary | ICD-10-CM | POA: Diagnosis not present

## 2024-01-08 DIAGNOSIS — L719 Rosacea, unspecified: Secondary | ICD-10-CM

## 2024-01-08 DIAGNOSIS — Z1283 Encounter for screening for malignant neoplasm of skin: Secondary | ICD-10-CM

## 2024-01-08 DIAGNOSIS — L821 Other seborrheic keratosis: Secondary | ICD-10-CM

## 2024-01-08 NOTE — Patient Instructions (Signed)

## 2024-01-08 NOTE — Progress Notes (Signed)
 Follow-Up Visit   Subjective  Tina Wilson is a 38 y.o. female who presents for the following: Skin Cancer Screening and Full Body Skin Exam, hx of Dysplastic nevus.   The patient presents for Total-Body Skin Exam (TBSE) for skin cancer screening and mole check. The patient has spots, moles and lesions to be evaluated, some may be new or changing and the patient may have concern these could be cancer.  The following portions of the chart were reviewed this encounter and updated as appropriate: medications, allergies, medical history  Review of Systems:  No other skin or systemic complaints except as noted in HPI or Assessment and Plan.  Objective  Well appearing patient in no apparent distress; mood and affect are within normal limits.  A full examination was performed including scalp, head, eyes, ears, nose, lips, neck, chest, axillae, abdomen, back, buttocks, bilateral upper extremities, bilateral lower extremities, hands, feet, fingers, toes, fingernails, and toenails. All findings within normal limits unless otherwise noted below.   Relevant physical exam findings are noted in the Assessment and Plan.         Assessment & Plan   SKIN CANCER SCREENING PERFORMED TODAY.  ACTINIC DAMAGE - Chronic condition, secondary to cumulative UV/sun exposure - diffuse scaly erythematous macules with underlying dyspigmentation - Recommend daily broad spectrum sunscreen SPF 30+ to sun-exposed areas, reapply every 2 hours as needed.  - Staying in the shade or wearing long sleeves, sun glasses (UVA+UVB protection) and wide brim hats (4-inch brim around the entire circumference of the hat) are also recommended for sun protection.  - Call for new or changing lesions.  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 Currently well-controlled.  Rosacea is a chronic progressive skin condition usually affecting the face of adults, causing redness and/or acne bumps. It is treatable but not curable. It sometimes affects the eyes (ocular rosacea) as well. It may respond to topical and/or systemic medication and can flare with stress, sun exposure, alcohol, exercise, topical steroids (including hydrocortisone/cortisone 10) and some foods.  Daily application of broad spectrum spf 30+ sunscreen to face is recommended to reduce flares. Treatment Plan Counseling for BBL / IPL / Laser and Coordination of Care Discussed the treatment option of Broad Band Light (BBL) /Intense Pulsed Light (IPL)/ Laser for skin discoloration, including brown spots and redness.  Typically we recommend at least 1-3 treatment sessions about 5-8 weeks apart for best results.  Cannot have tanned skin when BBL performed, and regular use of sunscreen/photoprotection is advised after the procedure to help maintain results. The patient's condition may also require "maintenance treatments" in the future.  The fee for BBL / laser treatments is $350 per treatment session for the whole face.  A fee can be quoted for other parts of the body.  Insurance typically does not pay for BBL/laser treatments and therefore the fee is an out-of-pocket cost. Recommend prophylactic valtrex treatment. Once scheduled for procedure, will send Rx in prior to patient's appointment.  No treatment needed     MELANOCYTIC NEVI Right scalp ~0.5 cm brown papule  - Benign appearing on exam today - Observation Photo taken  - Call clinic for new or changing moles - Recommend daily use of broad spectrum spf 30+ sunscreen to sun-exposed areas.    HISTORY OF DYSPLASTIC NEVUS Right mid  back paraspinal  No evidence of recurrence today Recommend regular full body skin exams Recommend daily broad spectrum sunscreen SPF 30+ to sun-exposed areas, reapply every 2  hours as needed.  Call if any new or changing lesions are noted between office visits    Return in about 1 year (around 01/07/2025) for TBSE, hx of Dysplastic nevus .  IClara Crisp, CMA, am acting as scribe for Celine Collard, MD .   Documentation: I have reviewed the above documentation for accuracy and completeness, and I agree with the above.  Celine Collard, MD

## 2025-01-11 ENCOUNTER — Ambulatory Visit: Admitting: Dermatology
